# Patient Record
Sex: Female | Born: 1965 | Race: White | Hispanic: No | Marital: Married | Smoking: Never smoker
Health system: Southern US, Community
[De-identification: ages and names within clinical notes are randomized; demographics above are authoritative.]

## PROBLEM LIST (undated history)

## (undated) DIAGNOSIS — I350 Nonrheumatic aortic (valve) stenosis: Secondary | ICD-10-CM

## (undated) DIAGNOSIS — M1990 Unspecified osteoarthritis, unspecified site: Secondary | ICD-10-CM

## (undated) DIAGNOSIS — G4733 Obstructive sleep apnea (adult) (pediatric): Secondary | ICD-10-CM

## (undated) DIAGNOSIS — E7800 Pure hypercholesterolemia, unspecified: Secondary | ICD-10-CM

## (undated) DIAGNOSIS — I10 Essential (primary) hypertension: Secondary | ICD-10-CM

## (undated) DIAGNOSIS — R011 Cardiac murmur, unspecified: Secondary | ICD-10-CM

## (undated) DIAGNOSIS — Z9989 Dependence on other enabling machines and devices: Secondary | ICD-10-CM

## (undated) DIAGNOSIS — Z95810 Presence of automatic (implantable) cardiac defibrillator: Secondary | ICD-10-CM

## (undated) DIAGNOSIS — Q249 Congenital malformation of heart, unspecified: Secondary | ICD-10-CM

## (undated) DIAGNOSIS — I2699 Other pulmonary embolism without acute cor pulmonale: Secondary | ICD-10-CM

## (undated) HISTORY — PX: CARDIAC CATHETERIZATION: SHX172

## (undated) HISTORY — PX: AORTIC VALVE REPLACEMENT: SHX41

## (undated) HISTORY — PX: CARDIAC VALVE REPLACEMENT: SHX585

## (undated) HISTORY — DX: Essential (primary) hypertension: I10

---
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
## 2000-03-16 ENCOUNTER — Other Ambulatory Visit: Admission: RE | Admit: 2000-03-16 | Discharge: 2000-03-16 | Payer: Self-pay | Admitting: Obstetrics and Gynecology

## 2001-05-07 ENCOUNTER — Other Ambulatory Visit: Admission: RE | Admit: 2001-05-07 | Discharge: 2001-05-07 | Payer: Self-pay | Admitting: Obstetrics and Gynecology

## 2002-05-08 ENCOUNTER — Other Ambulatory Visit: Admission: RE | Admit: 2002-05-08 | Discharge: 2002-05-08 | Payer: Self-pay | Admitting: Obstetrics and Gynecology

## 2003-06-03 ENCOUNTER — Other Ambulatory Visit: Admission: RE | Admit: 2003-06-03 | Discharge: 2003-06-03 | Payer: Self-pay | Admitting: Obstetrics and Gynecology

## 2004-06-08 ENCOUNTER — Other Ambulatory Visit: Admission: RE | Admit: 2004-06-08 | Discharge: 2004-06-08 | Payer: Self-pay | Admitting: Obstetrics and Gynecology

## 2004-11-13 HISTORY — PX: AORTIC VALVE REPLACEMENT: SHX41

## 2004-11-25 ENCOUNTER — Ambulatory Visit (HOSPITAL_COMMUNITY): Admission: RE | Admit: 2004-11-25 | Discharge: 2004-11-25 | Payer: Self-pay | Admitting: Family Medicine

## 2005-06-01 ENCOUNTER — Inpatient Hospital Stay (HOSPITAL_COMMUNITY): Admission: RE | Admit: 2005-06-01 | Discharge: 2005-06-01 | Payer: Self-pay | Admitting: Obstetrics and Gynecology

## 2005-08-30 ENCOUNTER — Other Ambulatory Visit: Admission: RE | Admit: 2005-08-30 | Discharge: 2005-08-30 | Payer: Self-pay | Admitting: Obstetrics and Gynecology

## 2005-09-27 ENCOUNTER — Ambulatory Visit (HOSPITAL_COMMUNITY): Admission: RE | Admit: 2005-09-27 | Discharge: 2005-09-27 | Payer: Self-pay | Admitting: Obstetrics and Gynecology

## 2006-08-30 ENCOUNTER — Ambulatory Visit (HOSPITAL_COMMUNITY): Admission: RE | Admit: 2006-08-30 | Discharge: 2006-08-30 | Payer: Self-pay | Admitting: Obstetrics and Gynecology

## 2006-11-14 ENCOUNTER — Ambulatory Visit (HOSPITAL_COMMUNITY): Admission: RE | Admit: 2006-11-14 | Discharge: 2006-11-14 | Payer: Self-pay | Admitting: Obstetrics and Gynecology

## 2006-11-17 ENCOUNTER — Inpatient Hospital Stay (HOSPITAL_COMMUNITY): Admission: AD | Admit: 2006-11-17 | Discharge: 2006-11-20 | Payer: Self-pay | Admitting: Obstetrics and Gynecology

## 2013-03-05 ENCOUNTER — Other Ambulatory Visit: Payer: Self-pay | Admitting: Obstetrics and Gynecology

## 2013-11-13 DIAGNOSIS — Z95810 Presence of automatic (implantable) cardiac defibrillator: Secondary | ICD-10-CM | POA: Insufficient documentation

## 2013-11-13 DIAGNOSIS — I2699 Other pulmonary embolism without acute cor pulmonale: Secondary | ICD-10-CM | POA: Insufficient documentation

## 2013-11-13 HISTORY — PX: CARDIAC DEFIBRILLATOR PLACEMENT: SHX171

## 2013-11-13 HISTORY — DX: Presence of automatic (implantable) cardiac defibrillator: Z95.810

## 2013-11-13 HISTORY — PX: ANOMALOUS PULMONARY VENOUS RETURN REPAIR, TOTAL: SHX1156

## 2013-11-13 HISTORY — DX: Other pulmonary embolism without acute cor pulmonale: I26.99

## 2014-05-14 ENCOUNTER — Other Ambulatory Visit: Payer: Self-pay | Admitting: Obstetrics and Gynecology

## 2014-05-18 LAB — CYTOLOGY - PAP

## 2014-09-18 DIAGNOSIS — Z952 Presence of prosthetic heart valve: Secondary | ICD-10-CM | POA: Insufficient documentation

## 2014-09-18 DIAGNOSIS — I351 Nonrheumatic aortic (valve) insufficiency: Secondary | ICD-10-CM | POA: Insufficient documentation

## 2014-09-18 DIAGNOSIS — Z954 Presence of other heart-valve replacement: Secondary | ICD-10-CM

## 2014-09-18 HISTORY — DX: Nonrheumatic aortic (valve) insufficiency: I35.1

## 2014-09-18 HISTORY — DX: Presence of prosthetic heart valve: Z95.2

## 2014-10-28 DIAGNOSIS — I350 Nonrheumatic aortic (valve) stenosis: Secondary | ICD-10-CM | POA: Insufficient documentation

## 2014-10-28 HISTORY — DX: Nonrheumatic aortic (valve) stenosis: I35.0

## 2014-10-29 DIAGNOSIS — T82897A Other specified complication of cardiac prosthetic devices, implants and grafts, initial encounter: Secondary | ICD-10-CM

## 2014-10-29 HISTORY — DX: Other specified complication of cardiac prosthetic devices, implants and grafts, initial encounter: T82.897A

## 2014-11-03 ENCOUNTER — Emergency Department (HOSPITAL_COMMUNITY): Payer: BLUE CROSS/BLUE SHIELD

## 2014-11-03 ENCOUNTER — Inpatient Hospital Stay (HOSPITAL_COMMUNITY): Payer: BLUE CROSS/BLUE SHIELD

## 2014-11-03 ENCOUNTER — Inpatient Hospital Stay (HOSPITAL_COMMUNITY)
Admission: EM | Admit: 2014-11-03 | Discharge: 2014-11-03 | DRG: 297 | Disposition: A | Discharge status: Other Acute Inpatient Hospital | Payer: BLUE CROSS/BLUE SHIELD | Attending: Emergency Medicine | Admitting: Emergency Medicine

## 2014-11-03 ENCOUNTER — Encounter (HOSPITAL_COMMUNITY): Payer: Self-pay | Admitting: *Deleted

## 2014-11-03 DIAGNOSIS — Z452 Encounter for adjustment and management of vascular access device: Secondary | ICD-10-CM

## 2014-11-03 DIAGNOSIS — I469 Cardiac arrest, cause unspecified: Secondary | ICD-10-CM

## 2014-11-03 DIAGNOSIS — J9601 Acute respiratory failure with hypoxia: Secondary | ICD-10-CM

## 2014-11-03 DIAGNOSIS — E872 Acidosis: Secondary | ICD-10-CM | POA: Diagnosis present

## 2014-11-03 DIAGNOSIS — R0902 Hypoxemia: Secondary | ICD-10-CM | POA: Diagnosis present

## 2014-11-03 DIAGNOSIS — N390 Urinary tract infection, site not specified: Secondary | ICD-10-CM

## 2014-11-03 DIAGNOSIS — Z952 Presence of prosthetic heart valve: Secondary | ICD-10-CM | POA: Diagnosis not present

## 2014-11-03 DIAGNOSIS — J810 Acute pulmonary edema: Secondary | ICD-10-CM

## 2014-11-03 DIAGNOSIS — Z9289 Personal history of other medical treatment: Secondary | ICD-10-CM

## 2014-11-03 DIAGNOSIS — R4182 Altered mental status, unspecified: Secondary | ICD-10-CM | POA: Diagnosis present

## 2014-11-03 DIAGNOSIS — I4901 Ventricular fibrillation: Secondary | ICD-10-CM | POA: Diagnosis present

## 2014-11-03 HISTORY — DX: Congenital malformation of heart, unspecified: Q24.9

## 2014-11-03 HISTORY — DX: Cardiac arrest, cause unspecified: I46.9

## 2014-11-03 LAB — PROTIME-INR
INR: 1.15 (ref 0.00–1.49)
Prothrombin Time: 14.8 seconds (ref 11.6–15.2)

## 2014-11-03 LAB — COMPREHENSIVE METABOLIC PANEL
ALK PHOS: 76 U/L (ref 39–117)
ALT: 219 U/L — AB (ref 0–35)
AST: 327 U/L — AB (ref 0–37)
Albumin: 4 g/dL (ref 3.5–5.2)
Anion gap: 19 — ABNORMAL HIGH (ref 5–15)
BILIRUBIN TOTAL: 0.7 mg/dL (ref 0.3–1.2)
BUN: 17 mg/dL (ref 6–23)
CO2: 20 mmol/L (ref 19–32)
Calcium: 9.1 mg/dL (ref 8.4–10.5)
Chloride: 102 mEq/L (ref 96–112)
Creatinine, Ser: 1.45 mg/dL — ABNORMAL HIGH (ref 0.50–1.10)
GFR, EST AFRICAN AMERICAN: 49 mL/min — AB (ref >90)
GFR, EST NON AFRICAN AMERICAN: 42 mL/min — AB (ref >90)
Glucose, Bld: 267 mg/dL — ABNORMAL HIGH (ref 70–99)
POTASSIUM: 3.1 mmol/L — AB (ref 3.5–5.1)
Sodium: 141 mmol/L (ref 135–145)
TOTAL PROTEIN: 7 g/dL (ref 6.0–8.3)

## 2014-11-03 LAB — I-STAT TROPONIN, ED: TROPONIN I, POC: 0.28 ng/mL — AB (ref 0.00–0.08)

## 2014-11-03 LAB — I-STAT CHEM 8, ED
BUN: 17 mg/dL (ref 6–23)
CALCIUM ION: 1.08 mmol/L — AB (ref 1.12–1.23)
CREATININE: 1.3 mg/dL — AB (ref 0.50–1.10)
Chloride: 104 mEq/L (ref 96–112)
Glucose, Bld: 264 mg/dL — ABNORMAL HIGH (ref 70–99)
HEMATOCRIT: 40 % (ref 36.0–46.0)
Hemoglobin: 13.6 g/dL (ref 12.0–15.0)
POTASSIUM: 3.1 mmol/L — AB (ref 3.5–5.1)
SODIUM: 141 mmol/L (ref 135–145)
TCO2: 18 mmol/L (ref 0–100)

## 2014-11-03 LAB — I-STAT CG4 LACTIC ACID, ED: Lactic Acid, Venous: 7.66 mmol/L — ABNORMAL HIGH (ref 0.5–2.2)

## 2014-11-03 LAB — CBG MONITORING, ED: Glucose-Capillary: 198 mg/dL — ABNORMAL HIGH (ref 70–99)

## 2014-11-03 LAB — URINE MICROSCOPIC-ADD ON

## 2014-11-03 LAB — I-STAT ARTERIAL BLOOD GAS, ED
Acid-base deficit: 5 mmol/L — ABNORMAL HIGH (ref 0.0–2.0)
BICARBONATE: 24.5 meq/L — AB (ref 20.0–24.0)
O2 Saturation: 98 %
PCO2 ART: 66.1 mmHg — AB (ref 35.0–45.0)
PH ART: 7.181 — AB (ref 7.350–7.450)
Patient temperature: 100.1
TCO2: 26 mmol/L (ref 0–100)
pO2, Arterial: 128 mmHg — ABNORMAL HIGH (ref 80.0–100.0)

## 2014-11-03 LAB — URINALYSIS, ROUTINE W REFLEX MICROSCOPIC
BILIRUBIN URINE: NEGATIVE
GLUCOSE, UA: 250 mg/dL — AB
Ketones, ur: NEGATIVE mg/dL
Leukocytes, UA: NEGATIVE
NITRITE: NEGATIVE
PROTEIN: 100 mg/dL — AB
SPECIFIC GRAVITY, URINE: 1.015 (ref 1.005–1.030)
UROBILINOGEN UA: 1 mg/dL (ref 0.0–1.0)
pH: 5.5 (ref 5.0–8.0)

## 2014-11-03 LAB — CBC WITH DIFFERENTIAL/PLATELET
BASOS PCT: 1 % (ref 0–1)
Basophils Absolute: 0.1 10*3/uL (ref 0.0–0.1)
EOS ABS: 0 10*3/uL (ref 0.0–0.7)
EOS PCT: 0 % (ref 0–5)
HCT: 37.7 % (ref 36.0–46.0)
HEMOGLOBIN: 11.6 g/dL — AB (ref 12.0–15.0)
LYMPHS ABS: 5.3 10*3/uL — AB (ref 0.7–4.0)
Lymphocytes Relative: 22 % (ref 12–46)
MCH: 29.2 pg (ref 26.0–34.0)
MCHC: 30.8 g/dL (ref 30.0–36.0)
MCV: 95 fL (ref 78.0–100.0)
MONOS PCT: 7 % (ref 3–12)
Monocytes Absolute: 1.7 10*3/uL — ABNORMAL HIGH (ref 0.1–1.0)
NEUTROS ABS: 16.6 10*3/uL — AB (ref 1.7–7.7)
NEUTROS PCT: 70 % (ref 43–77)
PLATELETS: 437 10*3/uL — AB (ref 150–400)
RBC: 3.97 MIL/uL (ref 3.87–5.11)
RDW: 15.8 % — ABNORMAL HIGH (ref 11.5–15.5)
WBC: 23.6 10*3/uL — ABNORMAL HIGH (ref 4.0–10.5)

## 2014-11-03 MED ORDER — PROPOFOL 10 MG/ML IV EMUL
INTRAVENOUS | Status: AC
  Filled 2014-11-03: qty 100

## 2014-11-03 MED ORDER — DEXTROSE 5 % IV SOLN
2.0000 g | Freq: Once | INTRAVENOUS | Status: AC
  Administered 2014-11-03: 2 g via INTRAVENOUS
  Filled 2014-11-03: qty 2

## 2014-11-03 MED ORDER — PROPOFOL 10 MG/ML IV EMUL
5.0000 ug/kg/min | INTRAVENOUS | Status: DC
  Administered 2014-11-03: 20 ug/kg/min via INTRAVENOUS

## 2014-11-03 MED ORDER — ROCURONIUM BROMIDE 50 MG/5ML IV SOLN
INTRAVENOUS | Status: AC
  Filled 2014-11-03: qty 2

## 2014-11-03 MED ORDER — LIDOCAINE HCL (CARDIAC) 20 MG/ML IV SOLN
INTRAVENOUS | Status: AC
  Filled 2014-11-03: qty 5

## 2014-11-03 MED ORDER — ETOMIDATE 2 MG/ML IV SOLN
INTRAVENOUS | Status: AC | PRN
  Administered 2014-11-03: 20 mg via INTRAVENOUS
  Administered 2014-11-03: 10 mg via INTRAVENOUS
  Administered 2014-11-03: 30 mg via INTRAVENOUS

## 2014-11-03 MED ORDER — POTASSIUM CHLORIDE 10 MEQ/100ML IV SOLN: 10.0000 meq | Freq: Once | INTRAVENOUS | Status: DC

## 2014-11-03 MED ORDER — VANCOMYCIN HCL 10 G IV SOLR
2000.0000 mg | Freq: Once | INTRAVENOUS | Status: AC
  Administered 2014-11-03: 2000 mg via INTRAVENOUS
  Filled 2014-11-03: qty 2000

## 2014-11-03 MED ORDER — SUCCINYLCHOLINE CHLORIDE 20 MG/ML IJ SOLN
INTRAMUSCULAR | Status: AC
  Filled 2014-11-03: qty 1

## 2014-11-03 MED ORDER — ETOMIDATE 2 MG/ML IV SOLN
INTRAVENOUS | Status: AC
  Filled 2014-11-03: qty 20

## 2014-11-03 MED ORDER — ROCURONIUM BROMIDE 50 MG/5ML IV SOLN
INTRAVENOUS | Status: AC | PRN
  Administered 2014-11-03 (×2): 100 mg via INTRAVENOUS

## 2014-11-03 MED ORDER — PROPOFOL 10 MG/ML IV EMUL
5.0000 ug/kg/min | INTRAVENOUS | Status: DC
  Administered 2014-11-03: 10 ug/kg/min via INTRAVENOUS

## 2014-11-03 MED ORDER — PROPOFOL 10 MG/ML IV EMUL
INTRAVENOUS | Status: DC
Start: 2014-11-03 — End: 2014-11-03
  Filled 2014-11-03: qty 100

## 2014-11-03 MED ORDER — SODIUM CHLORIDE 0.9 % IV SOLN
25.0000 ug/h | INTRAVENOUS | Status: DC
  Administered 2014-11-03: 50 ug/h via INTRAVENOUS
  Filled 2014-11-03: qty 50

## 2014-11-03 NOTE — Progress Notes (Signed)
Patient placed on transport vent by EMS.

## 2014-11-03 NOTE — Code Documentation (Signed)
CBG 198 

## 2014-11-03 NOTE — Progress Notes (Signed)
Responded to referral from oon call night chaplain to continue support to patient that can to ED as post CPR. Patient is intubated and still critical. Family was escorted to consultation room and is rotating visits with patient.  Family has requested that patient be sent to [facility].  Bed placement pending. Family is very cooperative and supportive to staff. Provided ministry of presence, empathic listening, emotional and spiritual support and hospitality.  Will follow as needed.   11/03/14 1100  Clinical Encounter Type  Visited With Patient;Family;Patient and family together;Health care provider  Visit Type Follow-up;Spiritual support;Critical Care;ED;Trauma  Referral From Chaplain  Spiritual Encounters  Spiritual Needs Prayer;Emotional  Stress Factors  Family Stress Factors Exhausted;Health changes;Major life changes  Jaclynn Major, [place]

## 2014-11-03 NOTE — ED Notes (Signed)
Troponin results given to Dr. Alvino Chapel

## 2014-11-03 NOTE — ED Notes (Signed)
Family rotating through with chaplin.

## 2014-11-03 NOTE — ED Notes (Signed)
30mcg bolus given per dr Nelda Marseille

## 2014-11-03 NOTE — Code Documentation (Addendum)
Per EMS- pt was unwitness arrest. Family started CPR at approx 0700. Pt was last seen at 630 today. Pt received 200j at 718 for v fib with EMS the 300j pulses returned with NSR at 0727

## 2014-11-03 NOTE — Procedures (Signed)
Arterial Catheter Insertion Procedure Note KEYIRA MONDESIR [id number] 1966/07/12  Procedure: Insertion of Arterial Catheter  Indications: Blood pressure monitoring and Frequent blood sampling  Procedure Details Consent: Unable to obtain consent because of emergent medical necessity. Time Out: Verified patient identification, verified procedure, site/side was marked, verified correct patient position, special equipment/implants available, medications/allergies/relevent history reviewed, required imaging and test results available.  Performed  Maximum sterile technique was used including antiseptics, cap, gloves, gown, hand hygiene, mask and sheet. Skin prep: Chlorhexidine; local anesthetic administered 20 gauge catheter was inserted into left radial artery using the Seldinger technique.  Evaluation Blood flow good; BP tracing good. Complications: No apparent complications.   Jennet Maduro 11/03/2014

## 2014-11-03 NOTE — H&P (Signed)
Called to admit patient to ICU, patient's family wishes for patient to be transferred to [facility].  Evidently patient was doing ok last night but has been rather debilitated since her valve replacement last week in [facility].  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  This AM patient was noted to be agonally breathing and CPR was started by husband at home.  EMS was called and patient was not intubated.  Patient was brought to the hospital.  Was very agitated, not purposeful and was intubated.  I spoke with the family, explained situation in details and husband requested transfer to [facility].  Neuro exam bedside was that of a patient who was paralyzed.  Pupils were reactive but patient was not moving anything.  Lungs with diffuse crackles.  Heart sounds consistent with an aortic valve replacement.  Will place central line and a-line and EDP is to attempt transfer to [facility].  If unable to acquire a bed then will admit to [facility].  The patient is critically ill with multiple organ systems failure and requires high complexity decision making for assessment and support, frequent evaluation and titration of therapies, application of advanced monitoring technologies and extensive interpretation of multiple databases.   Critical Care Time devoted to patient care services described in this note is 45 Minutes. This time reflects time of care of this signee Dr Jennet Maduro. This critical care time does not reflect procedure time, or teaching time or supervisory time of PA/NP/Med student/Med Resident etc but could involve care discussion time.  Rush Farmer, M.D. [facility] Pulmonary/Critical Care Medicine. Pager: [phone]. After hours pager: [phone].

## 2014-11-03 NOTE — ED Notes (Signed)
Critical care MD at bedside 

## 2014-11-03 NOTE — ED Notes (Signed)
Notified RN of CBG 198

## 2014-11-03 NOTE — ED Notes (Signed)
Lactic acid results given to Dr. Pickering 

## 2014-11-03 NOTE — Code Documentation (Signed)
Pt continued to be bagged in preparation for intubation.

## 2014-11-03 NOTE — Code Documentation (Addendum)
Pt on nrb at this time with NPA in place. Respiratory at bedside. Pt moving all extremities. Response to painful stimuli. Pt arrived on LSB.

## 2014-11-03 NOTE — ED Notes (Signed)
Dr.Pickering made aware that receiving RN was asking if potassium could be given. MD states not at this time.

## 2014-11-03 NOTE — Code Documentation (Signed)
No response to initial doses of medications.

## 2014-11-03 NOTE — Progress Notes (Signed)
ANTIBIOTIC CONSULT NOTE - INITIAL  Pharmacy Consult for vancomycin and cefepime Indication: rule out sepsis  Allergies not on file  Patient Measurements: Weight: 300 lb (136.079 kg)   Vital Signs: Temp: 100.1 F (37.8 C) (12/22 0904) Temp Source: Rectal (12/22 0904) BP: 167/89 mmHg (12/22 0915) Pulse Rate: 115 (12/22 0929) Intake/Output from previous day:   Intake/Output from this shift:    Labs:  Recent Labs  11/03/14 0850 11/03/14 0903  WBC 23.6*  --   HGB 11.6* 13.6  PLT 437*  --   CREATININE 1.45* 1.30*   CrCl cannot be calculated (Unknown ideal weight.). No results for input(s): VANCOTROUGH, VANCOPEAK, VANCORANDOM, GENTTROUGH, GENTPEAK, GENTRANDOM, TOBRATROUGH, TOBRAPEAK, TOBRARND, AMIKACINPEAK, AMIKACINTROU, AMIKACIN in the last 72 hours.   Microbiology: No results found for this or any previous visit (from the past 720 hour(s)).  Medical History: No past medical history on file.  Medications:  See med hiastory Assessment: 48 yo lady admitted after unwitnessed arrest to start broad spectrum antibiotics r/o sepsis.  Tmax 100.1, WBC 23.6  LA 7.66, Trop 0.28  Goal of Therapy:  Vancomycin trough level 15-20 mcg/ml  Plan:  Cefepime 2 gm IV q24 hours Vancomycin 2 gm IV X 1 then 1500 mg IV q24 hours F/u renal function, cultures and clinical course  Thanks for allowing pharmacy to be a part of this patient's care.  Excell Seltzer, PharmD Clinical Pharmacist, [phone] 11/03/2014,9:45 AM

## 2014-11-03 NOTE — ED Notes (Signed)
Heather Mosley with critical care at bedside 

## 2014-11-03 NOTE — Procedures (Signed)
Central Venous Catheter Insertion Procedure Note Heather Mosley [id number] 12-May-1966  Procedure: Insertion of Central Venous Catheter Indications: Assessment of intravascular volume, Drug and/or fluid administration and Frequent blood sampling  Procedure Details Consent: Unable to obtain consent because of emergent medical necessity. Time Out: Verified patient identification, verified procedure, site/side was marked, verified correct patient position, special equipment/implants available, medications/allergies/relevent history reviewed, required imaging and test results available.  Performed  Maximum sterile technique was used including antiseptics, cap, gloves, gown, hand hygiene, mask and sheet. Skin prep: Chlorhexidine; local anesthetic administered A antimicrobial bonded/coated triple lumen catheter was placed in the left subclavian vein using the Seldinger technique.  Evaluation Blood flow good Complications: No apparent complications Patient did tolerate procedure well. Chest X-ray ordered to verify placement.  CXR: normal.  U/S used in placement.  Gracie Gupta 11/03/2014, 10:28 AM

## 2014-11-03 NOTE — ED Provider Notes (Signed)
CSN: [id number]     Arrival date & time 11/03/14  0820 History   First MD Initiated Contact with Patient 11/03/14 0840     Chief Complaint  Patient presents with  . post cardiac arrest     Level V caveat due to altered mental status. (Consider location/radiation/quality/duration/timing/severity/associated sxs/prior Treatment) The history is provided by the patient and the EMS personnel.   patient has had a recent aortic valve replacement at [facility] around 5 days ago. This morning she was normal at 6:30 and at 7:00 family found her without a pulse. EMS arrived around 20 minutes later and found to be in ventricular fibrillation. She required 2 shocks and did have return of vitals around 728. Upon arrival to the ER she was somewhat combative with pupils somewhat dilated and sluggish. She would not follow commands and would withdraw from pain. Initially good pulse ox and in sinus tachycardia.  Past Medical History  Diagnosis Date  . Congenital heart defect    Past Surgical History  Procedure Laterality Date  . Aortic valve surgery     No family history on file. History  Substance Use Topics  . Smoking status: Not on file  . Smokeless tobacco: Not on file  . Alcohol Use: Not on file   OB History    No data available     Review of Systems  Unable to perform ROS     Allergies  Tape  Home Medications   Prior to Admission medications   Not on File   BP 167/89 mmHg  Pulse 116  Temp(Src) 100.1 F (37.8 C) (Rectal)  Resp 20  Wt 300 lb (136.079 kg)  SpO2 94% Physical Exam  Constitutional: She appears well-developed and well-nourished.  HENT:  Head: Atraumatic.  Eyes:  Pupils somewhat dilated and sluggish.  Neck:  Band-Aid to right sided neck over slight ecchymosis, likely site of TAVR  Cardiovascular:  Tachycardia  Pulmonary/Chest:  Transmitted upper airway sounds equal bilaterally.  Abdominal: There is no tenderness.  Patient is obese  Musculoskeletal:  She exhibits no edema.  Neurological:  Patient is somewhat combative. Withdraws from pain. Is not follow commands and is nonverbal.  Skin: Skin is warm.    ED Course  Procedures (including critical care time) Labs Review Labs Reviewed  CBC WITH DIFFERENTIAL - Abnormal; Notable for the following:    WBC 23.6 (*)    Hemoglobin 11.6 (*)    RDW 15.8 (*)    Platelets 437 (*)    Neutro Abs 16.6 (*)    Lymphs Abs 5.3 (*)    Monocytes Absolute 1.7 (*)    All other components within normal limits  COMPREHENSIVE METABOLIC PANEL - Abnormal; Notable for the following:    Potassium 3.1 (*)    Glucose, Bld 267 (*)    Creatinine, Ser 1.45 (*)    AST 327 (*)    ALT 219 (*)    GFR calc non Af Amer 42 (*)    GFR calc Af Amer 49 (*)    Anion gap 19 (*)    All other components within normal limits  URINALYSIS, ROUTINE W REFLEX MICROSCOPIC - Abnormal; Notable for the following:    APPearance TURBID (*)    Glucose, UA 250 (*)    Hgb urine dipstick MODERATE (*)    Protein, ur 100 (*)    All other components within normal limits  URINE MICROSCOPIC-ADD ON - Abnormal; Notable for the following:    Squamous Epithelial / LPF  FEW (*)    Bacteria, UA MANY (*)    Casts GRANULAR CAST (*)    All other components within normal limits  I-STAT CHEM 8, ED - Abnormal; Notable for the following:    Potassium 3.1 (*)    Creatinine, Ser 1.30 (*)    Glucose, Bld 264 (*)    Calcium, Ion 1.08 (*)    All other components within normal limits  I-STAT TROPOININ, ED - Abnormal; Notable for the following:    Troponin i, poc 0.28 (*)    All other components within normal limits  I-STAT CG4 LACTIC ACID, ED - Abnormal; Notable for the following:    Lactic Acid, Venous 7.66 (*)    All other components within normal limits  CBG MONITORING, ED - Abnormal; Notable for the following:    Glucose-Capillary 198 (*)    All other components within normal limits  I-STAT ARTERIAL BLOOD GAS, ED - Abnormal; Notable for the  following:    pH, Arterial 7.181 (*)    pCO2 arterial 66.1 (*)    pO2, Arterial 128.0 (*)    Bicarbonate 24.5 (*)    Acid-base deficit 5.0 (*)    All other components within normal limits  CULTURE, BLOOD (ROUTINE X 2)  CULTURE, BLOOD (ROUTINE X 2)  PROTIME-INR  BLOOD GAS, ARTERIAL    Imaging Review Dg Chest Portable 1 View  11/03/2014   CLINICAL DATA:  Cardiac arrest  EXAM: PORTABLE CHEST - 1 VIEW  COMPARISON:  09/11/2014  FINDINGS: Endotracheal tube in good position.  NG tube in the stomach  Cardiac enlargement. Diffuse bilateral airspace disease most likely pulmonary edema. Negative for effusion.  IMPRESSION: Endotracheal tube in good position.  Diffuse pulmonary edema.   Electronically Signed   By: Franchot Gallo M.D.   On: 11/03/2014 09:20     EKG Interpretation None      MDM   Final diagnoses:  Cardiac arrest    Patient presented after cardiac arrest. Had return of vitals after V. fib arrest. Combative and required intubation by myself. Critical care consulted. Cefepime and vancomycin started for elevated white count and temperature 100.1. Urinalysis later showed UTI. Patient is acidotic. Discussed with family members. After line placement Vicryl care the request transfer to [facility]. I discussed with several different cardiologist there and will be transferred to the CCU. No antiarrhythmic started at this time. Is not started on hypothermia protocol after discussion with critical care. Potassium supplementation have been ordered, however we'll not delay the transfer for it.  INTUBATION Performed by: Mackie Pai  Required items: required blood products, implants, devices, and special equipment available Patient identity confirmed: provided demographic data and hospital-assigned identification number Time out: Immediately prior to procedure a "time out" was called to verify the correct patient, procedure, equipment, support staff and site/side marked as  required.  Indications: Altered mental status   Intubation method: Glidescope Laryngoscopy   Preoxygenation: BVM  Sedatives: Etomidate Paralytic: Roccuronium  hypoxia before intubation and after induction that resolved with bagging.  Tube Size: 7.5 cuffed  Post-procedure assessment: chest rise and ETCO2 monitor Breath sounds: equal and absent over the epigastrium Tube secured with: ETT holder Chest x-ray interpreted by radiologist and me.  Chest x-ray findings:  *endotracheal tube in appropriate position  Patient tolerated the procedure well with no immediate complications.  CRITICAL CARE Performed by: Mackie Pai Total critical care time: 45 Critical care time was exclusive of separately billable procedures and treating other patients. Critical care was necessary  to treat or prevent imminent or life-threatening deterioration. Critical care was time spent personally by me on the following activities: development of treatment plan with patient and/or surrogate as well as nursing, discussions with consultants, evaluation of patient's response to treatment, examination of patient, obtaining history from patient or surrogate, ordering and performing treatments and interventions, ordering and review of laboratory studies, ordering and review of radiographic studies, pulse oximetry and re-evaluation of patient's condition.       Jasper Riling. Alvino Chapel, MD 11/03/14 [phone]

## 2014-11-05 LAB — URINE CULTURE
COLONY COUNT: NO GROWTH
CULTURE: NO GROWTH

## 2014-11-09 DIAGNOSIS — Z95810 Presence of automatic (implantable) cardiac defibrillator: Secondary | ICD-10-CM | POA: Insufficient documentation

## 2014-11-09 DIAGNOSIS — I5023 Acute on chronic systolic (congestive) heart failure: Secondary | ICD-10-CM | POA: Insufficient documentation

## 2014-11-09 DIAGNOSIS — Z9889 Other specified postprocedural states: Secondary | ICD-10-CM | POA: Insufficient documentation

## 2014-11-09 HISTORY — DX: Acute on chronic systolic (congestive) heart failure: I50.23

## 2014-11-09 HISTORY — DX: Presence of automatic (implantable) cardiac defibrillator: Z95.810

## 2014-11-09 HISTORY — DX: Other specified postprocedural states: Z98.890

## 2014-11-09 LAB — CULTURE, BLOOD (ROUTINE X 2)
Culture: NO GROWTH
Culture: NO GROWTH

## 2015-06-02 DIAGNOSIS — E669 Obesity, unspecified: Secondary | ICD-10-CM | POA: Insufficient documentation

## 2015-06-02 HISTORY — DX: Obesity, unspecified: E66.9

## 2015-06-21 ENCOUNTER — Other Ambulatory Visit: Payer: Self-pay | Admitting: Obstetrics and Gynecology

## 2015-06-22 LAB — CYTOLOGY - PAP

## 2016-05-24 DIAGNOSIS — Z95810 Presence of automatic (implantable) cardiac defibrillator: Secondary | ICD-10-CM

## 2016-05-24 HISTORY — DX: Presence of automatic (implantable) cardiac defibrillator: Z95.810

## 2016-09-29 ENCOUNTER — Other Ambulatory Visit: Payer: Self-pay | Admitting: Obstetrics and Gynecology

## 2016-10-02 LAB — CYTOLOGY - PAP

## 2016-11-15 DIAGNOSIS — G5601 Carpal tunnel syndrome, right upper limb: Secondary | ICD-10-CM | POA: Diagnosis not present

## 2016-11-15 DIAGNOSIS — M25562 Pain in left knee: Secondary | ICD-10-CM | POA: Diagnosis not present

## 2016-11-15 DIAGNOSIS — G4733 Obstructive sleep apnea (adult) (pediatric): Secondary | ICD-10-CM | POA: Diagnosis not present

## 2016-11-22 DIAGNOSIS — Z1231 Encounter for screening mammogram for malignant neoplasm of breast: Secondary | ICD-10-CM | POA: Diagnosis not present

## 2016-11-23 DIAGNOSIS — I469 Cardiac arrest, cause unspecified: Secondary | ICD-10-CM | POA: Diagnosis not present

## 2016-11-23 DIAGNOSIS — Z95810 Presence of automatic (implantable) cardiac defibrillator: Secondary | ICD-10-CM | POA: Diagnosis not present

## 2016-12-25 DIAGNOSIS — M1711 Unilateral primary osteoarthritis, right knee: Secondary | ICD-10-CM | POA: Diagnosis not present

## 2017-01-08 DIAGNOSIS — M1711 Unilateral primary osteoarthritis, right knee: Secondary | ICD-10-CM | POA: Diagnosis not present

## 2017-01-31 DIAGNOSIS — Z95810 Presence of automatic (implantable) cardiac defibrillator: Secondary | ICD-10-CM | POA: Diagnosis not present

## 2017-01-31 DIAGNOSIS — Z952 Presence of prosthetic heart valve: Secondary | ICD-10-CM | POA: Diagnosis not present

## 2017-02-19 DIAGNOSIS — L03031 Cellulitis of right toe: Secondary | ICD-10-CM | POA: Diagnosis not present

## 2017-02-20 DIAGNOSIS — Z952 Presence of prosthetic heart valve: Secondary | ICD-10-CM | POA: Diagnosis not present

## 2017-02-20 DIAGNOSIS — Z95810 Presence of automatic (implantable) cardiac defibrillator: Secondary | ICD-10-CM | POA: Diagnosis not present

## 2017-03-07 DIAGNOSIS — Z45018 Encounter for adjustment and management of other part of cardiac pacemaker: Secondary | ICD-10-CM | POA: Diagnosis not present

## 2017-03-07 DIAGNOSIS — Z95810 Presence of automatic (implantable) cardiac defibrillator: Secondary | ICD-10-CM | POA: Diagnosis not present

## 2017-03-07 DIAGNOSIS — I469 Cardiac arrest, cause unspecified: Secondary | ICD-10-CM | POA: Diagnosis not present

## 2017-03-12 DIAGNOSIS — G4733 Obstructive sleep apnea (adult) (pediatric): Secondary | ICD-10-CM | POA: Diagnosis not present

## 2017-03-20 ENCOUNTER — Other Ambulatory Visit: Payer: Self-pay | Admitting: Orthopedic Surgery

## 2017-03-20 DIAGNOSIS — G8929 Other chronic pain: Secondary | ICD-10-CM | POA: Insufficient documentation

## 2017-03-20 DIAGNOSIS — R52 Pain, unspecified: Secondary | ICD-10-CM

## 2017-03-20 DIAGNOSIS — M25561 Pain in right knee: Secondary | ICD-10-CM | POA: Diagnosis not present

## 2017-03-20 DIAGNOSIS — M170 Bilateral primary osteoarthritis of knee: Secondary | ICD-10-CM | POA: Diagnosis not present

## 2017-03-20 DIAGNOSIS — M25562 Pain in left knee: Secondary | ICD-10-CM | POA: Diagnosis not present

## 2017-03-20 HISTORY — DX: Other chronic pain: G89.29

## 2017-03-27 DIAGNOSIS — L600 Ingrowing nail: Secondary | ICD-10-CM | POA: Diagnosis not present

## 2017-04-04 DIAGNOSIS — I469 Cardiac arrest, cause unspecified: Secondary | ICD-10-CM | POA: Diagnosis not present

## 2017-04-10 ENCOUNTER — Ambulatory Visit (HOSPITAL_COMMUNITY): Payer: BLUE CROSS/BLUE SHIELD

## 2017-04-19 DIAGNOSIS — M2391 Unspecified internal derangement of right knee: Secondary | ICD-10-CM | POA: Diagnosis not present

## 2017-04-19 DIAGNOSIS — G8929 Other chronic pain: Secondary | ICD-10-CM | POA: Diagnosis not present

## 2017-04-19 DIAGNOSIS — M1711 Unilateral primary osteoarthritis, right knee: Secondary | ICD-10-CM | POA: Diagnosis not present

## 2017-06-06 ENCOUNTER — Ambulatory Visit (INDEPENDENT_AMBULATORY_CARE_PROVIDER_SITE_OTHER): Payer: 59 | Admitting: Cardiology

## 2017-06-06 ENCOUNTER — Encounter: Payer: Self-pay | Admitting: Cardiology

## 2017-06-06 VITALS — BP 110/76 | HR 88 | Resp 12 | Ht 61.0 in | Wt 318.0 lb

## 2017-06-06 DIAGNOSIS — Z8674 Personal history of sudden cardiac arrest: Secondary | ICD-10-CM | POA: Diagnosis not present

## 2017-06-06 DIAGNOSIS — Z952 Presence of prosthetic heart valve: Secondary | ICD-10-CM | POA: Diagnosis not present

## 2017-06-06 DIAGNOSIS — Z01812 Encounter for preprocedural laboratory examination: Secondary | ICD-10-CM | POA: Diagnosis not present

## 2017-06-06 DIAGNOSIS — Z95810 Presence of automatic (implantable) cardiac defibrillator: Secondary | ICD-10-CM

## 2017-06-06 DIAGNOSIS — Z954 Presence of other heart-valve replacement: Secondary | ICD-10-CM | POA: Diagnosis not present

## 2017-06-06 DIAGNOSIS — I209 Angina pectoris, unspecified: Secondary | ICD-10-CM

## 2017-06-06 DIAGNOSIS — Z86711 Personal history of pulmonary embolism: Secondary | ICD-10-CM | POA: Diagnosis not present

## 2017-06-06 HISTORY — DX: Personal history of pulmonary embolism: Z86.711

## 2017-06-06 HISTORY — DX: Personal history of sudden cardiac arrest: Z86.74

## 2017-06-06 MED ORDER — RANOLAZINE ER 500 MG PO TB12: 500.0000 mg | ORAL_TABLET | Freq: Two times a day (BID) | ORAL | 6 refills | Status: DC

## 2017-06-06 NOTE — Patient Instructions (Addendum)
Medication Instructions:  Your physician recommends that you continue on your current medications as directed. Please refer to the Current Medication list given to you today.  Labwork: Your physician recommends that you have labs drawn today for your upcoming heart cath on June 12, 2017 with Dr. Ellyn Hack.   Testing/Procedures: Your physician has requested that you have a cardiac catheterization. Cardiac catheterization is used to diagnose and/or treat various heart conditions. Doctors may recommend this procedure for a number of different reasons. The most common reason is to evaluate chest pain. Chest pain can be a symptom of coronary artery disease (CAD), and cardiac catheterization can show whether plaque is narrowing or blocking your heart's arteries. This procedure is also used to evaluate the valves, as well as measure the blood flow and oxygen levels in different parts of your heart. For further information please visit [email or web address]. Please follow instruction sheet, as given.  Follow-Up: Your physician recommends that you schedule a follow-up appointment in: 2-3 weeks after your heart cath.     Your provider has recommended a cardiac catherization  You are scheduled for a cardiac catheterization on July 31,2018 with Dr. Ellyn Hack or associate.  Please arrive at the [facility] (Main Entrance) at [facility] at [address] [place] on July 31,2018 at 5:30 a.m.    Special note: Every effort is made to have your procedure done on time.   Please understand that emergencies sometimes delay a scheduled   procedure.  No food or drink after midnight on June 11, 2017. On the morning of your procedure, take your aspirin.   You may take your morning medications with a sip of water on the day of your procedure.  Medications to HOLD - Lasix, Potassium, Celebrex.   Plan for a one night stay -- bring personal belongings.  Bring a current list of your  medications and current insurance cards.  You MUST have a responsible person to drive you home. Someone MUST be with you the first 24 hours after you arrive home or your discharge will be delayed. Wear clothes that are easy to get on and off and wear slip on shoes.    Coronary Angiogram A coronary angiogram, also called coronary angiography, is an X-ray procedure used to look at the arteries in the heart. In this procedure, a dye (contrast dye) is injected through a long, hollow tube (catheter). The catheter is about the size of a piece of cooked spaghetti and is inserted through your groin, wrist, or arm. The dye is injected into each artery, and X-rays are then taken to show if there is a blockage in the arteries of your heart.  LET [facility] CARE PROVIDER KNOW ABOUT:  Any allergies you have, including allergies to shellfish or contrast dye.   All medicines you are taking, including vitamins, herbs, eye drops, creams, and over-the-counter medicines.   Previous problems you or members of your family have had with the use of anesthetics.   Any blood disorders you have.   Previous surgeries you have had.  History of kidney problems or failure.   Other medical conditions you have.  RISKS AND COMPLICATIONS  Generally, a coronary angiogram is a safe procedure. However, about 1 person out of 1000 can have problems that may include:  Allergic reaction to the dye.  Bleeding/bruising from the access site or other locations.  Kidney injury, especially in people with impaired kidney function.  Stroke (rare).  Heart attack (  rare).  Irregular rhythms (rare)  Death (rare)  BEFORE THE PROCEDURE   Do not eat or drink anything after midnight the night before the procedure or as directed by your health care provider.   Ask your health care provider about changing or stopping your regular medicines. This is especially important if you are taking diabetes medicines or blood  thinners.  PROCEDURE  You may be given a medicine to help you relax (sedative) before the procedure. This medicine is given through an intravenous (IV) access tube that is inserted into one of your veins.   The area where the catheter will be inserted will be washed and shaved. This is usually done in the groin but may be done in the fold of your arm (near your elbow) or in the wrist.   A medicine will be given to numb the area where the catheter will be inserted (local anesthetic).   The health care provider will insert the catheter into an artery. The catheter will be guided by using a special type of X-ray (fluoroscopy) of the blood vessel being examined.   A special dye will then be injected into the catheter, and X-rays will be taken. The dye will help to show where any narrowing or blockages are located in the heart arteries.    AFTER THE PROCEDURE   If the procedure is done through the leg, you will be kept in bed lying flat for several hours. You will be instructed to not bend or cross your legs.  The insertion site will be checked frequently.   The pulse in your feet or wrist will be checked frequently.   Additional blood tests, X-rays, and an electrocardiogram may be done.       If you need a refill on your cardiac medications before your next appointment, please call your pharmacy.

## 2017-06-06 NOTE — Progress Notes (Signed)
Cardiology Office Note:    Date:  06/06/2017   ID:  Heather Mosley, DOB May 17, 1966, MRN [id number]  PCP:  Mateo Flow, MD  Cardiologist:  Jenne Campus, MD    Referring MD: No ref. provider found   Chief Complaint  Patient presents with  . Follow-up  Chief complaint is some having exertional chest pain.  History of Present Illness:    Heather Mosley is a 51 y.o. female  with very complex past medical history.She initially underwent aortic valve commissurotomy in 1985 with subsequent aortic root replacement with a porcine valve and ascending aorta and hemiarch replacement in 2006. She subsequently developed severe symptomatic prosthetic aortic valve regurgitation and underwent successful valve and valve transcatheter aortic valve replacement with a 26 mm CoreValve Evolut valve in December of 2015. She comes today to my office and complaints of exertional chest pain. That being going on for about one month or 2. Intermittent fact when she walks she will get tightness in the chest she stops the sensation goes away. She did have cardiac catheterization 2015 which showed normal coronaries. However, her symptoms are very worrisome. We'll discuss options today in terms of management of this problem. We are talking about doing a stress testing however with her body weight and body habitus quality will be at least suboptimal. We were talking about potential medical therapy however realistically speaking cardiac catheterization will be in my. The best option to evaluate this problem. I think it will be especially important to orifices of coronary arteries. I think it will be also appropriate to do aortogram. I explain proceeded to [facility] including all risks benefits and alternatives as well as potential complications. She is willing to proceed.       Past Medical History:  Diagnosis Date  . Congenital heart defect   . Hypertension     Past Surgical History:  Procedure Laterality Date  .  AORTIC VALVE REPAIR    . AORTIC VALVE SURGERY     TAVER Valve, And aortic  . CESAREAN SECTION    . INSERT / REPLACE / REMOVE PACEMAKER     ICD    Current Medications: Current Meds  Medication Sig  . aspirin EC 81 MG tablet Take 81 mg by mouth daily.  Marland Kitchen atorvastatin (LIPITOR) 80 MG tablet Take 1 tablet by mouth daily.  . celecoxib (CELEBREX) 200 MG capsule Take 200 mg by mouth daily.  . DOCOSAHEXAENOIC ACID PO Take 2 g by mouth daily.  . fenofibrate (TRICOR) 48 MG tablet Take 1 tablet by mouth daily.  . furosemide (LASIX) 40 MG tablet Take 60 mg by mouth 2 (two) times daily.  . mometasone (NASONEX) 50 MCG/ACT nasal spray Place 2 sprays into the nose daily.  . potassium chloride SA (K-DUR,KLOR-CON) 20 MEQ tablet Take 1 tablet by mouth daily.     Allergies:   Tape   Social History   Social History  . Marital status: Married    Spouse name: N/A  . Number of children: N/A  . Years of education: N/A   Social History Main Topics  . Smoking status: Never Smoker  . Smokeless tobacco: Never Used  . Alcohol use No  . Drug use: No  . Sexual activity: Not Asked   Other Topics Concern  . None   Social History Narrative  . None     Family History: The patient's family history includes Hyperlipidemia in her father; Hypertension in her mother; Leukemia in her father. ROS:  Please see the history of present illness.    All 14 point review of systems negative except as described per history of present illness  EKGs/Labs/Other Studies Reviewed:      Recent Labs: No results found for requested labs within last 8760 hours.  Recent Lipid Panel No results found for: CHOL, TRIG, HDL, CHOLHDL, VLDL, LDLCALC, LDLDIRECT  Physical Exam:    VS:  BP 110/76   Pulse 88   Resp 12   Ht 5\' 1"  (1.549 m)   Wt (!) 318 lb (144.2 kg)   BMI 60.09 kg/m     Wt Readings from Last 3 Encounters:  06/06/17 (!) 318 lb (144.2 kg)  11/03/14 300 lb (136.1 kg)     GEN:  Well nourished, well  developed in no acute distress HEENT: Normal NECK: No JVD; No carotid bruits LYMPHATICS: No lymphadenopathy CARDIAC: RRR, There is systolic ejection murmur grade 2-3/6 best heard right upper portion of the sternum, appears to be late peaking, appears to be louder than before, no rubs, no gallops RESPIRATORY:  Clear to auscultation without rales, wheezing or rhonchi  ABDOMEN: Soft, non-tender, non-distended MUSCULOSKELETAL:  No edema; No deformity  SKIN: Warm and dry LOWER EXTREMITIES: no swelling NEUROLOGIC:  Alert and oriented x 3 PSYCHIATRIC:  Normal affect   ASSESSMENT:    1. 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 S/P ICD (internal cardiac defibrillator) procedure   3. ICD (implantable cardioverter-defibrillator) in place   4. History of pulmonary embolism    PLAN:    In order of problems listed above:  Chest pain very worrisome for exertional angina. She is already on aspirin which I will continue, I will give ranolazine 500 mg twice daily, I told her that she may need to go to the emergency room if pain does not go away with rest. I also asked her not to exert herself on to will do cardiac catheterization.  Status post ICD: That being followed by group in [facility]. She see them over there every year and then does have remote monitoring History of pulmonary emboli. She completed her course of anticoagulation and doing well from that point review. I will retrieve her echocardiogram from our old office. Last echocardiogram was done in March. Morbid obesity: That being a struggle for her all her life. I will be scheduled for exercise for now until we have the situation of her coronary cleared.  Medication Adjustments/Labs and Tests Ordered: Current medicines are reviewed at length with the patient today.  Concerns regarding medicines are outlined above.  No orders of the defined types were placed in this  encounter.  Medication changes: No orders of the defined types were placed in this encounter.   Signed, Park Liter, MD, [facility] 06/06/2017 8:49 AM    [place]

## 2017-06-07 ENCOUNTER — Emergency Department (HOSPITAL_COMMUNITY): Payer: 59

## 2017-06-07 ENCOUNTER — Encounter (HOSPITAL_COMMUNITY)
Admission: EM | Disposition: A | Discharge status: Home / Self Care | Payer: Self-pay | Source: Home / Self Care | Attending: Emergency Medicine

## 2017-06-07 ENCOUNTER — Telehealth: Payer: Self-pay

## 2017-06-07 ENCOUNTER — Observation Stay (HOSPITAL_COMMUNITY)
Admission: EM | Admit: 2017-06-07 | Discharge: 2017-06-09 | Disposition: A | Discharge status: Home / Self Care | Payer: 59 | Attending: Internal Medicine | Admitting: Internal Medicine

## 2017-06-07 ENCOUNTER — Encounter (HOSPITAL_COMMUNITY): Payer: Self-pay

## 2017-06-07 DIAGNOSIS — Z952 Presence of prosthetic heart valve: Secondary | ICD-10-CM | POA: Diagnosis not present

## 2017-06-07 DIAGNOSIS — R0789 Other chest pain: Secondary | ICD-10-CM

## 2017-06-07 DIAGNOSIS — R079 Chest pain, unspecified: Secondary | ICD-10-CM | POA: Diagnosis present

## 2017-06-07 DIAGNOSIS — Z7901 Long term (current) use of anticoagulants: Secondary | ICD-10-CM | POA: Insufficient documentation

## 2017-06-07 DIAGNOSIS — I110 Hypertensive heart disease with heart failure: Secondary | ICD-10-CM | POA: Diagnosis not present

## 2017-06-07 DIAGNOSIS — Z950 Presence of cardiac pacemaker: Secondary | ICD-10-CM | POA: Diagnosis not present

## 2017-06-07 DIAGNOSIS — Z8774 Personal history of (corrected) congenital malformations of heart and circulatory system: Secondary | ICD-10-CM | POA: Diagnosis not present

## 2017-06-07 DIAGNOSIS — Z86711 Personal history of pulmonary embolism: Secondary | ICD-10-CM | POA: Diagnosis not present

## 2017-06-07 DIAGNOSIS — I352 Nonrheumatic aortic (valve) stenosis with insufficiency: Secondary | ICD-10-CM | POA: Diagnosis not present

## 2017-06-07 DIAGNOSIS — I472 Ventricular tachycardia: Secondary | ICD-10-CM | POA: Insufficient documentation

## 2017-06-07 DIAGNOSIS — R0989 Other specified symptoms and signs involving the circulatory and respiratory systems: Secondary | ICD-10-CM | POA: Insufficient documentation

## 2017-06-07 DIAGNOSIS — Z6841 Body Mass Index (BMI) 40.0 and over, adult: Secondary | ICD-10-CM | POA: Insufficient documentation

## 2017-06-07 DIAGNOSIS — G4733 Obstructive sleep apnea (adult) (pediatric): Secondary | ICD-10-CM | POA: Diagnosis not present

## 2017-06-07 DIAGNOSIS — Z95810 Presence of automatic (implantable) cardiac defibrillator: Secondary | ICD-10-CM | POA: Diagnosis present

## 2017-06-07 DIAGNOSIS — I5033 Acute on chronic diastolic (congestive) heart failure: Secondary | ICD-10-CM | POA: Insufficient documentation

## 2017-06-07 DIAGNOSIS — R0600 Dyspnea, unspecified: Secondary | ICD-10-CM | POA: Diagnosis not present

## 2017-06-07 DIAGNOSIS — R0609 Other forms of dyspnea: Secondary | ICD-10-CM

## 2017-06-07 DIAGNOSIS — Z954 Presence of other heart-valve replacement: Secondary | ICD-10-CM

## 2017-06-07 DIAGNOSIS — I469 Cardiac arrest, cause unspecified: Secondary | ICD-10-CM | POA: Diagnosis present

## 2017-06-07 HISTORY — DX: Presence of automatic (implantable) cardiac defibrillator: Z95.810

## 2017-06-07 HISTORY — DX: Unspecified osteoarthritis, unspecified site: M19.90

## 2017-06-07 HISTORY — DX: Cardiac murmur, unspecified: R01.1

## 2017-06-07 HISTORY — DX: Obstructive sleep apnea (adult) (pediatric): G47.33

## 2017-06-07 HISTORY — DX: Dependence on other enabling machines and devices: Z99.89

## 2017-06-07 HISTORY — DX: Other pulmonary embolism without acute cor pulmonale: I26.99

## 2017-06-07 HISTORY — DX: Chest pain, unspecified: R07.9

## 2017-06-07 HISTORY — DX: Nonrheumatic aortic (valve) stenosis: I35.0

## 2017-06-07 HISTORY — DX: Pure hypercholesterolemia, unspecified: E78.00

## 2017-06-07 LAB — BASIC METABOLIC PANEL
Anion gap: 10 (ref 5–15)
BUN/Creatinine Ratio: 22 (ref 9–23)
BUN: 16 mg/dL (ref 6–24)
BUN: 16 mg/dL (ref 6–20)
CALCIUM: 9.6 mg/dL (ref 8.7–10.2)
CHLORIDE: 104 mmol/L (ref 101–111)
CO2: 20 mmol/L (ref 20–29)
CO2: 27 mmol/L (ref 22–32)
CREATININE: 0.73 mg/dL (ref 0.57–1.00)
CREATININE: 0.98 mg/dL (ref 0.44–1.00)
Calcium: 9.9 mg/dL (ref 8.9–10.3)
Chloride: 107 mmol/L — ABNORMAL HIGH (ref 96–106)
GFR calc Af Amer: 113 mL/min/{1.73_m2} (ref >59)
GFR calc Af Amer: >60 mL/min (ref >60)
GFR calc non Af Amer: 98 mL/min/{1.73_m2} (ref >59)
GFR calc non Af Amer: >60 mL/min (ref >60)
Glucose, Bld: 129 mg/dL — ABNORMAL HIGH (ref 65–99)
Glucose: 110 mg/dL — ABNORMAL HIGH (ref 65–99)
POTASSIUM: 4.4 mmol/L (ref 3.5–5.2)
Potassium: 3.8 mmol/L (ref 3.5–5.1)
Sodium: 144 mmol/L (ref 134–144)
Sodium: 141 mmol/L (ref 135–145)

## 2017-06-07 LAB — CBC
HCT: 38.2 % (ref 36.0–46.0)
HCT: 35.5 % — ABNORMAL LOW (ref 36.0–46.0)
Hemoglobin: 11.9 g/dL — ABNORMAL LOW (ref 12.0–15.0)
Hemoglobin: 11.5 g/dL — ABNORMAL LOW (ref 12.0–15.0)
MCH: 27.3 pg (ref 26.0–34.0)
MCH: 28 pg (ref 26.0–34.0)
MCHC: 31.2 g/dL (ref 30.0–36.0)
MCHC: 32.4 g/dL (ref 30.0–36.0)
MCV: 87.6 fL (ref 78.0–100.0)
MCV: 86.6 fL (ref 78.0–100.0)
PLATELETS: 182 10*3/uL (ref 150–400)
PLATELETS: 193 10*3/uL (ref 150–400)
RBC: 4.36 MIL/uL (ref 3.87–5.11)
RBC: 4.1 MIL/uL (ref 3.87–5.11)
RDW: 16.1 % — AB (ref 11.5–15.5)
RDW: 16.2 % — AB (ref 11.5–15.5)
WBC: 6.1 10*3/uL (ref 4.0–10.5)
WBC: 7.2 10*3/uL (ref 4.0–10.5)

## 2017-06-07 LAB — CBC WITH DIFFERENTIAL/PLATELET
BASOS ABS: 0 10*3/uL (ref 0.0–0.2)
Basos: 0 %
EOS (ABSOLUTE): 0 10*3/uL (ref 0.0–0.4)
Eos: 0 %
HEMOGLOBIN: 11.1 g/dL (ref 11.1–15.9)
Hematocrit: 34.6 % (ref 34.0–46.6)
IMMATURE GRANS (ABS): 0 10*3/uL (ref 0.0–0.1)
Immature Granulocytes: 0 %
LYMPHS ABS: 1.7 10*3/uL (ref 0.7–3.1)
Lymphs: 27 %
MCH: 27.7 pg (ref 26.6–33.0)
MCHC: 32.1 g/dL (ref 31.5–35.7)
MCV: 86 fL (ref 79–97)
MONOS ABS: 0.3 10*3/uL (ref 0.1–0.9)
Monocytes: 5 %
NEUTROS ABS: 4.2 10*3/uL (ref 1.4–7.0)
Neutrophils: 68 %
Platelets: 179 10*3/uL (ref 150–379)
RBC: 4.01 x10E6/uL (ref 3.77–5.28)
RDW: 16 % — ABNORMAL HIGH (ref 12.3–15.4)
WBC: 6.2 10*3/uL (ref 3.4–10.8)

## 2017-06-07 LAB — TROPONIN I: Troponin I: 0.07 ng/mL (ref 0.00–0.04)

## 2017-06-07 LAB — PROTIME-INR
INR: 1 (ref 0.8–1.2)
Prothrombin Time: 10.3 s (ref 9.1–12.0)

## 2017-06-07 LAB — I-STAT TROPONIN, ED: Troponin i, poc: 0.01 ng/mL (ref 0.00–0.08)

## 2017-06-07 LAB — CREATININE, SERUM
CREATININE: 0.96 mg/dL (ref 0.44–1.00)
GFR calc Af Amer: >60 mL/min (ref >60)
GFR calc non Af Amer: >60 mL/min (ref >60)

## 2017-06-07 LAB — BRAIN NATRIURETIC PEPTIDE: B Natriuretic Peptide: 247.6 pg/mL — ABNORMAL HIGH (ref 0.0–100.0)

## 2017-06-07 SURGERY — INVASIVE LAB ABORTED CASE

## 2017-06-07 MED ORDER — ASPIRIN 300 MG RE SUPP
300.0000 mg | RECTAL | Status: AC
  Filled 2017-06-07: qty 1

## 2017-06-07 MED ORDER — ONDANSETRON HCL 4 MG/2ML IJ SOLN: 4.0000 mg | Freq: Four times a day (QID) | INTRAMUSCULAR | Status: DC | PRN

## 2017-06-07 MED ORDER — SODIUM CHLORIDE 0.9 % IV SOLN
INTRAVENOUS | Status: DC
  Administered 2017-06-08: 06:00:00 via INTRAVENOUS

## 2017-06-07 MED ORDER — ASPIRIN 81 MG PO CHEW
324.0000 mg | CHEWABLE_TABLET | ORAL | Status: AC
  Administered 2017-06-07: 324 mg via ORAL
  Filled 2017-06-07: qty 4

## 2017-06-07 MED ORDER — SODIUM CHLORIDE 0.9% FLUSH
3.0000 mL | Freq: Two times a day (BID) | INTRAVENOUS | Status: DC
  Administered 2017-06-07: 3 mL via INTRAVENOUS

## 2017-06-07 MED ORDER — HEPARIN SODIUM (PORCINE) 5000 UNIT/ML IJ SOLN
5000.0000 [IU] | Freq: Three times a day (TID) | INTRAMUSCULAR | Status: DC
  Administered 2017-06-08: 5000 [IU] via SUBCUTANEOUS
  Filled 2017-06-07: qty 1

## 2017-06-07 MED ORDER — ASPIRIN EC 81 MG PO TBEC
81.0000 mg | DELAYED_RELEASE_TABLET | Freq: Every day | ORAL | Status: DC
  Administered 2017-06-08: 81 mg via ORAL
  Filled 2017-06-07: qty 1

## 2017-06-07 MED ORDER — ACETAMINOPHEN 325 MG PO TABS: 650.0000 mg | ORAL_TABLET | ORAL | Status: DC | PRN

## 2017-06-07 MED ORDER — SODIUM CHLORIDE 0.9 % IV SOLN: 250.0000 mL | INTRAVENOUS | Status: DC | PRN

## 2017-06-07 MED ORDER — FENOFIBRATE 54 MG PO TABS
54.0000 mg | ORAL_TABLET | Freq: Every day | ORAL | Status: DC
  Administered 2017-06-09: 54 mg via ORAL
  Filled 2017-06-07 (×2): qty 1

## 2017-06-07 MED ORDER — NITROGLYCERIN 0.4 MG SL SUBL: 0.4000 mg | SUBLINGUAL_TABLET | SUBLINGUAL | Status: DC | PRN

## 2017-06-07 MED ORDER — SODIUM CHLORIDE 0.9% FLUSH: 3.0000 mL | INTRAVENOUS | Status: DC | PRN

## 2017-06-07 MED ORDER — ATORVASTATIN CALCIUM 80 MG PO TABS: 80.0000 mg | ORAL_TABLET | Freq: Every day | ORAL | Status: DC

## 2017-06-07 SURGICAL SUPPLY — 4 items
KIT HEART LEFT (KITS) ×1 IMPLANT
PACK CARDIAC CATHETERIZATION (CUSTOM PROCEDURE TRAY) ×1 IMPLANT
TRANSDUCER W/STOPCOCK (MISCELLANEOUS) ×1 IMPLANT
TUBING CIL FLEX 10 FLL-RA (TUBING) ×1 IMPLANT

## 2017-06-07 NOTE — ED Triage Notes (Signed)
Per Pt, Pt is coming from home with complaints of Elevated Troponin per PCP. Pt went to PCP for increasing SOB with exertion, but denies CP and SOB at this time. Hx of Pacemaker and valve replacement.

## 2017-06-07 NOTE — ED Provider Notes (Signed)
[facility] DEPT Provider Note   CSN: [id number] Arrival date & time: 06/07/17  1117     History   Chief Complaint Chief Complaint  Patient presents with  . Abnormal Lab    HPI Heather Mosley is a 51 y.o. female with history of aortic stenosis with 2 valve replacements, as well as ICD in place who presents with a several month history of shortness of breath on exertion. Patient has had associated left-sided chest pressure with these symptoms. Patient denies any shortness of breath at rest. Patient saw her cardiologist, Dr. Agustin Cree, yesterday and blood was drawn. She was called and told she had an elevated troponin level and to go to the emergency department. Patient had a heart cath scheduled for next week. Patient denies any chest pain or shortness of breath at this time. When patient has shortness of breath, she does have some associated diaphoresis. Patient denies any nausea, vomiting, urinary symptoms. Patient denies any recent long trips, no leg pain or swelling, surgeries, cancer, exogenous hormone use.  HPI  Past Medical History:  Diagnosis Date  . Congenital heart defect   . Hypertension     Patient Active Problem List   Diagnosis Date Noted  . History of pulmonary embolism 06/06/2017  . History of cardiac arrest 06/06/2017  . ICD (implantable cardioverter-defibrillator) in place 05/24/2016  . Morbid obesity ([place]) 06/02/2015  . S/P ICD (internal cardiac defibrillator) procedure 11/09/2014  . History of biliary T-tube placement 11/09/2014  . Acute on chronic systolic ACC/AHA stage C congestive heart failure ([place]) 11/09/2014  . Cardiac arrest ([place]) 11/03/2014  . Aortic valve stenosis 10/28/2014  . Severe aortic stenosis 10/28/2014  . Status post combined aortic root and valve replacement using stentless bioprosthetic aortic valve 09/18/2014  . Nonrheumatic aortic valve insufficiency 09/18/2014  . H/O aortic valve replacement 09/18/2014    Past Surgical History:    Procedure Laterality Date  . AORTIC VALVE REPAIR    . AORTIC VALVE SURGERY     TAVER Valve, And aortic  . CESAREAN SECTION    . INSERT / REPLACE / REMOVE PACEMAKER     ICD    OB History    No data available       Home Medications    Prior to Admission medications   Medication Sig Start Date End Date Taking? Authorizing Provider  acetaminophen (TYLENOL) 500 MG tablet Take 1,000 mg by mouth 2 (two) times daily as needed for moderate pain or headache.   Yes [provider]  aspirin EC 81 MG tablet Take 81 mg by mouth daily.   Yes [provider]  atorvastatin (LIPITOR) 80 MG tablet Take 80 mg by mouth daily.  03/13/17  Yes [provider]  celecoxib (CELEBREX) 200 MG capsule Take 200 mg by mouth daily.   Yes [provider]  fenofibrate (TRICOR) 48 MG tablet Take 48 mg by mouth daily.  02/05/17  Yes [provider]  furosemide (LASIX) 40 MG tablet Take 20-40 mg by mouth See admin instructions. Take 40 mg in the morning and 20 mg in the evening 06/12/16  Yes [provider]  Misc Natural Products (OSTEO BI-FLEX JOINT SHIELD) TABS Take 1 tablet by mouth at bedtime.   Yes [provider]  mometasone (NASONEX) 50 MCG/ACT nasal spray Place 2 sprays into the nose daily as needed (allergies).    Yes [provider]  Multiple Vitamin (MULTIVITAMIN WITH MINERALS) TABS tablet Take 1 tablet by mouth at bedtime.  Yes [provider]  Omega-3 Fatty Acids (FISH OIL ULTRA) 1400 MG CAPS Take 1,400 mg by mouth 2 (two) times daily.   Yes [provider]  potassium chloride SA (K-DUR,KLOR-CON) 20 MEQ tablet Take 10 mEq by mouth daily.  11/03/16  Yes [provider]  ranolazine (RANEXA) 500 MG 12 hr tablet Take 1 tablet (500 mg total) by mouth 2 (two) times daily. 06/06/17  Yes Park Liter, MD    Family History Family History  Problem Relation Age of Onset  . Hypertension Mother   . Leukemia Father    . Hyperlipidemia Father     Social History Social History  Substance Use Topics  . Smoking status: Never Smoker  . Smokeless tobacco: Never Used  . Alcohol use No     Allergies   Tape   Review of Systems Review of Systems  Constitutional: Negative for chills and fever.  HENT: Negative for facial swelling and sore throat.   Respiratory: Positive for shortness of breath.   Cardiovascular: Positive for chest pain.  Gastrointestinal: Negative for abdominal pain, nausea and vomiting.  Genitourinary: Negative for dysuria.  Musculoskeletal: Negative for back pain.  Skin: Negative for rash and wound.  Neurological: Negative for headaches.  Psychiatric/Behavioral: The patient is not nervous/anxious.      Physical Exam Updated Vital Signs BP (!) 107/59   Pulse 81   Temp 98.5 F (36.9 C) (Oral)   Resp 16   Ht 5\' 1"  (1.549 m)   Wt (!) 144.2 kg (318 lb)   LMP 12/27/2016 (Within Weeks)   SpO2 94%   BMI 60.09 kg/m   Physical Exam  Constitutional: She appears well-developed and well-nourished. No distress.  HENT:  Head: Normocephalic and atraumatic.  Mouth/Throat: Oropharynx is clear and moist. No oropharyngeal exudate.  Eyes: Pupils are equal, round, and reactive to light. Conjunctivae are normal. Right eye exhibits no discharge. Left eye exhibits no discharge. No scleral icterus.  Neck: Normal range of motion. Neck supple. No thyromegaly present.  Cardiovascular: Normal rate, regular rhythm, normal heart sounds and intact distal pulses.  Exam reveals no gallop and no friction rub.   No murmur heard. Pulmonary/Chest: Effort normal and breath sounds normal. No stridor. No respiratory distress. She has no wheezes. She has no rales. She exhibits no tenderness.  Abdominal: Soft. Bowel sounds are normal. She exhibits no distension. There is no tenderness. There is no rebound and no guarding.  Musculoskeletal: She exhibits no edema.  Lymphadenopathy:    She has no cervical  adenopathy.  Neurological: She is alert. Coordination normal.  Skin: Skin is warm and dry. No rash noted. She is not diaphoretic. No pallor.  Psychiatric: She has a normal mood and affect.  Nursing note and vitals reviewed.    ED Treatments / Results  Labs (all labs ordered are listed, but only abnormal results are displayed) Labs Reviewed  BASIC METABOLIC PANEL - Abnormal; Notable for the following:       Result Value   Glucose, Bld 129 (*)    All other components within normal limits  CBC - Abnormal; Notable for the following:    Hemoglobin 11.9 (*)    RDW 16.1 (*)    All other components within normal limits  I-STAT TROPONIN, ED    EKG  EKG Interpretation  Date/Time:  Thursday June 07 2017 11:28:08 EDT Ventricular Rate:  89 PR Interval:  168 QRS Duration: 80 QT Interval:  362 QTC Calculation: 440 R Axis:  88 Text Interpretation:  Normal sinus rhythm Septal infarct , age undetermined Abnormal ECG Since previous tracing tachycardia resolved, QRS decreased Confirmed by Alfonzo Beers [phone]) on 06/07/2017 11:52:23 AM       Radiology Dg Chest 2 View  Result Date: 06/07/2017 CLINICAL DATA:  Chest pressure. EXAM: CHEST  2 VIEW COMPARISON:  11/03/2014 FINDINGS: Dual lead cardiac pacemaker, median sternotomy changes and vascular stent are unchanged radiographically. The cardiac silhouette is mildly enlarged. Mediastinal contours appear intact. Tortuosity of the aorta. There is no evidence of focal airspace consolidation, pleural effusion or pneumothorax. Mild pulmonary vascular congestion. Osseous structures are without acute abnormality. Soft tissues are grossly normal. IMPRESSION: Enlarged heart. Tortuosity of the thoracic aorta. Mild pulmonary vascular congestion. Electronically Signed   By: Fidela Salisbury M.D.   On: 06/07/2017 13:32    Procedures Procedures (including critical care time)  Medications Ordered in ED Medications - No data to display   Initial  Impression / Assessment and Plan / ED Course  I have reviewed the triage vital signs and the nursing notes.  Pertinent labs & imaging results that were available during my care of the patient were reviewed by me and considered in my medical decision making (see chart for details).     Patient with elevated troponin yesterday drawn by cardiologists office. Troponin back to 0.01 today. CBC shows hemoglobin 11.9. BMP unremarkable. CXR shows cardiomegaly, tortuosity of the thoracic aorta; mild pulmonary vascular congestion. EKG shows NSR. I consulted cardiology and patient was evaluated by nurse practitioner, Reino Bellis, and cardiology will proceed with cardiac catheterization today. Transfer of care to cardiology for further evaluation and treatment.  Final Clinical Impressions(s) / ED Diagnoses   Final diagnoses:  Dyspnea on exertion  Chest pain, unspecified type    New Prescriptions New Prescriptions   No medications on file     Frederica Kuster, Hershal Coria 06/07/17 1559    Marcha Dutton Forbes Cellar, MD 06/07/17 [phone]

## 2017-06-07 NOTE — H&P (Signed)
H and P    Patient ID: Heather Mosley MRN: [id number], DOB/AGE: January 13, 1966   Admit date: 06/07/2017 Date of Consult: 06/07/2017  Primary Physician: Mateo Flow, MD Primary Cardiologist: Agustin Cree Requesting Provider: Dr. Marcha Dutton Reason for Consultation: Dyspnea, positive troponin  Heather Mosley is a 51 y.o. female who is being seen today for the evaluation of dyspnea, and positive troponin at the request of Dr. Marcha Dutton.   Patient Profile    51 year old female with past medical history of aortic valve/aortic root replacement with porcine valve, and extending aorta and hemi-arch replacement and 2006, hypertension and obesity who presented with worsening dyspnea chest pressure and elevated troponin.  Past Medical History   Past Medical History:  Diagnosis Date  . Congenital heart defect   . Hypertension     Past Surgical History:  Procedure Laterality Date  . AORTIC VALVE REPAIR    . AORTIC VALVE SURGERY     TAVER Valve, And aortic  . CESAREAN SECTION    . INSERT / REPLACE / REMOVE PACEMAKER     ICD     Allergies  Allergies  Allergen Reactions  . Tape Rash    History of Present Illness    Heather Mosley is a 51 year old female with past medical history of aortic valve/aortic root replacement with porcine valve, and extending aorta and hemi-arch replacement and 2006, hypertension and obesity. She is followed by Dr. Agustin Cree. Reports she was one with congenital heart defect, requiring aortic root replacement with porcine valve and extending aorta and hemi-arch replacement in 2006. Subsequently developed severe symptomatic prosthetic aortic valve regurgitation and underwent successful TAVR with 26 mm core valve in December 2015 at [facility]. Shortly after undergoing second valve replacement developed PE, and had cardiac arrest secondary to VT. Had ICD placement at [facility] after this event, and placed on Xarelto. States she was on this for one year and then stopped. Has not had any  further complications since. Reports having cardiac cath in 2006 and 2015 prior to valve replacement with clean coronaries.  Reports over the past couple weeks she has had intermittent exertional chest tightness and dyspnea. Presented to the office on 06/06/17 with reported symptoms. Denies any orthopnea, LE edema, dizziness, or n/v. Discussed the option of stress testing versus further ischemic workup. Given her body habitus she was scheduled for cardiac catheterization next Tuesday. Troponin checked in the office yesterday reported at 0.07. She was called this morning and instructed to come to the [facility] for further evaluation, and to possibly have her cardiac cath mood forward. In the ED her labs showed stable electrolytes, creatinine 0.98, POC troponin 0.01, hemoglobin 11.9. Chest x-ray showed mild pulmonary vascular congestion. EKG shows sinus rhythm with nonspecific T-wave changes. She is currently asymptomatic while at rest, but reports symptoms quickly developed with minimal exertion.  Inpatient Medications      Family History    Family History  Problem Relation Age of Onset  . Hypertension Mother   . Leukemia Father   . Hyperlipidemia Father     Social History    Social History   Social History  . Marital status: Married    Spouse name: N/A  . Number of children: N/A  . Years of education: N/A   Occupational History  . Not on file.   Social History Main Topics  . Smoking status: Never Smoker  . Smokeless tobacco: Never Used  . Alcohol use No  . Drug use: No  . Sexual activity: Not  on file   Other Topics Concern  . Not on file   Social History Narrative  . No narrative on file     Review of Systems    See history of present illness All other systems reviewed and are otherwise negative except as noted above.  Physical Exam    Blood pressure (!) 116/57, pulse 81, temperature 98.5 F (36.9 C), temperature source Oral, resp. rate 15, height 5\' 1"  (1.549 m),  weight (!) 318 lb (144.2 kg), last menstrual period 12/27/2016, SpO2 95 %.  General: Pleasant, Obese white female, NAD Psych: Normal affect. Neuro: Alert and oriented X 3. Moves all extremities spontaneously. HEENT: Normal  Neck: Supple without bruits or JVD. Lungs:  Resp regular and unlabored, CTA. Heart: RRR no s3, s4, 2/6 soft systolic murmur. Abdomen: Soft, non-tender, non-distended, BS + x 4.  Extremities: No clubbing, cyanosis or edema. DP/PT/Radials 2+ and equal bilaterally.  Labs    Troponin [facility] Test)  Recent Labs  06/07/17 1136  TROPIPOC 0.01    Recent Labs  06/06/17 0926  TROPONINI 0.07*   Lab Results  Component Value Date   WBC 6.1 06/07/2017   HGB 11.9 (L) 06/07/2017   HCT 38.2 06/07/2017   MCV 87.6 06/07/2017   PLT 182 06/07/2017    Recent Labs Lab 06/07/17 1128  NA 141  K 3.8  CL 104  CO2 27  BUN 16  CREATININE 0.98  CALCIUM 9.9  GLUCOSE 129*   No results found for: CHOL, HDL, LDLCALC, TRIG No results found for: [facility]   Radiology Studies    Dg Chest 2 View  Result Date: 06/07/2017 CLINICAL DATA:  Chest pressure. EXAM: CHEST  2 VIEW COMPARISON:  11/03/2014 FINDINGS: Dual lead cardiac pacemaker, median sternotomy changes and vascular stent are unchanged radiographically. The cardiac silhouette is mildly enlarged. Mediastinal contours appear intact. Tortuosity of the aorta. There is no evidence of focal airspace consolidation, pleural effusion or pneumothorax. Mild pulmonary vascular congestion. Osseous structures are without acute abnormality. Soft tissues are grossly normal. IMPRESSION: Enlarged heart. Tortuosity of the thoracic aorta. Mild pulmonary vascular congestion. Electronically Signed   By: Fidela Salisbury M.D.   On: 06/07/2017 13:32    ECG & Cardiac Imaging    EKG: Sinus rhythm with nonspecific T-wave inversions  Assessment & Plan    51 year old female with past medical history of aortic valve/aortic root replacement  with porcine valve, and extending aorta and hemi-arch replacement and 2006, hypertension and obesity who presented with worsening dyspnea chest pressure and elevated troponin.  1. Chest tightness/dyspnea: reports worsening dyspnea on exertion over the past couple of weeks with intermittent chest tightness.  This improves with rest. Last cath was 2015 which showed normal coronaries. Seen in the office yesterday and set up for outpatient cath next Tuesday. Noted to have troponin of 0.07 and called this morning instructed to come into the ED. She is currently asymptomatic while at rest, but reports symptoms occur easily with minimal activity. Trop now negative with first draw in the ED. Given symptoms and planned cath will proceed with cath today.  -- The patient understands that risks included but are not limited to stroke (1 in 1000), death (1 in 54), kidney failure [usually temporary] (1 in 500), bleeding (1 in 200), allergic reaction [possibly serious] (1 in 200). Of note reports having difficulty with radial approach in the past.  -- check BNP  2. Congenital Heart defect s/p TAVR:  Reports TAVR in 2015 and developed  PE. Was on Xarelto for a year afterwards then stopped. Never had symptoms with initial valve in 2006, but symptomatic in 2015 when leaflet collapsed with chest pain and heart failure symptoms.  -- reports having recent echo, but unable to locate in care everywhere  3. Hx of VT s/p ICD: Last interrogation in chart 5/18 showed 7 VT episodes noted therapy and 10 NSVT episodes.  4. OSA on CPAP: Reports being compliant at home.   5. History of PE: Develop post TAVR, on Xarelto for one year and then stopped by cardiology.  6. HTN: stable  7. HL: on fenofibrate, and statin -- Lipid panel 4/18 showed LDL 74, triglycerides 132  Signed, Reino Bellis, NP-C Pager [phone] 06/07/2017, 2:31 PM   Patient seen and examined  I agree with findingas as noted by Eugene Garnet above   Pt seen  by Dr Agustin Cree yesterday  Set up for cath next week  Labs drawn Trop sl elevated at 0.07  With symptoms and labs told to come to ED today    Pt curr comfortable  Lungs are CTA  Cardiac RRR  No S3  Ext without edema  Pt ok for cath  This afternoon.  Risks /benefits described  Agrees to proceed.    Dorris Carnes

## 2017-06-07 NOTE — ED Notes (Signed)
Cath lab called and stated that they are ready to for the patient in cath lab 3.  Patient taken on telemetry to the cath lab by [employer].  All belongings taken with the patient.

## 2017-06-07 NOTE — Telephone Encounter (Signed)
Lab called with a critical lab for the patient's Troponin of 0.07. Dr Agustin Cree was made aware of the results. I reviewed the results with him, and he had me contact the patient about her Troponin level being elevated, and have her go to the [facility] ER. I let her know the results and about the concern of the elevation, as Dr. Agustin Cree wanted her to go ahead and go to the ER, and see if they may go ahead with her Cath procedure today, instead of waiting until Tuesday. I also advised that she may have it rechecked when she arrives. She expressed understanding and I answered her questions as well as going over the concern of waiting until Tuesday as well as what the lab checked for. She was also made aware that she should tell them we have sent her do to this lab being elevated and that the ER will be able to see it in her chart.

## 2017-06-07 NOTE — ED Notes (Signed)
Pt ambulatory to restroom

## 2017-06-07 NOTE — ED Notes (Signed)
Per Cards, pt can eat heart healthy diet, NPO at midnight.

## 2017-06-07 NOTE — ED Notes (Signed)
Pt eating

## 2017-06-07 NOTE — ED Notes (Signed)
Ordered heart healthy tray  

## 2017-06-07 NOTE — Interval H&P Note (Signed)
History and Physical Interval Note:  06/07/2017 4:59 PM    Heather Mosley  has presented today for surgery, with the diagnosis of cp  The various methods of treatment have been discussed with the patient and family. After consideration of risks, benefits and other options for treatment, the patient has consented to  Procedure(s): INVASIVE LAB ABORTED/CANCEL CASE as a surgical intervention .  The patient's history has been reviewed, patient examined, no change in status, stable for surgery.  I have reviewed the patient's chart and labs.  Questions were answered to the patient's satisfaction.    The patient was brought to the cath lab. After reviewing her history which includes aortic root replacement, coronary reimplantation, and Core Valve implantation in 2015, I decided to postpone the procedure until a.m. to allow time to review her records and possibly prior angiogram from [facility] The procedure will be higher risk. Previously documented to have normal coronaries.  Belva Crome III

## 2017-06-08 ENCOUNTER — Encounter (HOSPITAL_COMMUNITY): Payer: Self-pay | Admitting: Interventional Cardiology

## 2017-06-08 ENCOUNTER — Encounter (HOSPITAL_COMMUNITY)
Admission: EM | Disposition: A | Discharge status: Home / Self Care | Payer: Self-pay | Source: Home / Self Care | Attending: Emergency Medicine

## 2017-06-08 ENCOUNTER — Observation Stay (HOSPITAL_BASED_OUTPATIENT_CLINIC_OR_DEPARTMENT_OTHER): Payer: 59

## 2017-06-08 DIAGNOSIS — R0609 Other forms of dyspnea: Secondary | ICD-10-CM

## 2017-06-08 DIAGNOSIS — I2510 Atherosclerotic heart disease of native coronary artery without angina pectoris: Secondary | ICD-10-CM

## 2017-06-08 DIAGNOSIS — I340 Nonrheumatic mitral (valve) insufficiency: Secondary | ICD-10-CM

## 2017-06-08 DIAGNOSIS — I110 Hypertensive heart disease with heart failure: Secondary | ICD-10-CM | POA: Diagnosis not present

## 2017-06-08 DIAGNOSIS — I352 Nonrheumatic aortic (valve) stenosis with insufficiency: Secondary | ICD-10-CM | POA: Diagnosis not present

## 2017-06-08 DIAGNOSIS — I5033 Acute on chronic diastolic (congestive) heart failure: Secondary | ICD-10-CM | POA: Diagnosis not present

## 2017-06-08 HISTORY — PX: LEFT HEART CATH AND CORONARY ANGIOGRAPHY: CATH118249

## 2017-06-08 LAB — BASIC METABOLIC PANEL
ANION GAP: 8 (ref 5–15)
BUN: 20 mg/dL (ref 6–20)
CALCIUM: 9.4 mg/dL (ref 8.9–10.3)
CO2: 27 mmol/L (ref 22–32)
Chloride: 105 mmol/L (ref 101–111)
Creatinine, Ser: 0.99 mg/dL (ref 0.44–1.00)
Glucose, Bld: 116 mg/dL — ABNORMAL HIGH (ref 65–99)
POTASSIUM: 3.8 mmol/L (ref 3.5–5.1)
Sodium: 140 mmol/L (ref 135–145)

## 2017-06-08 LAB — CBC
HCT: 34.6 % — ABNORMAL LOW (ref 36.0–46.0)
HEMOGLOBIN: 11 g/dL — AB (ref 12.0–15.0)
MCH: 27.7 pg (ref 26.0–34.0)
MCHC: 31.8 g/dL (ref 30.0–36.0)
MCV: 87.2 fL (ref 78.0–100.0)
Platelets: 179 10*3/uL (ref 150–400)
RBC: 3.97 MIL/uL (ref 3.87–5.11)
RDW: 16.3 % — ABNORMAL HIGH (ref 11.5–15.5)
WBC: 5.7 10*3/uL (ref 4.0–10.5)

## 2017-06-08 LAB — CREATININE, SERUM: CREATININE: 0.83 mg/dL (ref 0.44–1.00)

## 2017-06-08 LAB — ECHOCARDIOGRAM COMPLETE
Height: 61 in
Weight: 4947.2 oz

## 2017-06-08 LAB — PREGNANCY, URINE: Preg Test, Ur: NEGATIVE

## 2017-06-08 LAB — HIV ANTIBODY (ROUTINE TESTING W REFLEX): HIV SCREEN 4TH GENERATION: NONREACTIVE

## 2017-06-08 SURGERY — LEFT HEART CATH AND CORONARY ANGIOGRAPHY
Anesthesia: LOCAL

## 2017-06-08 MED ORDER — IOPAMIDOL (ISOVUE-370) INJECTION 76%
INTRAVENOUS | Status: AC
  Filled 2017-06-08: qty 100

## 2017-06-08 MED ORDER — HEPARIN SODIUM (PORCINE) 1000 UNIT/ML IJ SOLN
INTRAMUSCULAR | Status: DC | PRN
  Administered 2017-06-08: 6000 [IU] via INTRAVENOUS

## 2017-06-08 MED ORDER — OMEGA-3-ACID ETHYL ESTERS 1 G PO CAPS
1000.0000 mg | ORAL_CAPSULE | Freq: Two times a day (BID) | ORAL | Status: DC
Start: 2017-06-08 — End: 2017-06-09
  Administered 2017-06-08 – 2017-06-09 (×2): 1000 mg via ORAL
  Filled 2017-06-08 (×2): qty 1

## 2017-06-08 MED ORDER — ACETAMINOPHEN 325 MG PO TABS: 650.0000 mg | ORAL_TABLET | ORAL | Status: DC | PRN

## 2017-06-08 MED ORDER — ASPIRIN EC 81 MG PO TBEC
81.0000 mg | DELAYED_RELEASE_TABLET | Freq: Every day | ORAL | Status: DC
Start: 2017-06-09 — End: 2017-06-09
  Administered 2017-06-09: 81 mg via ORAL
  Filled 2017-06-08: qty 1

## 2017-06-08 MED ORDER — LIDOCAINE HCL (PF) 1 % IJ SOLN
INTRAMUSCULAR | Status: DC | PRN
  Administered 2017-06-08: 2 mL

## 2017-06-08 MED ORDER — HEPARIN SODIUM (PORCINE) 5000 UNIT/ML IJ SOLN
5000.0000 [IU] | Freq: Three times a day (TID) | INTRAMUSCULAR | Status: DC
  Administered 2017-06-08 – 2017-06-09 (×3): 5000 [IU] via SUBCUTANEOUS
  Filled 2017-06-08 (×3): qty 1

## 2017-06-08 MED ORDER — VERAPAMIL HCL 2.5 MG/ML IV SOLN
INTRAVENOUS | Status: AC
  Filled 2017-06-08: qty 2

## 2017-06-08 MED ORDER — SODIUM CHLORIDE 0.9 % IV SOLN: 250.0000 mL | INTRAVENOUS | Status: DC | PRN

## 2017-06-08 MED ORDER — IOPAMIDOL (ISOVUE-370) INJECTION 76%
INTRAVENOUS | Status: DC | PRN
  Administered 2017-06-08: 135 mL via INTRA_ARTERIAL

## 2017-06-08 MED ORDER — ACETAMINOPHEN 500 MG PO TABS: 1000.0000 mg | ORAL_TABLET | Freq: Two times a day (BID) | ORAL | Status: DC | PRN

## 2017-06-08 MED ORDER — LIDOCAINE HCL (PF) 1 % IJ SOLN
INTRAMUSCULAR | Status: AC
  Filled 2017-06-08: qty 30

## 2017-06-08 MED ORDER — SODIUM CHLORIDE 0.9% FLUSH: 3.0000 mL | INTRAVENOUS | Status: DC | PRN

## 2017-06-08 MED ORDER — OXYCODONE-ACETAMINOPHEN 5-325 MG PO TABS: 1.0000 | ORAL_TABLET | ORAL | Status: DC | PRN

## 2017-06-08 MED ORDER — OSTEO BI-FLEX JOINT SHIELD PO TABS: 1.0000 | ORAL_TABLET | Freq: Every day | ORAL | Status: DC

## 2017-06-08 MED ORDER — FUROSEMIDE 10 MG/ML IJ SOLN
60.0000 mg | Freq: Once | INTRAMUSCULAR | Status: AC
  Administered 2017-06-08: 60 mg via INTRAVENOUS
  Filled 2017-06-08: qty 6

## 2017-06-08 MED ORDER — ASPIRIN 81 MG PO CHEW: 81.0000 mg | CHEWABLE_TABLET | Freq: Every day | ORAL | Status: DC

## 2017-06-08 MED ORDER — VERAPAMIL HCL 2.5 MG/ML IV SOLN
INTRAVENOUS | Status: DC | PRN
  Administered 2017-06-08: 10 mL via INTRA_ARTERIAL

## 2017-06-08 MED ORDER — ATORVASTATIN CALCIUM 80 MG PO TABS: 80.0000 mg | ORAL_TABLET | Freq: Every day | ORAL | Status: DC

## 2017-06-08 MED ORDER — HEPARIN (PORCINE) IN NACL 2-0.9 UNIT/ML-% IJ SOLN
INTRAMUSCULAR | Status: AC
  Filled 2017-06-08: qty 1000

## 2017-06-08 MED ORDER — FENTANYL CITRATE (PF) 100 MCG/2ML IJ SOLN
INTRAMUSCULAR | Status: AC
  Filled 2017-06-08: qty 2

## 2017-06-08 MED ORDER — PERFLUTREN LIPID MICROSPHERE
1.0000 mL | INTRAVENOUS | Status: AC | PRN
  Administered 2017-06-08: 2 mL via INTRAVENOUS
  Filled 2017-06-08: qty 10

## 2017-06-08 MED ORDER — HEPARIN (PORCINE) IN NACL 2-0.9 UNIT/ML-% IJ SOLN
INTRAMUSCULAR | Status: AC | PRN
  Administered 2017-06-08: 1000 mL

## 2017-06-08 MED ORDER — HEPARIN SODIUM (PORCINE) 1000 UNIT/ML IJ SOLN
INTRAMUSCULAR | Status: AC
  Filled 2017-06-08: qty 1

## 2017-06-08 MED ORDER — MIDAZOLAM HCL 2 MG/2ML IJ SOLN
INTRAMUSCULAR | Status: AC
  Filled 2017-06-08: qty 2

## 2017-06-08 MED ORDER — POTASSIUM CHLORIDE ER 10 MEQ PO TBCR
20.0000 meq | EXTENDED_RELEASE_TABLET | Freq: Once | ORAL | Status: AC
  Administered 2017-06-08: 20 meq via ORAL
  Filled 2017-06-08 (×2): qty 2

## 2017-06-08 MED ORDER — SODIUM CHLORIDE 0.9% FLUSH
3.0000 mL | Freq: Two times a day (BID) | INTRAVENOUS | Status: DC
  Administered 2017-06-08: 3 mL via INTRAVENOUS

## 2017-06-08 MED ORDER — FENTANYL CITRATE (PF) 100 MCG/2ML IJ SOLN
INTRAMUSCULAR | Status: DC | PRN
  Administered 2017-06-08 (×2): 50 ug via INTRAVENOUS

## 2017-06-08 MED ORDER — MIDAZOLAM HCL 2 MG/2ML IJ SOLN
INTRAMUSCULAR | Status: DC | PRN
  Administered 2017-06-08 (×3): 1 mg via INTRAVENOUS

## 2017-06-08 MED ORDER — ONDANSETRON HCL 4 MG/2ML IJ SOLN: 4.0000 mg | Freq: Four times a day (QID) | INTRAMUSCULAR | Status: DC | PRN

## 2017-06-08 MED ORDER — ADULT MULTIVITAMIN W/MINERALS CH
1.0000 | ORAL_TABLET | Freq: Every day | ORAL | Status: DC
  Administered 2017-06-08: 1 via ORAL
  Filled 2017-06-08: qty 1

## 2017-06-08 MED ORDER — FLUTICASONE PROPIONATE 50 MCG/ACT NA SUSP
1.0000 | Freq: Every day | NASAL | Status: DC
  Filled 2017-06-08: qty 16

## 2017-06-08 SURGICAL SUPPLY — 13 items
CATH EXPO 5FR FR4 (CATHETERS) ×1 IMPLANT
CATH INFINITI 5FR MPB2 (CATHETERS) ×1 IMPLANT
COVER PRB 48X5XTLSCP FOLD TPE (BAG) IMPLANT
COVER PROBE 5X48 (BAG) ×2
DEVICE RAD COMP TR BAND LRG (VASCULAR PRODUCTS) ×1 IMPLANT
GLIDESHEATH SLEND A-KIT 6F 22G (SHEATH) ×1 IMPLANT
GUIDEWIRE INQWIRE 1.5J.035X260 (WIRE) IMPLANT
INQWIRE 1.5J .035X260CM (WIRE) ×2
KIT HEART LEFT (KITS) ×2 IMPLANT
PACK CARDIAC CATHETERIZATION (CUSTOM PROCEDURE TRAY) ×2 IMPLANT
TRANSDUCER W/STOPCOCK (MISCELLANEOUS) ×2 IMPLANT
TUBING CIL FLEX 10 FLL-RA (TUBING) ×2 IMPLANT
WIRE HI TORQ VERSACORE-J 145CM (WIRE) ×1 IMPLANT

## 2017-06-08 NOTE — Interval H&P Note (Signed)
Cath Lab Visit (complete for each Cath Lab visit)  Clinical Evaluation Leading to the Procedure:   ACS: No.  Non-ACS:    Anginal Classification: CCS III  Anti-ischemic medical therapy: No Therapy  Non-Invasive Test Results: No non-invasive testing performed  Prior CABG: No previous CABG      History and Physical Interval Note:  06/08/2017 8:42 AM  Heather Mosley  has presented today for surgery, with the diagnosis of chest pain  The various methods of treatment have been discussed with the patient and family. After consideration of risks, benefits and other options for treatment, the patient has consented to  Procedure(s): Left Heart Cath and Coronary Angiography (N/A) as a surgical intervention .  The patient's history has been reviewed, patient examined, no change in status, stable for surgery.  I have reviewed the patient's chart and labs.  Questions were answered to the patient's satisfaction.     Belva Crome III

## 2017-06-08 NOTE — Progress Notes (Signed)
Dr. Harrington Challenger on [place], notified of pt concern regarding lack of  Home medications received today.

## 2017-06-08 NOTE — Progress Notes (Signed)
REviewed echo  With H SMith  And R Krasowski  LVEF normal  Mild turbulence in LVOT but not outflow obstruction generated.  Mean gradient through AV low  Evid of increased filling pressures with diastolic dysfunction  REcomm  WIll diurese with lasix   LVEDP at cath was 38    WIll get limited echo in am with valsalva  See if gradient can be brought out    DIscussed above with pt   WIll need close outpt follow up  ? TEE in future   Low Na 1500 cc diet.    Dorris Carnes

## 2017-06-08 NOTE — Progress Notes (Signed)
  Echocardiogram 2D Echocardiogram has been performed.  Bobbye Charleston 06/08/2017, 1:08 PM

## 2017-06-09 ENCOUNTER — Observation Stay (HOSPITAL_COMMUNITY): Payer: 59

## 2017-06-09 ENCOUNTER — Other Ambulatory Visit (HOSPITAL_COMMUNITY): Payer: 59

## 2017-06-09 DIAGNOSIS — R079 Chest pain, unspecified: Secondary | ICD-10-CM | POA: Diagnosis not present

## 2017-06-09 DIAGNOSIS — I352 Nonrheumatic aortic (valve) stenosis with insufficiency: Secondary | ICD-10-CM | POA: Diagnosis not present

## 2017-06-09 DIAGNOSIS — I110 Hypertensive heart disease with heart failure: Secondary | ICD-10-CM | POA: Diagnosis not present

## 2017-06-09 DIAGNOSIS — Z952 Presence of prosthetic heart valve: Secondary | ICD-10-CM | POA: Diagnosis not present

## 2017-06-09 DIAGNOSIS — I5033 Acute on chronic diastolic (congestive) heart failure: Secondary | ICD-10-CM | POA: Diagnosis not present

## 2017-06-09 DIAGNOSIS — R0789 Other chest pain: Secondary | ICD-10-CM | POA: Diagnosis not present

## 2017-06-09 LAB — ECHOCARDIOGRAM LIMITED
AO mean calculated velocity dopler: 294 cm/s
AOVTI: 103 cm
AV Mean grad: 44 mmHg
AV VEL mean LVOT/AV: 0.26
AV pk vel: 460 cm/s
AVPG: 85 mmHg
Ao pk vel: 0.26 m/s
HEIGHTINCHES: 61 in
LVOT VTI: 27.8 cm
LVOT peak VTI: 0.27 cm
LVOT peak grad rest: 6 mmHg
LVOT peak vel: 119 cm/s
Weight: 4888 oz

## 2017-06-09 LAB — BASIC METABOLIC PANEL
Anion gap: 10 (ref 5–15)
BUN: 17 mg/dL (ref 6–20)
CHLORIDE: 105 mmol/L (ref 101–111)
CO2: 25 mmol/L (ref 22–32)
CREATININE: 0.93 mg/dL (ref 0.44–1.00)
Calcium: 9.5 mg/dL (ref 8.9–10.3)
Glucose, Bld: 112 mg/dL — ABNORMAL HIGH (ref 65–99)
POTASSIUM: 3.9 mmol/L (ref 3.5–5.1)
SODIUM: 140 mmol/L (ref 135–145)

## 2017-06-09 LAB — BRAIN NATRIURETIC PEPTIDE: B NATRIURETIC PEPTIDE 5: 183.3 pg/mL — AB (ref 0.0–100.0)

## 2017-06-09 MED ORDER — FUROSEMIDE 10 MG/ML IJ SOLN
60.0000 mg | Freq: Two times a day (BID) | INTRAMUSCULAR | Status: DC
  Administered 2017-06-09: 60 mg via INTRAVENOUS
  Filled 2017-06-09: qty 6

## 2017-06-09 MED ORDER — FUROSEMIDE 20 MG PO TABS: 60.0000 mg | ORAL_TABLET | Freq: Every day | ORAL | 2 refills | Status: DC

## 2017-06-09 MED ORDER — POTASSIUM CHLORIDE CRYS ER 20 MEQ PO TBCR
20.0000 meq | EXTENDED_RELEASE_TABLET | Freq: Every day | ORAL | Status: DC
  Administered 2017-06-09: 20 meq via ORAL
  Filled 2017-06-09: qty 1

## 2017-06-09 MED ORDER — NITROGLYCERIN 0.4 MG SL SUBL: 0.4000 mg | SUBLINGUAL_TABLET | SUBLINGUAL | 12 refills | Status: DC | PRN

## 2017-06-09 MED ORDER — FUROSEMIDE 20 MG PO TABS: 60.0000 mg | ORAL_TABLET | Freq: Two times a day (BID) | ORAL | 2 refills | Status: DC

## 2017-06-09 MED ORDER — FENOFIBRATE 54 MG PO TABS: 54.0000 mg | ORAL_TABLET | Freq: Every day | ORAL | 6 refills | Status: DC

## 2017-06-09 NOTE — Progress Notes (Signed)
Pt is waiting to be discharged now, vitals stable, diuresing well, intake good, no any complaints of chest pain, will continue to monitor

## 2017-06-09 NOTE — Progress Notes (Signed)
  Echocardiogram 2D Echocardiogram has been performed.  Jennette Dubin 06/09/2017, 2:20 PM

## 2017-06-09 NOTE — Progress Notes (Signed)
Limited echo today   With Pedoff probe from suprasternal notch a flow loop with peak pressures of 84 mm Hg obtained  Not seen with other view s   Not seen weih valsalva   Recomm :  OK to d/c home   Will review with Drs Agustin Cree and [facility] Consider TEE   Pt will need with anesthesia  Had a bad experience at [facility] Send home on 60 Lasix PO with 20 KCl

## 2017-06-09 NOTE — Discharge Summary (Signed)
Pt got discharged to home, discharge instructions provided and patient showed understanding to it, IV taken out,Telemonitor DC,pt left unit in wheelchair with all of the belongings accompanied with husband.

## 2017-06-09 NOTE — Discharge Summary (Signed)
Discharge Summary    Patient ID: Heather Mosley,  MRN: [id number], DOB/AGE: [id number] 49 y.o.  Admit date: 06/07/2017 Discharge date: 06/09/2017  Primary Care Provider: Bertram Millard A Primary Cardiologist: Dr. 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 Tape Rash     History of Present Illness     Heather Mosley is a 51 year old female with past medical history of aortic valve/aortic root replacement with porcine valve, and extending aorta and hemi-arch replacement and 2006, hypertension and obesity. She is followed by Dr. Agustin Cree. Reports she was one with congenital heart defect, requiring aortic root replacement with porcine valve and extending aorta and hemi-arch replacement in 2006. Subsequently developed severe symptomatic prosthetic aortic valve regurgitation and underwent successful TAVR with 26 mm core valve in December 2015 at [facility]. Shortly after undergoing second valve replacement developed PE, and had cardiac arrest secondary to VT. Had ICD placement at [facility] after this event, and placed on Xarelto. States she was on this for one year and then stopped. Has not had any further complications since. Reports having cardiac cath in 2006 and 2015 prior to valve replacement with clean coronaries.  Reports over the past couple weeks she has had intermittent exertional chest tightness and dyspnea. Presented to the office on 06/06/17 with reported symptoms. Denies any orthopnea, LE edema, dizziness, or n/v. Discussed the option of stress testing versus further ischemic workup. Given her body habitus she was scheduled for cardiac catheterization  next Tuesday. Troponin checked in the office on 7/25 reported at 0.07. She was called 7/26 and instructed to come to the [facility] for further evaluation, and to possibly have her cardiac cath moved forward. In the ED her labs showed stable electrolytes, creatinine 0.98, POC troponin 0.01, hemoglobin 11.9. Chest x-ray showed mild pulmonary vascular congestion. EKG shows sinus rhythm with nonspecific T-wave changes. She is currently asymptomatic while at rest, but reports symptoms quickly developed with minimal exertion.   Hospital Course     Consultants: None  She underwent a left heart catheterization on 7/28 . She had no obstructive coronary artery disease. Dr. Tamala Julian did recommendation for repeat echo to document LV systolic function and to exclude the possibility of LV outflow tract dynamic obstruction that could be influencing the pressure gradient 129mm. Her complete echocardiogram did not support the findings on 7/28. Echo revealed EF 55-60%, grade 2 dd.  She was given additional lasix. A repeat echo (limited) was performed today while the patient valsalvad with no significant abnormalities noted. Dr. Harrington Challenger has seen the patient and said she is okay for discharge. Recommends outpatient TEE, will need anesthesia as she had a bad experience at [facility]. Plan to send home on 60 mg lasix PO and  20 meq of kcl.  The patient has had an uncomplicated hospital course and is recovering well. The radial  catheter site is stable. I have emailed our scheduling staff to call patient and arrange close hospital follow-up and schedule TEE. A work excuse note was provided as well. Discharge medications are listed below.  _____________  Discharge Vitals Blood pressure 110/66, pulse 86, temperature 98 F (36.7 C), temperature source Oral, resp. rate  20, height 5\' 1"  (1.549 m), weight (!) 305 lb 8 oz (138.6 kg), last menstrual period 12/27/2016, SpO2 97 %.  Filed Weights   06/07/17 2041 06/08/17 0417 06/09/17 0413    Weight: (!) 310 lb 3.2 oz (140.7 kg) (!) 309 lb 3.2 oz (140.3 kg) (!) 305 lb 8 oz (138.6 kg)    Labs & Radiologic Studies     CBC  Recent Labs  06/07/17 2033 06/08/17 1024  WBC 7.2 5.7  HGB 11.5* 11.0*  HCT 35.5* 34.6*  MCV 86.6 87.2  PLT 193 242   Basic Metabolic Panel  Recent Labs  06/08/17 0328 06/08/17 1024 06/09/17 0646  NA 140  --  140  K 3.8  --  3.9  CL 105  --  105  CO2 27  --  25  GLUCOSE 116*  --  112*  BUN 20  --  17  CREATININE 0.99 0.83 0.93  CALCIUM 9.4  --  9.5   Liver Function Tests No results for input(s): AST, ALT, ALKPHOS, BILITOT, PROT, ALBUMIN in the last 72 hours. No results for input(s): LIPASE, AMYLASE in the last 72 hours. Cardiac Enzymes No results for input(s): CKTOTAL, CKMB, CKMBINDEX, TROPONINI in the last 72 hours. BNP Invalid input(s): POCBNP D-Dimer No results for input(s): DDIMER in the last 72 hours. Hemoglobin A1C No results for input(s): HGBA1C in the last 72 hours. Fasting Lipid Panel No results for input(s): CHOL, HDL, LDLCALC, TRIG, CHOLHDL, LDLDIRECT in the last 72 hours. Thyroid Function Tests No results for input(s): TSH, T4TOTAL, T3FREE, THYROIDAB in the last 72 hours.  Invalid input(s): FREET3  Dg Chest 2 View  Result Date: 06/07/2017 CLINICAL DATA:  Chest pressure. EXAM: CHEST  2 VIEW COMPARISON:  11/03/2014 FINDINGS: Dual lead cardiac pacemaker, median sternotomy changes and vascular stent are unchanged radiographically. The cardiac silhouette is mildly enlarged. Mediastinal contours appear intact. Tortuosity of the aorta. There is no evidence of focal airspace consolidation, pleural effusion or pneumothorax. Mild pulmonary vascular congestion. Osseous structures are without acute abnormality. Soft tissues are grossly normal. IMPRESSION: Enlarged heart. Tortuosity of the thoracic aorta. Mild pulmonary vascular congestion. Electronically Signed   By: Fidela Salisbury M.D.   On: 06/07/2017 13:32     Diagnostic  Studies/Procedures   Limited echocardiogram 7/28   Study Conclusions  - Impressions: Limited echo to evaluate doppler gradients across AV   Valsalva maneuver did not lead to increased gradient   WIth Pedoff probe from suprasternal notch a jet was isolated that   had peak velocity of 4.6 m/sec (85 mm Hg)  Impressions:  - Limited echo to evaluate doppler gradients across AV Valsalva   maneuver did not lead to increased gradient   WIth Pedoff probe from suprasternal notch a jet was isolated that   had peak velocity of 4.6 m/sec (85 mm Hg)   Left heart cath 7/27  Conclusion    Difficult procedure due to body habitus and the presence of a Core-Valve.  Widely patent left and right coronary ostia. No obstructive coronary disease is noted.  Acute on chronic diastolic heart failure with severely elevated left ventricular end-diastolic pressure, 39 mmHg. Suspicion of low normal LV systolic function. LV gram was poor in quality since performed by hand injection given the very high end-diastolic pressure  Dysfunctional Core-Valve with 120 mm gradient documented by pullback across the prosthesis. 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   RECOMMENDATIONS:   Repeat echo to document LV systolic function. Echo should also exclude  the possibility of LV outflow tract dynamic obstruction that could be influencing the pressure gradient recorded on this study.  Overall, the data suggests that her symptoms are related to critical aortic stenosis and suspected dysfunction of the Core Valve. Review of data from 2015 catheterization post valve implantation performed at [facility], no significant gradient was noted. LVEDP was in the same range as the current study.   Complete echocardiogram 7/27 Study Conclusions  - Procedure narrative: Transthoracic echocardiography. Image   quality was suboptimal. The study was technically difficult, as a   result of poor sound  wave transmission, restricted patient   mobility, and body habitus. Intravenous contrast (Definity) was   administered. - Left ventricle: The cavity size was normal. There was moderate   concentric hypertrophy. Systolic function was normal. The   estimated ejection fraction was in the range of 55% to 60%. Wall   motion was normal; there were no regional wall motion   abnormalities. Doppler parameters are consistent with   pseudonormal left ventricular relaxation (grade 2 diastolic   dysfunction). The E/e&' ratio is >15, suggesting elevated LV   filling pressure. - Aortic valve: Poorly visualized. s/p TAVR with 26 mm Core valve.   No obstruction. Mean gradient (S): 3 mm Hg. Peak gradient (S): 6   mm Hg. Valve area (VTI): 2.16 cm^2. Valve area (Vmax): 2.38 cm^2.   Valve area (Vmean): 2.22 cm^2. - Mitral valve: Mildly thickened leaflets . There was mild   regurgitation. - Left atrium: The atrium was mildly dilated. - Right ventricle: The cavity size was mildly dilated. Mildly   reduced systolic function. Pacer or AICD wire noted in right   ventricle. - Right atrium: Moderately dilated. Pacer or AICD wire noted in   right atrium. - Tricuspid valve: There was moderate regurgitation. - Pulmonary arteries: PA peak pressure: 51 mm Hg (S). - Inferior vena cava: The vessel was normal in size. The   respirophasic diameter changes were in the normal range (>= 50%),   consistent with normal central venous pressure.  Impressions:  - Technically difficult study. Definity contrast given. LVEF   55-60%, moderate LVH, normal wall motion, grade 2 DD with high LV   filling pressure, s/p TAVR valve without obstruction, mildMR,   mild LAE, mildly dilated RV with reduced systolic function, AICD   wire noted, moderate RAE, moderate TR, RVSP 51 mmHg, normal IVC.   _____________    Disposition   Pt is being discharged home today in good condition.  Follow-up Plans & Appointments    Follow-up  Information    Park Liter, MD Follow up.   Specialty:  Cardiology Why:  the office will call you early next week to arrange follow-up appointments and TEE (ultrasound of your heart) Contact information: [place] [address] [phone]          Discharge Instructions    Diet - low sodium heart healthy    Complete by:  As directed    Increase activity slowly    Complete by:  As directed    May shower / Bathe    Complete by:  As directed       Discharge Medications   Allergies as of 06/09/2017      Reactions   Tape Rash      Medication List    STOP taking these medications   ranolazine 500 MG 12 hr tablet Commonly known as:  RANEXA     TAKE  these medications   acetaminophen 500 MG tablet Commonly known as:  TYLENOL Take 1,000 mg by mouth 2 (two) times daily as needed for moderate pain or headache.   aspirin EC 81 MG tablet Take 81 mg by mouth daily.   atorvastatin 80 MG tablet Commonly known as:  LIPITOR Take 80 mg by mouth daily.   celecoxib 200 MG capsule Commonly known as:  CELEBREX Take 200 mg by mouth daily.   fenofibrate 54 MG tablet Take 1 tablet (54 mg total) by mouth daily. What changed:  medication strength  how much to take   FISH OIL ULTRA 1400 MG Caps Take 1,400 mg by mouth 2 (two) times daily.   furosemide 20 MG tablet Commonly known as:  LASIX Take 3 tablets (60 mg total) by mouth daily. Take 40 mg in the morning and 20 mg in the evening What changed:  medication strength  how much to take  when to take this   mometasone 50 MCG/ACT nasal spray Commonly known as:  NASONEX Place 2 sprays into the nose daily as needed (allergies).   multivitamin with minerals Tabs tablet Take 1 tablet by mouth at bedtime.   nitroGLYCERIN 0.4 MG SL tablet Commonly known as:  NITROSTAT Place 1 tablet (0.4 mg total) under the tongue every 5 (five) minutes x 3 doses as needed for chest pain.   OSTEO BI-FLEX  JOINT SHIELD Tabs Take 1 tablet by mouth at bedtime.   potassium chloride SA 20 MEQ tablet Commonly known as:  K-DUR,KLOR-CON Take 10 mEq by mouth daily.        Outstanding Labs/Studies   BMP and TEE  Duration of Discharge Encounter   Greater than 30 minutes including physician time.  Kristopher Glee PA-C 06/09/2017, 5:54 PM

## 2017-06-09 NOTE — Progress Notes (Signed)
Removed TR band at 2153. Pressure dressing applied. Level O. Patient alert and stable. No bleeding noted. RUE color, pulse WNL. Will continue to monitor for changes.

## 2017-06-09 NOTE — Progress Notes (Signed)
Progress Note  Patient Name: Heather Mosley Date of Encounter: 06/09/2017  Primary Cardiologist: Agustin Cree  Subjective   Breathing OK  No CP    Inpatient Medications    Scheduled Meds: . aspirin EC  81 mg Oral Daily  . atorvastatin  80 mg Oral q1800  . fenofibrate  54 mg Oral Daily  . fluticasone  1 spray Each Nare Daily  . heparin  5,000 Units Subcutaneous Q8H  . multivitamin with minerals  1 tablet Oral QHS  . omega-3 acid ethyl esters  1,000 mg Oral BID   Continuous Infusions:  PRN Meds: acetaminophen, nitroGLYCERIN, ondansetron (ZOFRAN) IV, oxyCODONE-acetaminophen   Vital Signs    Vitals:   06/08/17 2029 06/09/17 0012 06/09/17 0413 06/09/17 0417  BP: (!) 112/93 (!) 106/57  (!) 109/53  Pulse: 83 77  80  Resp: 18   18  Temp: 98.4 F (36.9 C)   98.7 F (37.1 C)  TempSrc: Oral   Oral  SpO2: 98% 97%  96%  Weight:   (!) 305 lb 8 oz (138.6 kg)   Height:        Intake/Output Summary (Last 24 hours) at 06/09/17 0611 Last data filed at 06/09/17 0414  Gross per 24 hour  Intake                0 ml  Output             2520 ml  Net            -2520 ml   Filed Weights   06/07/17 2041 06/08/17 0417 06/09/17 0413  Weight: (!) 310 lb 3.2 oz (140.7 kg) (!) 309 lb 3.2 oz (140.3 kg) (!) 305 lb 8 oz (138.6 kg)    Telemetry    SR   - Personally Reviewed  ECG      Physical Exam  Morbidly obese 51 yo in NAD   GEN: No acute distress.   Neck: No JVD Cardiac: RRR, III/vi systolici murmur  s, rubs, or gallops.  Respiratory: Clear to auscultation bilaterally. GI: Soft, nontender, non-distended  MS: No edema; No deformity. Neuro:  Nonfocal  Psych: Normal affect   Labs    Chemistry Recent Labs Lab 06/06/17 0913 06/07/17 1128 06/07/17 2033 06/08/17 0328 06/08/17 1024  NA 144 141  --  140  --   K 4.4 3.8  --  3.8  --   CL 107* 104  --  105  --   CO2 20 27  --  27  --   GLUCOSE 110* 129*  --  116*  --   BUN 16 16  --  20  --   CREATININE 0.73 0.98 0.96  0.99 0.83  CALCIUM 9.6 9.9  --  9.4  --   GFRNONAA 98 >60 >60 >60 >60  GFRAA 113 >60 >60 >60 >60  ANIONGAP  --  10  --  8  --      Hematology Recent Labs Lab 06/07/17 1128 06/07/17 2033 06/08/17 1024  WBC 6.1 7.2 5.7  RBC 4.36 4.10 3.97  HGB 11.9* 11.5* 11.0*  HCT 38.2 35.5* 34.6*  MCV 87.6 86.6 87.2  MCH 27.3 28.0 27.7  MCHC 31.2 32.4 31.8  RDW 16.1* 16.2* 16.3*  PLT 182 193 179    Cardiac Enzymes Recent Labs Lab 06/06/17 0926  TROPONINI 0.07*    Recent Labs Lab 06/07/17 1136  TROPIPOC 0.01     BNP Recent Labs Lab 06/07/17 2033  BNP 247.6*  DDimer No results for input(s): DDIMER in the last 168 hours.   Radiology    Dg Chest 2 View  Result Date: 06/07/2017 CLINICAL DATA:  Chest pressure. EXAM: CHEST  2 VIEW COMPARISON:  11/03/2014 FINDINGS: Dual lead cardiac pacemaker, median sternotomy changes and vascular stent are unchanged radiographically. The cardiac silhouette is mildly enlarged. Mediastinal contours appear intact. Tortuosity of the aorta. There is no evidence of focal airspace consolidation, pleural effusion or pneumothorax. Mild pulmonary vascular congestion. Osseous structures are without acute abnormality. Soft tissues are grossly normal. IMPRESSION: Enlarged heart. Tortuosity of the thoracic aorta. Mild pulmonary vascular congestion. Electronically Signed   By: Fidela Salisbury M.D.   On: 06/07/2017 13:32    Cardiac Studies     Patient Profile       Assessment & Plan    1  Dyspnea  Cath yesterday showed Normal coronary arteriea  LVEDP of 38 and marked gradient thorugh LV  (100 mm )  Echo did not support these findings  Obstruction not seen   Will give additional IV lasix today  Follow BP and output Limited echo with valsalva  Eval outflow gradients    2  AV dz  S/p commisuratomy, AVR and TAVR    Signed, Dorris Carnes, MD  06/09/2017, 6:11 AM

## 2017-06-11 ENCOUNTER — Other Ambulatory Visit: Payer: Self-pay | Admitting: Emergency Medicine

## 2017-06-11 ENCOUNTER — Other Ambulatory Visit: Payer: Self-pay | Admitting: *Deleted

## 2017-06-11 ENCOUNTER — Telehealth: Payer: Self-pay

## 2017-06-11 ENCOUNTER — Telehealth: Payer: Self-pay | Admitting: Internal Medicine

## 2017-06-11 DIAGNOSIS — I351 Nonrheumatic aortic (valve) insufficiency: Secondary | ICD-10-CM

## 2017-06-11 DIAGNOSIS — I350 Nonrheumatic aortic (valve) stenosis: Secondary | ICD-10-CM

## 2017-06-11 DIAGNOSIS — Z952 Presence of prosthetic heart valve: Secondary | ICD-10-CM

## 2017-06-11 DIAGNOSIS — I060 Rheumatic aortic stenosis: Secondary | ICD-10-CM

## 2017-06-11 NOTE — Telephone Encounter (Signed)
Pt advised that her cath was canceled for tomorrow per review of her chart.

## 2017-06-11 NOTE — Telephone Encounter (Signed)
Contacted pt  After review I would recomm a Cardiac CT to evaluate AV and also ascending aorta.  Discussed with Dr Meda Coffee who will be one requested to read study Please put this in description:  S/p commissurotomy S/p AVR and aortic hemigraft S/p TAVR Hx of obstruction, increased gradent   Eval valve and aorta  Pt told that someone would be calling to schedule

## 2017-06-11 NOTE — Telephone Encounter (Signed)
-----   Message from Kathyrn Sheriff, RN sent at 06/11/2017  1:32 PM EDT -----   ----- Message ----- From: Damian Leavell, RN Sent: 06/11/2017   7:35 AM To: Park Liter, MD  This lady had her cath, the discharge note recommends an outpatient TEE.  I'm sorry I don't know your nurse, or I would have sent this to you both.  I will cancel her cath scheduled for tomorrow.  Thank you, Myrtie Hawk RN

## 2017-06-11 NOTE — Telephone Encounter (Signed)
Order entered and message sent to [place] .Heather Mosley

## 2017-06-12 ENCOUNTER — Encounter (HOSPITAL_COMMUNITY): Payer: Self-pay

## 2017-06-12 ENCOUNTER — Ambulatory Visit (HOSPITAL_COMMUNITY): Admit: 2017-06-12 | Payer: 59 | Admitting: Cardiology

## 2017-06-12 SURGERY — LEFT HEART CATH AND CORONARY ANGIOGRAPHY
Anesthesia: LOCAL

## 2017-06-13 ENCOUNTER — Telehealth: Payer: Self-pay

## 2017-06-13 ENCOUNTER — Telehealth: Payer: Self-pay | Admitting: Internal Medicine

## 2017-06-13 NOTE — Telephone Encounter (Signed)
Patient calling, asked to speak with you in regards to an order.

## 2017-06-13 NOTE — Telephone Encounter (Signed)
Pt has been given contact information for Dr. Alan Ripper office for the CT.

## 2017-06-13 NOTE — Telephone Encounter (Signed)
Pt aware awaiting pre authorization .Will call once precert received.Pt verbalized understanding ./cy

## 2017-06-14 ENCOUNTER — Encounter: Payer: Self-pay | Admitting: Internal Medicine

## 2017-06-27 ENCOUNTER — Ambulatory Visit (HOSPITAL_COMMUNITY)
Admission: RE | Admit: 2017-06-27 | Discharge: 2017-06-27 | Disposition: A | Discharge status: Home / Self Care | Payer: 59 | Source: Ambulatory Visit | Attending: Internal Medicine | Admitting: Internal Medicine

## 2017-06-27 ENCOUNTER — Encounter (HOSPITAL_COMMUNITY): Payer: Self-pay

## 2017-06-27 DIAGNOSIS — Z953 Presence of xenogenic heart valve: Secondary | ICD-10-CM | POA: Diagnosis not present

## 2017-06-27 DIAGNOSIS — I350 Nonrheumatic aortic (valve) stenosis: Secondary | ICD-10-CM | POA: Diagnosis present

## 2017-06-27 DIAGNOSIS — Z95828 Presence of other vascular implants and grafts: Secondary | ICD-10-CM | POA: Diagnosis not present

## 2017-06-27 DIAGNOSIS — I351 Nonrheumatic aortic (valve) insufficiency: Secondary | ICD-10-CM | POA: Diagnosis not present

## 2017-06-27 DIAGNOSIS — Z952 Presence of prosthetic heart valve: Secondary | ICD-10-CM

## 2017-06-27 DIAGNOSIS — I517 Cardiomegaly: Secondary | ICD-10-CM | POA: Diagnosis not present

## 2017-06-27 MED ORDER — METOPROLOL TARTRATE 5 MG/5ML IV SOLN
5.0000 mg | INTRAVENOUS | Status: DC | PRN
  Administered 2017-06-27 (×3): 5 mg via INTRAVENOUS
  Filled 2017-06-27: qty 5

## 2017-06-27 MED ORDER — METOPROLOL TARTRATE 5 MG/5ML IV SOLN
INTRAVENOUS | Status: AC
  Filled 2017-06-27: qty 5

## 2017-06-27 MED ORDER — METOPROLOL TARTRATE 5 MG/5ML IV SOLN
INTRAVENOUS | Status: AC
  Filled 2017-06-27: qty 10

## 2017-06-27 MED ORDER — IOPAMIDOL (ISOVUE-370) INJECTION 76%
INTRAVENOUS | Status: AC
  Administered 2017-06-27: 80 mL
  Filled 2017-06-27: qty 100

## 2017-06-30 ENCOUNTER — Telehealth: Payer: Self-pay | Admitting: Adult Health

## 2017-06-30 NOTE — Telephone Encounter (Signed)
Patient called due to worsening breathing status. Very complicated cardiac history with TAVR. Recent cardiac cath on 7/27/22018 revealing no CAD, but dysfunctional Core-Valve with 20 mm gradient. Symptoms were thought to be related to critical AoV stenosis. She is due to see her primary cardiologist Dr. Joycelyn Rua on August 23rd.   I have reviewed her medications. I have advised her to take an addition dose of lasix. She is on 40 mg in the am and 20 mg in the pm. I have advised that she take 40 mg BID for the next two days with full dose of potassium 20 mEq daily on those two days. She is to go back to usual dose of lasix thereafter.She should keep her appointment with cardiologist in 4 days.

## 2017-07-04 ENCOUNTER — Ambulatory Visit: Payer: 59 | Admitting: Cardiology

## 2017-07-05 ENCOUNTER — Ambulatory Visit (INDEPENDENT_AMBULATORY_CARE_PROVIDER_SITE_OTHER): Payer: 59 | Admitting: Cardiology

## 2017-07-05 ENCOUNTER — Encounter: Payer: Self-pay | Admitting: Cardiology

## 2017-07-05 VITALS — BP 110/68 | HR 88 | Resp 12 | Ht 61.0 in | Wt 300.4 lb

## 2017-07-05 DIAGNOSIS — Z95810 Presence of automatic (implantable) cardiac defibrillator: Secondary | ICD-10-CM

## 2017-07-05 DIAGNOSIS — R0789 Other chest pain: Secondary | ICD-10-CM

## 2017-07-05 DIAGNOSIS — R0600 Dyspnea, unspecified: Secondary | ICD-10-CM

## 2017-07-05 DIAGNOSIS — Z954 Presence of other heart-valve replacement: Secondary | ICD-10-CM | POA: Diagnosis not present

## 2017-07-05 DIAGNOSIS — R0609 Other forms of dyspnea: Secondary | ICD-10-CM | POA: Diagnosis not present

## 2017-07-05 DIAGNOSIS — Z8674 Personal history of sudden cardiac arrest: Secondary | ICD-10-CM | POA: Diagnosis not present

## 2017-07-05 DIAGNOSIS — I471 Supraventricular tachycardia: Secondary | ICD-10-CM | POA: Diagnosis not present

## 2017-07-05 DIAGNOSIS — Z86711 Personal history of pulmonary embolism: Secondary | ICD-10-CM | POA: Diagnosis not present

## 2017-07-05 DIAGNOSIS — Z952 Presence of prosthetic heart valve: Secondary | ICD-10-CM | POA: Diagnosis not present

## 2017-07-05 DIAGNOSIS — I350 Nonrheumatic aortic (valve) stenosis: Secondary | ICD-10-CM

## 2017-07-05 NOTE — Progress Notes (Signed)
Cardiology Office Note:    Date:  07/05/2017   ID:  Heather Mosley, DOB [birth year], MRN [id number]  PCP:  Mateo Flow, MD  Cardiologist:  Jenne Campus, MD    Referring MD: Mateo Flow, MD   Chief Complaint  Patient presents with  . 4 week follow up  Follow-up after cardiac catheterization  History of Present Illness:    Heather Mosley is a 51 y.o. female  with history of aortic valve problem when she was 51 years old she had aortic valve repair. Many years later and she end up having aortic root replacement by prostatic valve. However in 2015 she showed up with vulvar problem again and end up having TAVR done. After that she suffered from pulmonary emboli and cardiac arrest. At that time she end up getting defibrillator. She's been doing quite well until she showed up in my office about a month ago complaining of shortness of breath and typical angina pectoris. Her biochemical markers were done troponin I was fine to be minimally elevated she was referred for cardiac catheterization. Cardiac catheterization showed clear coronaries however there was significant gradient across aortic valve. After that echocardiogram was done and there was some difficulty trying to determining if the obstruction comes from the follow-up. Valvular structure. Eventually CT of her chest was done which confirmed significant calcification of the aortic valve and confirmed the fact that stenosis comes from the aortic valve. Heather Mosley comes today to talk about options and the situations. We talked in length about that clearly her follow-up need to be fixed I think she will be best served by being sent to [facility] and I will refer her there. She needs multi-disciplinary approach tried to make appropriate decision for this very complicated young lady. I told her not to exercise until we have this problem fixed. I told her simply to take it easy. Check her Chem-7 today to check her potassium and kidney function. I'll schedule  her to see him back in about a month.  Past Medical History:  Diagnosis Date  . AICD (automatic cardioverter/defibrillator) present 2015  . Aortic valvular stenoses    "born w/it"  . Arthritis    "knees" (06/07/2017)  . Congenital heart defect   . Heart murmur   . High cholesterol   . Hypertension   . OSA on CPAP   . Pulmonary embolism ([place]) 2015    Past Surgical History:  Procedure Laterality Date  . ANOMALOUS PULMONARY VENOUS RETURN REPAIR, TOTAL  2015  . AORTIC VALVE REPAIR  1985  . AORTIC VALVE REPLACEMENT  2006   aortic valve/aortic root replacement with porcine valve, and extending aorta and hemi-arch replacement /notes 06/07/2017  . [place]; 2006; 2015   "prior to my ORs those years"  . CARDIAC DEFIBRILLATOR PLACEMENT  2015  . CARDIAC VALVE REPLACEMENT    . CESAREAN SECTION  2008  . LEFT HEART CATH AND CORONARY ANGIOGRAPHY N/A 06/08/2017   Procedure: Left Heart Cath and Coronary Angiography;  Surgeon: Belva Crome, MD;  Location: [place] [facility];  Service: Cardiovascular;  Laterality: N/A;    Current Medications: Current Meds  Medication Sig  . acetaminophen (TYLENOL) 500 MG tablet Take 1,000 mg by mouth 2 (two) times daily as needed for moderate pain or headache.  Marland Kitchen aspirin EC 81 MG tablet Take 81 mg by mouth daily.  Marland Kitchen atorvastatin (LIPITOR) 80 MG tablet Take 80 mg by mouth daily.   . celecoxib (CELEBREX) 200  MG capsule Take 200 mg by mouth daily.  . fenofibrate 54 MG tablet Take 1 tablet (54 mg total) by mouth daily.  . furosemide (LASIX) 20 MG tablet Take 3 tablets (60 mg total) by mouth daily. Take 40 mg in the morning and 20 mg in the evening (Patient taking differently: Take 80 mg by mouth daily. Take 40 mg in the morning and 40 mg in the evening)  . Misc Natural Products (OSTEO BI-FLEX JOINT SHIELD) TABS Take 1 tablet by mouth at bedtime.  . mometasone (NASONEX) 50 MCG/ACT nasal spray Place 2 sprays into the nose daily as needed  (allergies).   . Multiple Vitamin (MULTIVITAMIN WITH MINERALS) TABS tablet Take 1 tablet by mouth at bedtime.  . nitroGLYCERIN (NITROSTAT) 0.4 MG SL tablet Place 1 tablet (0.4 mg total) under the tongue every 5 (five) minutes x 3 doses as needed for chest pain.  . Omega-3 Fatty Acids (FISH OIL ULTRA) 1400 MG CAPS Take 1,400 mg by mouth 2 (two) times daily.  . potassium chloride SA (K-DUR,KLOR-CON) 20 MEQ tablet Take 10 mEq by mouth daily.      Allergies:   Tape   Social History   Social History  . Marital status: Married    Spouse name: N/A  . Number of children: N/A  . Years of education: N/A   Social History Main Topics  . Smoking status: Never Smoker  . Smokeless tobacco: Never Used  . Alcohol use No  . Drug use: No  . Sexual activity: Yes   Other Topics Concern  . None   Social History Narrative  . None     Family History: The patient's family history includes Hyperlipidemia in her father; Hypertension in her mother; Leukemia in her father. ROS:   Please see the history of present illness.    All 14 point review of systems negative except as described per history of present illness  EKGs/Labs/Other Studies Reviewed:      Recent Labs: 06/08/2017: Hemoglobin 11.0; Platelets 179 06/09/2017: B Natriuretic Peptide 183.3; BUN 17; Creatinine, Ser 0.93; Potassium 3.9; Sodium 140  Recent Lipid Panel No results found for: CHOL, TRIG, HDL, CHOLHDL, VLDL, LDLCALC, LDLDIRECT  Physical Exam:    VS:  BP 110/68   Pulse 88   Resp 12   Ht 5\' 1"  (1.549 m)   Wt (!) 300 lb 6.4 oz (136.3 kg)   BMI 56.76 kg/m     Wt Readings from Last 3 Encounters:  07/05/17 (!) 300 lb 6.4 oz (136.3 kg)  06/09/17 (!) 305 lb 8 oz (138.6 kg)  06/06/17 (!) 318 lb (144.2 kg)     GEN:  Well nourished, well developed in no acute distress HEENT: Normal NECK: No JVD; No carotid bruits LYMPHATICS: No lymphadenopathy CARDIAC: RRR, systolic ejection murmur best heard at the right border of the  sternum and right upper portion of the sternum break 3-4/6 with radiation towards the neck especially the left side., no rubs, no gallops RESPIRATORY:  Clear to auscultation without rales, wheezing or rhonchi  ABDOMEN: Soft, non-tender, non-distended MUSCULOSKELETAL:  No edema; No deformity  SKIN: Warm and dry LOWER EXTREMITIES: no swelling NEUROLOGIC:  Alert and oriented x 3 PSYCHIATRIC:  Normal affect   ASSESSMENT:    1. S/P TAVR (transcatheter aortic valve replacement)   2. History of pulmonary embolism   3. History of cardiac arrest   4. Other chest pain   5. Dyspnea on exertion   6. ICD (implantable cardioverter-defibrillator) in place  7. Morbid obesity ([place])   8. 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 Nonrheumatic aortic valve stenosis    PLAN:    In order of problems listed above:  1. Aortic stenosis, status post averaging 2015, status post aortic valve repair when she was 84, status post aortic root replacement with aortic valve replacement. He was facing situation that she has significant aortic stenosis. Plan is as outlined above. I told her not to exercise or exert herself. 2. History of cardiac arrest after have implantation suspect was related to pulmonary emboli. 3. Chest pain which is most likely related to critical aortic stenosis. 4. Dyspnea on exertion: Most likely related to critical aortic stenosis and she was told not to exert herself. 5. The defibrillator which was recently interrogated and functioning properly. 6. Morbid obesity: Noted we'll continue to encourage her to lose weight but again no exercise until we have her bowel 6. 7. Overall she is a very complicated lady we'll send her to tertiary care center for evaluation   Medication Adjustments/Labs and Tests Ordered: Current medicines are reviewed at length with the patient today.  Concerns regarding medicines are outlined above.  No  orders of the defined types were placed in this encounter.  Medication changes: No orders of the defined types were placed in this encounter.   Signed, Park Liter, MD, [facility] 07/05/2017 9:21 AM    [place]

## 2017-07-05 NOTE — Patient Instructions (Signed)
Medication Instructions:  Your physician recommends that you continue on your current medications as directed. Please refer to the Current Medication list given to you today.  Labwork: None   Testing/Procedures: None  Follow-Up: Your physician recommends that you schedule a follow-up appointment in: 1 month   Any Other Special Instructions Will Be Listed Below (If Applicable).  We are going to refer you to Dr. Jenne Pane who works for [facility]. Someone will be reaching out to you regarding an appointment. We are trying to arrange this as soon as possible.   Please note that any paperwork needing to be filled out by the provider will need to be addressed at the front desk prior to seeing the provider. Please note that any paperwork FMLA, Disability or other documents regarding health condition is subject to a $25.00 charge that must be received prior to completion of paperwork in the form of a money order or check.     If you need a refill on your cardiac medications before your next appointment, please call your pharmacy.

## 2017-07-06 LAB — BASIC METABOLIC PANEL
BUN/Creatinine Ratio: 18 (ref 9–23)
BUN: 18 mg/dL (ref 6–24)
CO2: 24 mmol/L (ref 20–29)
Calcium: 9.7 mg/dL (ref 8.7–10.2)
Chloride: 101 mmol/L (ref 96–106)
Creatinine, Ser: 1.02 mg/dL — ABNORMAL HIGH (ref 0.57–1.00)
GFR, EST AFRICAN AMERICAN: 75 mL/min/{1.73_m2} (ref >59)
GFR, EST NON AFRICAN AMERICAN: 65 mL/min/{1.73_m2} (ref >59)
Glucose: 108 mg/dL — ABNORMAL HIGH (ref 65–99)
POTASSIUM: 4.2 mmol/L (ref 3.5–5.2)
SODIUM: 142 mmol/L (ref 134–144)

## 2017-07-09 ENCOUNTER — Telehealth: Payer: Self-pay | Admitting: Cardiology

## 2017-07-09 NOTE — Telephone Encounter (Signed)
Has an appt with a cardiologist at [facility] Thursday morning and states they are wanting her records prior to the appt-would like you to call her as soon as you get in tomorrow(Tuesday)

## 2017-07-12 DIAGNOSIS — E782 Mixed hyperlipidemia: Secondary | ICD-10-CM | POA: Diagnosis not present

## 2017-07-12 DIAGNOSIS — I469 Cardiac arrest, cause unspecified: Secondary | ICD-10-CM | POA: Diagnosis not present

## 2017-07-12 DIAGNOSIS — I471 Supraventricular tachycardia: Secondary | ICD-10-CM | POA: Diagnosis not present

## 2017-07-12 DIAGNOSIS — Q238 Other congenital malformations of aortic and mitral valves: Secondary | ICD-10-CM | POA: Diagnosis not present

## 2017-07-12 DIAGNOSIS — Z4502 Encounter for adjustment and management of automatic implantable cardiac defibrillator: Secondary | ICD-10-CM | POA: Diagnosis not present

## 2017-07-12 DIAGNOSIS — J90 Pleural effusion, not elsewhere classified: Secondary | ICD-10-CM | POA: Diagnosis not present

## 2017-07-12 DIAGNOSIS — Z4682 Encounter for fitting and adjustment of non-vascular catheter: Secondary | ICD-10-CM | POA: Diagnosis not present

## 2017-07-12 DIAGNOSIS — N179 Acute kidney failure, unspecified: Secondary | ICD-10-CM | POA: Diagnosis not present

## 2017-07-12 DIAGNOSIS — T82857A Stenosis of cardiac prosthetic devices, implants and grafts, initial encounter: Secondary | ICD-10-CM | POA: Diagnosis not present

## 2017-07-12 DIAGNOSIS — J811 Chronic pulmonary edema: Secondary | ICD-10-CM | POA: Diagnosis not present

## 2017-07-12 DIAGNOSIS — J9601 Acute respiratory failure with hypoxia: Secondary | ICD-10-CM | POA: Diagnosis not present

## 2017-07-12 DIAGNOSIS — Z953 Presence of xenogenic heart valve: Secondary | ICD-10-CM | POA: Diagnosis not present

## 2017-07-12 DIAGNOSIS — I358 Other nonrheumatic aortic valve disorders: Secondary | ICD-10-CM | POA: Diagnosis not present

## 2017-07-12 DIAGNOSIS — J9811 Atelectasis: Secondary | ICD-10-CM | POA: Diagnosis not present

## 2017-07-12 DIAGNOSIS — R0902 Hypoxemia: Secondary | ICD-10-CM | POA: Diagnosis not present

## 2017-07-12 DIAGNOSIS — Z86711 Personal history of pulmonary embolism: Secondary | ICD-10-CM | POA: Diagnosis not present

## 2017-07-12 DIAGNOSIS — I350 Nonrheumatic aortic (valve) stenosis: Secondary | ICD-10-CM | POA: Diagnosis not present

## 2017-07-12 DIAGNOSIS — I352 Nonrheumatic aortic (valve) stenosis with insufficiency: Secondary | ICD-10-CM | POA: Diagnosis not present

## 2017-07-12 DIAGNOSIS — Z950 Presence of cardiac pacemaker: Secondary | ICD-10-CM | POA: Diagnosis not present

## 2017-07-12 DIAGNOSIS — Z95810 Presence of automatic (implantable) cardiac defibrillator: Secondary | ICD-10-CM | POA: Diagnosis not present

## 2017-07-12 DIAGNOSIS — R918 Other nonspecific abnormal finding of lung field: Secondary | ICD-10-CM | POA: Diagnosis not present

## 2017-07-12 DIAGNOSIS — I5033 Acute on chronic diastolic (congestive) heart failure: Secondary | ICD-10-CM | POA: Diagnosis not present

## 2017-07-12 DIAGNOSIS — I517 Cardiomegaly: Secondary | ICD-10-CM | POA: Diagnosis not present

## 2017-07-12 DIAGNOSIS — Z952 Presence of prosthetic heart valve: Secondary | ICD-10-CM | POA: Diagnosis not present

## 2017-07-12 DIAGNOSIS — J939 Pneumothorax, unspecified: Secondary | ICD-10-CM | POA: Diagnosis not present

## 2017-07-12 DIAGNOSIS — T82897A Other specified complication of cardiac prosthetic devices, implants and grafts, initial encounter: Secondary | ICD-10-CM | POA: Diagnosis not present

## 2017-07-12 DIAGNOSIS — R0609 Other forms of dyspnea: Secondary | ICD-10-CM | POA: Diagnosis not present

## 2017-07-13 DIAGNOSIS — I471 Supraventricular tachycardia: Secondary | ICD-10-CM | POA: Diagnosis not present

## 2017-07-13 DIAGNOSIS — I469 Cardiac arrest, cause unspecified: Secondary | ICD-10-CM | POA: Diagnosis not present

## 2017-07-13 DIAGNOSIS — Z4502 Encounter for adjustment and management of automatic implantable cardiac defibrillator: Secondary | ICD-10-CM | POA: Diagnosis not present

## 2017-07-16 DIAGNOSIS — R0600 Dyspnea, unspecified: Secondary | ICD-10-CM

## 2017-07-16 DIAGNOSIS — R0609 Other forms of dyspnea: Secondary | ICD-10-CM

## 2017-07-16 HISTORY — DX: Other forms of dyspnea: R06.09

## 2017-07-16 HISTORY — DX: Dyspnea, unspecified: R06.00

## 2017-07-24 DIAGNOSIS — R7989 Other specified abnormal findings of blood chemistry: Secondary | ICD-10-CM

## 2017-07-24 DIAGNOSIS — I5189 Other ill-defined heart diseases: Secondary | ICD-10-CM

## 2017-07-24 DIAGNOSIS — R0902 Hypoxemia: Secondary | ICD-10-CM

## 2017-07-24 DIAGNOSIS — N179 Acute kidney failure, unspecified: Secondary | ICD-10-CM

## 2017-07-24 HISTORY — DX: Hypoxemia: R09.02

## 2017-07-24 HISTORY — DX: Acute kidney failure, unspecified: N17.9

## 2017-07-24 HISTORY — DX: Other ill-defined heart diseases: I51.89

## 2017-07-24 HISTORY — DX: Other specified abnormal findings of blood chemistry: R79.89

## 2017-07-27 DIAGNOSIS — Z952 Presence of prosthetic heart valve: Secondary | ICD-10-CM | POA: Insufficient documentation

## 2017-07-27 HISTORY — DX: Presence of prosthetic heart valve: Z95.2

## 2017-08-02 ENCOUNTER — Ambulatory Visit: Payer: 59 | Admitting: Cardiology

## 2017-08-08 DIAGNOSIS — Z7901 Long term (current) use of anticoagulants: Secondary | ICD-10-CM | POA: Diagnosis not present

## 2017-08-10 DIAGNOSIS — Z7901 Long term (current) use of anticoagulants: Secondary | ICD-10-CM | POA: Diagnosis not present

## 2017-08-14 DIAGNOSIS — Z7901 Long term (current) use of anticoagulants: Secondary | ICD-10-CM | POA: Diagnosis not present

## 2017-08-16 DIAGNOSIS — Z7901 Long term (current) use of anticoagulants: Secondary | ICD-10-CM | POA: Diagnosis not present

## 2017-08-17 DIAGNOSIS — Z7901 Long term (current) use of anticoagulants: Secondary | ICD-10-CM | POA: Diagnosis not present

## 2017-08-20 DIAGNOSIS — Z952 Presence of prosthetic heart valve: Secondary | ICD-10-CM | POA: Diagnosis not present

## 2017-08-20 DIAGNOSIS — R918 Other nonspecific abnormal finding of lung field: Secondary | ICD-10-CM | POA: Diagnosis not present

## 2017-08-20 DIAGNOSIS — Z95828 Presence of other vascular implants and grafts: Secondary | ICD-10-CM | POA: Diagnosis not present

## 2017-08-20 DIAGNOSIS — I350 Nonrheumatic aortic (valve) stenosis: Secondary | ICD-10-CM | POA: Diagnosis not present

## 2017-08-23 DIAGNOSIS — Z7901 Long term (current) use of anticoagulants: Secondary | ICD-10-CM | POA: Diagnosis not present

## 2017-08-23 DIAGNOSIS — Z5181 Encounter for therapeutic drug level monitoring: Secondary | ICD-10-CM | POA: Diagnosis not present

## 2017-08-23 DIAGNOSIS — Z954 Presence of other heart-valve replacement: Secondary | ICD-10-CM | POA: Diagnosis not present

## 2017-08-27 DIAGNOSIS — Z5181 Encounter for therapeutic drug level monitoring: Secondary | ICD-10-CM | POA: Diagnosis not present

## 2017-08-27 DIAGNOSIS — Z7901 Long term (current) use of anticoagulants: Secondary | ICD-10-CM | POA: Diagnosis not present

## 2017-09-04 DIAGNOSIS — Z7901 Long term (current) use of anticoagulants: Secondary | ICD-10-CM | POA: Diagnosis not present

## 2017-09-04 DIAGNOSIS — Z5181 Encounter for therapeutic drug level monitoring: Secondary | ICD-10-CM | POA: Diagnosis not present

## 2017-09-07 ENCOUNTER — Ambulatory Visit (INDEPENDENT_AMBULATORY_CARE_PROVIDER_SITE_OTHER): Payer: 59 | Admitting: Cardiology

## 2017-09-07 ENCOUNTER — Encounter: Payer: Self-pay | Admitting: Cardiology

## 2017-09-07 VITALS — BP 110/70 | HR 64 | Resp 14 | Ht 61.0 in | Wt 301.1 lb

## 2017-09-07 DIAGNOSIS — Z952 Presence of prosthetic heart valve: Secondary | ICD-10-CM | POA: Diagnosis not present

## 2017-09-07 DIAGNOSIS — I350 Nonrheumatic aortic (valve) stenosis: Secondary | ICD-10-CM

## 2017-09-07 DIAGNOSIS — Z9889 Other specified postprocedural states: Secondary | ICD-10-CM | POA: Diagnosis not present

## 2017-09-07 DIAGNOSIS — Z95810 Presence of automatic (implantable) cardiac defibrillator: Secondary | ICD-10-CM

## 2017-09-07 DIAGNOSIS — Z954 Presence of other heart-valve replacement: Secondary | ICD-10-CM | POA: Diagnosis not present

## 2017-09-07 NOTE — Progress Notes (Signed)
Cardiology Office Note:    Date:  09/07/2017   ID:  Heather Mosley, DOB July 29, 1966, MRN [id number]  PCP:  Mateo Flow, MD  Cardiologist:  Jenne Campus, MD    Referring MD: Mateo Flow, MD   Chief Complaint  Patient presents with  . Follow up Valve replacement  Post hospital office visit  History of Present Illness:    Heather Mosley is a 51 y.o. female with incredible past medical history.  In summary of very young age she had repair of the aortic valve after that she required aortic root and aortic valve replacement.  Years later she required intervention of her aortic valve and TAVR was implanted.  2 years later which is in the middle of this year she started experiencing chest pain and shortness of breath and she was found to have critical aortic stenosis.  Eventually was referred to [facility] for consideration of valve replacement that surgery was done few weeks ago she got mechanical aortic valve.  I do not have all documentation available to me yet.  However she is coming today to my office for follow-up.  She is doing very well.  She said all soreness is practically gone.  She is feeling dramatically better there is no more chest pain no tightness squeezing pressure burning chest no sweating.  She is very happy and satisfied with the results.  Past Medical History:  Diagnosis Date  . AICD (automatic cardioverter/defibrillator) present 2015  . Aortic valvular stenoses    "born w/it"  . Arthritis    "knees" (06/07/2017)  . Congenital heart defect   . Heart murmur   . High cholesterol   . Hypertension   . OSA on CPAP   . Pulmonary embolism ([place]) 2015    Past Surgical History:  Procedure Laterality Date  . ANOMALOUS PULMONARY VENOUS RETURN REPAIR, TOTAL  2015  . AORTIC VALVE REPAIR  1985  . AORTIC VALVE REPLACEMENT  2006   aortic valve/aortic root replacement with porcine valve, and extending aorta and hemi-arch replacement /notes 06/07/2017  . AORTIC VALVE REPLACEMENT      . [place]; 2006; 2015   "prior to my ORs those years"  . CARDIAC DEFIBRILLATOR PLACEMENT  2015  . CARDIAC VALVE REPLACEMENT    . CESAREAN SECTION  2008  . LEFT HEART CATH AND CORONARY ANGIOGRAPHY N/A 06/08/2017   Procedure: Left Heart Cath and Coronary Angiography;  Surgeon: Belva Crome, MD;  Location: [place] [facility];  Service: Cardiovascular;  Laterality: N/A;    Current Medications: Current Meds  Medication Sig  . acetaminophen (TYLENOL) 325 MG tablet Take 650 mg by mouth 2 (two) times daily as needed for moderate pain or headache.   Marland Kitchen aspirin EC 81 MG tablet Take 81 mg by mouth daily.  Marland Kitchen atorvastatin (LIPITOR) 80 MG tablet Take 80 mg by mouth daily.   . furosemide (LASIX) 20 MG tablet Take 3 tablets (60 mg total) by mouth daily. Take 40 mg in the morning and 20 mg in the evening (Patient taking differently: Take 80 mg by mouth daily. Take 40 mg in the morning and 40 mg in the evening)  . mometasone (NASONEX) 50 MCG/ACT nasal spray Place 2 sprays into the nose daily as needed (allergies).   . nitroGLYCERIN (NITROSTAT) 0.4 MG SL tablet Place 1 tablet (0.4 mg total) under the tongue every 5 (five) minutes x 3 doses as needed for chest pain.  . potassium chloride SA (K-DUR,KLOR-CON)  20 MEQ tablet Take 20 mEq by mouth daily.   Marland Kitchen warfarin (COUMADIN) 5 MG tablet Take 2 tablets by mouth daily.     Allergies:   Tape   Social History   Social History  . Marital status: Married    Spouse name: N/A  . Number of children: N/A  . Years of education: N/A   Social History Main Topics  . Smoking status: Never Smoker  . Smokeless tobacco: Never Used  . Alcohol use No  . Drug use: No  . Sexual activity: Yes   Other Topics Concern  . None   Social History Narrative  . None     Family History: The patient's family history includes Hyperlipidemia in her father; Hypertension in her mother; Leukemia in her father. ROS:   Please see the history of present  illness.    All 14 point review of systems negative except as described per history of present illness  EKGs/Labs/Other Studies Reviewed:      Recent Labs: 06/08/2017: Hemoglobin 11.0; Platelets 179 06/09/2017: B Natriuretic Peptide 183.3 07/05/2017: BUN 18; Creatinine, Ser 1.02; Potassium 4.2; Sodium 142  Recent Lipid Panel No results found for: CHOL, TRIG, HDL, CHOLHDL, VLDL, LDLCALC, LDLDIRECT  Physical Exam:    VS:  BP 110/70   Pulse 64   Resp 14   Ht 5\' 1"  (1.549 m)   Wt (!) 301 lb 1.9 oz (136.6 kg)   BMI 56.90 kg/m     Wt Readings from Last 3 Encounters:  09/07/17 (!) 301 lb 1.9 oz (136.6 kg)  07/05/17 (!) 300 lb 6.4 oz (136.3 kg)  06/09/17 (!) 305 lb 8 oz (138.6 kg)     GEN:  Well nourished, well developed in no acute distress HEENT: Normal NECK: No JVD; No carotid bruits LYMPHATICS: No lymphadenopathy CARDIAC: RRR, mechanical valve sounds are very crisp's.  There is a very soft systolic murmur grade 1/6 best heard at the right upper portion of the sternum, no rubs, no gallops RESPIRATORY:  Clear to auscultation without rales, wheezing or rhonchi  ABDOMEN: Soft, non-tender, non-distended MUSCULOSKELETAL:  No edema; No deformity  SKIN: Warm and dry LOWER EXTREMITIES: no swelling NEUROLOGIC:  Alert and oriented x 3 PSYCHIATRIC:  Normal affect   ASSESSMENT:    1. Status post aortic valve repair   2. ICD (implantable cardioverter-defibrillator) in place   3. S/P TAVR (transcatheter aortic valve replacement)   4. 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 Morbid obesity ([place])   6. Nonrheumatic aortic valve stenosis    PLAN:    In order of problems listed above:  1. Status post aortic valve replacement recent surgery.  She is recovering after surgery grates is feeling much better.  Seems to be compensated. 2. ICD present.  No recent interrogation but will interrogate device 3. Morbid obesity: Obviously  a problem apparently she had poor appetite around surgical time but now appetite is back and started eating more.  I gave him so her instruction and advice is to avoid excess calories since weight loss will be extremely beneficial to her. 4. She still on diuretic and we will check her Chem-7 as well as proBNP today.  She does weight herself every single day I anticipate future discontinuation of at least reducing the dose of diuretic. 5. History of cardiac arrest: She does have ICD.  No recent dizziness or syncope.  Overall lady with amazing story doing quite well very pleased  with the progress she is very pleased with the care she received at [facility].  I see her back in my office in about a month and a half at that time we will get echocardiogram as baseline will request records from [facility]   Medication Adjustments/Labs and Tests Ordered: Current medicines are reviewed at length with the patient today.  Concerns regarding medicines are outlined above.  Orders Placed This Encounter  Procedures  . INR/PT  . Basic metabolic panel  . Pro b natriuretic peptide (BNP)   Medication changes: No orders of the defined types were placed in this encounter.   Signed, Park Liter, MD, [facility] 09/07/2017 12:59 PM    [place]

## 2017-09-07 NOTE — Patient Instructions (Signed)
Medication Instructions:  Your physician recommends that you continue on your current medications as directed. Please refer to the Current Medication list given to you today.  1. Avoid all over-the-counter antihistamines except Claritin/Loratadine and Zyrtec/Cetrizine. 2. Avoid all combination including cold sinus allergies flu decongestant and sleep medications 3. You can use Robitussin DM Mucinex and Mucinex DM for cough. 4. can use Tylenol aspirin ibuprofen and naproxen but no combinations such as sleep or sinus.  Labwork: Your physician recommends that you have lab work today to check your fluid congestion level, pt/inr and Kidney function as well as sodium level and potassium.   Testing/Procedures: None   Follow-Up: Your physician recommends that you schedule a follow-up appointment in: 1 month   Any Other Special Instructions Will Be Listed Below (If Applicable).  Please note that any paperwork needing to be filled out by the provider will need to be addressed at the front desk prior to seeing the provider. Please note that any paperwork FMLA, Disability or other documents regarding health condition is subject to a $25.00 charge that must be received prior to completion of paperwork in the form of a money order or check.    If you need a refill on your cardiac medications before your next appointment, please call your pharmacy.

## 2017-09-08 LAB — PROTIME-INR
INR: 1.7 — ABNORMAL HIGH (ref 0.8–1.2)
Prothrombin Time: 17.3 s — ABNORMAL HIGH (ref 9.1–12.0)

## 2017-09-08 LAB — BASIC METABOLIC PANEL
BUN/Creatinine Ratio: 17 (ref 9–23)
BUN: 11 mg/dL (ref 6–24)
CALCIUM: 9.6 mg/dL (ref 8.7–10.2)
CHLORIDE: 102 mmol/L (ref 96–106)
CO2: 23 mmol/L (ref 20–29)
Creatinine, Ser: 0.63 mg/dL (ref 0.57–1.00)
GFR calc non Af Amer: 106 mL/min/{1.73_m2} (ref >59)
GFR, EST AFRICAN AMERICAN: 123 mL/min/{1.73_m2} (ref >59)
Glucose: 147 mg/dL — ABNORMAL HIGH (ref 65–99)
POTASSIUM: 3.9 mmol/L (ref 3.5–5.2)
Sodium: 141 mmol/L (ref 134–144)

## 2017-09-08 LAB — PRO B NATRIURETIC PEPTIDE: NT-Pro BNP: 386 pg/mL — ABNORMAL HIGH (ref 0–249)

## 2017-09-11 ENCOUNTER — Telehealth: Payer: Self-pay | Admitting: Pharmacist

## 2017-09-11 ENCOUNTER — Ambulatory Visit (INDEPENDENT_AMBULATORY_CARE_PROVIDER_SITE_OTHER): Payer: 59 | Admitting: Pharmacist

## 2017-09-11 DIAGNOSIS — Z5181 Encounter for therapeutic drug level monitoring: Secondary | ICD-10-CM

## 2017-09-11 DIAGNOSIS — Z952 Presence of prosthetic heart valve: Secondary | ICD-10-CM

## 2017-09-11 DIAGNOSIS — Z95810 Presence of automatic (implantable) cardiac defibrillator: Secondary | ICD-10-CM | POA: Diagnosis not present

## 2017-09-11 DIAGNOSIS — Z86711 Personal history of pulmonary embolism: Secondary | ICD-10-CM

## 2017-09-11 DIAGNOSIS — I10 Essential (primary) hypertension: Secondary | ICD-10-CM | POA: Diagnosis not present

## 2017-09-11 HISTORY — DX: Encounter for therapeutic drug level monitoring: Z51.81

## 2017-09-11 NOTE — Telephone Encounter (Signed)
Heather Sheriff, RN  [name], [name], [facility]        [name],   So the above pt was I believe just started on warfarin while at [facility] having valve repair. Dr. Raliegh Ip said she needs adjustments and we will need to manage her. She has a very extensive hx so if you need to know anything just let me know!   Thanks,  Corey Skains, RN   Previous Messages    ----- Message -----  From: Park Liter, MD  Sent: 09/11/2017 11:38 AM  To: Heather Sheriff, RN   Increase coumadin, 2-3 is target, rest ok, cont same meds

## 2017-09-12 ENCOUNTER — Telehealth: Payer: Self-pay | Admitting: Cardiology

## 2017-09-12 DIAGNOSIS — Z79899 Other long term (current) drug therapy: Secondary | ICD-10-CM

## 2017-09-12 MED ORDER — POTASSIUM CHLORIDE ER 10 MEQ PO TBCR: 10.0000 meq | EXTENDED_RELEASE_TABLET | Freq: Every day | ORAL | 11 refills | Status: DC

## 2017-09-12 MED ORDER — FUROSEMIDE 20 MG PO TABS: 40.0000 mg | ORAL_TABLET | Freq: Every day | ORAL | 11 refills | Status: DC

## 2017-09-12 NOTE — Telephone Encounter (Signed)
Please call patient regarding her potassium and which one to take.Marland KitchenMarland Kitchen

## 2017-09-12 NOTE — Addendum Note (Signed)
Addended by: Kathyrn Sheriff on: 09/12/2017 02:07 PM   Modules accepted: Orders

## 2017-09-12 NOTE — Telephone Encounter (Signed)
Refills for medication have been sent and also follow up labs have been arranged to be done again in 1 week.

## 2017-09-14 DIAGNOSIS — Z952 Presence of prosthetic heart valve: Secondary | ICD-10-CM | POA: Diagnosis not present

## 2017-09-18 ENCOUNTER — Telehealth: Payer: Self-pay | Admitting: Cardiology

## 2017-09-18 NOTE — Telephone Encounter (Signed)
Please call pateint regarding a" very Medical" question that she wants to talk to the RN about or Dr. Agustin Cree... Please call her.

## 2017-09-18 NOTE — Telephone Encounter (Signed)
Issue has been addressed and resolved. Pt aware.

## 2017-09-18 NOTE — Telephone Encounter (Signed)
Reached out to [facility] regarding pts concern with the dx of heart failure.

## 2017-09-19 DIAGNOSIS — Z79899 Other long term (current) drug therapy: Secondary | ICD-10-CM | POA: Diagnosis not present

## 2017-09-19 LAB — BASIC METABOLIC PANEL
BUN / CREAT RATIO: 19 (ref 9–23)
BUN: 13 mg/dL (ref 6–24)
CALCIUM: 9.9 mg/dL (ref 8.7–10.2)
CO2: 26 mmol/L (ref 20–29)
Chloride: 104 mmol/L (ref 96–106)
Creatinine, Ser: 0.67 mg/dL (ref 0.57–1.00)
GFR, EST AFRICAN AMERICAN: 119 mL/min/{1.73_m2} (ref >59)
GFR, EST NON AFRICAN AMERICAN: 104 mL/min/{1.73_m2} (ref >59)
Glucose: 107 mg/dL — ABNORMAL HIGH (ref 65–99)
Potassium: 4.4 mmol/L (ref 3.5–5.2)
Sodium: 140 mmol/L (ref 134–144)

## 2017-09-25 ENCOUNTER — Ambulatory Visit (INDEPENDENT_AMBULATORY_CARE_PROVIDER_SITE_OTHER): Payer: 59 | Admitting: Cardiology

## 2017-09-25 ENCOUNTER — Other Ambulatory Visit: Payer: Self-pay

## 2017-09-25 DIAGNOSIS — Z5181 Encounter for therapeutic drug level monitoring: Secondary | ICD-10-CM | POA: Diagnosis not present

## 2017-09-25 DIAGNOSIS — Z952 Presence of prosthetic heart valve: Secondary | ICD-10-CM

## 2017-09-25 DIAGNOSIS — Z86711 Personal history of pulmonary embolism: Secondary | ICD-10-CM | POA: Diagnosis not present

## 2017-09-25 LAB — PROTIME-INR
INR: 1.8 — ABNORMAL HIGH (ref 0.8–1.2)
PROTHROMBIN TIME: 19.7 s — AB (ref 9.1–12.0)

## 2017-09-25 NOTE — Patient Instructions (Signed)
Spoke with pt and advised her to take 2.5 tablets today, then start taking 2 tablets daily except 2.5 tablets on Mondays, Wednesdays and Fridays. Recheck INR in 2 weeks.

## 2017-09-25 NOTE — Addendum Note (Signed)
Addended by: Mattie Marlin on: 09/25/2017 09:39 AM   Modules accepted: Orders

## 2017-09-25 NOTE — Progress Notes (Unsigned)
t

## 2017-09-25 NOTE — Addendum Note (Signed)
Addended by: Mattie Marlin on: 09/25/2017 09:42 AM   Modules accepted: Orders

## 2017-10-05 DIAGNOSIS — I469 Cardiac arrest, cause unspecified: Secondary | ICD-10-CM | POA: Diagnosis not present

## 2017-10-05 DIAGNOSIS — Z4502 Encounter for adjustment and management of automatic implantable cardiac defibrillator: Secondary | ICD-10-CM | POA: Diagnosis not present

## 2017-10-08 ENCOUNTER — Telehealth: Payer: Self-pay | Admitting: Cardiology

## 2017-10-08 MED ORDER — WARFARIN SODIUM 5 MG PO TABS: ORAL_TABLET | ORAL | 1 refills | Status: DC

## 2017-10-08 NOTE — Telephone Encounter (Signed)
Refill sent to pharmacy.   

## 2017-10-08 NOTE — Telephone Encounter (Signed)
Message routed to coumadin clinic for refills.

## 2017-10-08 NOTE — Addendum Note (Signed)
Addended by: SUPPLE, MEGAN E on: 10/08/2017 02:17 PM   Modules accepted: Orders

## 2017-10-08 NOTE — Telephone Encounter (Signed)
Has warfrin prescription from [facility] and wants to know if Dr Raliegh Ip wants to take on the refills

## 2017-10-11 ENCOUNTER — Ambulatory Visit (INDEPENDENT_AMBULATORY_CARE_PROVIDER_SITE_OTHER): Payer: 59 | Admitting: *Deleted

## 2017-10-11 ENCOUNTER — Ambulatory Visit (INDEPENDENT_AMBULATORY_CARE_PROVIDER_SITE_OTHER): Payer: 59 | Admitting: Cardiology

## 2017-10-11 ENCOUNTER — Encounter: Payer: Self-pay | Admitting: Cardiology

## 2017-10-11 VITALS — BP 112/72 | HR 56 | Resp 12 | Ht 61.0 in | Wt 309.0 lb

## 2017-10-11 DIAGNOSIS — Z952 Presence of prosthetic heart valve: Secondary | ICD-10-CM

## 2017-10-11 DIAGNOSIS — R0609 Other forms of dyspnea: Secondary | ICD-10-CM

## 2017-10-11 DIAGNOSIS — Z86711 Personal history of pulmonary embolism: Secondary | ICD-10-CM

## 2017-10-11 DIAGNOSIS — Z5181 Encounter for therapeutic drug level monitoring: Secondary | ICD-10-CM

## 2017-10-11 DIAGNOSIS — Z95810 Presence of automatic (implantable) cardiac defibrillator: Secondary | ICD-10-CM | POA: Diagnosis not present

## 2017-10-11 DIAGNOSIS — R0600 Dyspnea, unspecified: Secondary | ICD-10-CM

## 2017-10-11 LAB — POCT INR: INR: 1.9

## 2017-10-11 NOTE — Patient Instructions (Signed)
Medication Instructions:  Your physician recommends that you continue on your current medications as directed. Please refer to the Current Medication list given to you today.  Labwork: Your physician recommends that you return for lab work: When you have your Echo,come upstairs for a fasting Lipid panel. Today we will check your INR  Testing/Procedures: EKG today  Your physician has requested that you have an echocardiogram. Echocardiography is a painless test that uses sound waves to create images of your heart. It provides your doctor with information about the size and shape of your heart and how well your heart's chambers and valves are working. This procedure takes approximately one hour. There are no restrictions for this procedure.  Follow-Up: Your physician recommends that you schedule a follow-up appointment in: 3 months with Dr. Agustin Cree  Any Other Special Instructions Will Be Listed Below (If Applicable).     If you need a refill on your cardiac medications before your next appointment, please call your pharmacy.

## 2017-10-11 NOTE — Addendum Note (Signed)
Addended by: Aleatha Borer on: 10/11/2017 10:03 AM   Modules accepted: Orders

## 2017-10-11 NOTE — Progress Notes (Signed)
Cardiology Office Note:    Date:  10/11/2017   ID:  Heather Mosley, DOB 11/15/1965, MRN [id number]  PCP:  Mateo Flow, MD  Cardiologist:  Jenne Campus, MD    Referring MD: Mateo Flow, MD   Chief Complaint  Patient presents with  . 1 month follow up  Doing well  History of Present Illness:    Heather Mosley is a 52 y.o. female with very complex past medical history in September 11 she had [place]  procedure with [facility] Jude 21 mm aortic valve implantation.  Recovering very nicely goes to rehab on the regular basis.  Denies having any symptoms no chest pain tightness squeezing pressure burning chest.  Overall she is doing well.  Past Medical History:  Diagnosis Date  . AICD (automatic cardioverter/defibrillator) present 2015  . Aortic valvular stenoses    "born w/it"  . Arthritis    "knees" (06/07/2017)  . Congenital heart defect   . Heart murmur   . High cholesterol   . Hypertension   . OSA on CPAP   . Pulmonary embolism ([place]) 2015    Past Surgical History:  Procedure Laterality Date  . ANOMALOUS PULMONARY VENOUS RETURN REPAIR, TOTAL  2015  . AORTIC VALVE REPAIR  1985  . AORTIC VALVE REPLACEMENT  2006   aortic valve/aortic root replacement with porcine valve, and extending aorta and hemi-arch replacement /notes 06/07/2017  . AORTIC VALVE REPLACEMENT    . [place]; 2006; 2015   "prior to my ORs those years"  . CARDIAC DEFIBRILLATOR PLACEMENT  2015  . CARDIAC VALVE REPLACEMENT    . CESAREAN SECTION  2008  . LEFT HEART CATH AND CORONARY ANGIOGRAPHY N/A 06/08/2017   Procedure: Left Heart Cath and Coronary Angiography;  Surgeon: Belva Crome, MD;  Location: [place] [facility];  Service: Cardiovascular;  Laterality: N/A;    Current Medications: Current Meds  Medication Sig  . acetaminophen (TYLENOL) 325 MG tablet Take 650 mg by mouth 2 (two) times daily as needed for moderate pain or headache.   Marland Kitchen aspirin EC 81 MG tablet Take 81 mg by mouth  daily.  Marland Kitchen atorvastatin (LIPITOR) 80 MG tablet Take 80 mg by mouth daily.   . furosemide (LASIX) 20 MG tablet Take 2 tablets (40 mg total) by mouth daily.  . Glucosamine 500 MG CAPS Take 2 capsules by mouth daily.  . mometasone (NASONEX) 50 MCG/ACT nasal spray Place 2 sprays into the nose daily as needed (allergies).   . Multiple Vitamin (MULTIVITAMIN WITH MINERALS) TABS tablet Take 1 tablet by mouth at bedtime.  . nitroGLYCERIN (NITROSTAT) 0.4 MG SL tablet Place 1 tablet (0.4 mg total) under the tongue every 5 (five) minutes x 3 doses as needed for chest pain.  . potassium chloride (K-DUR) 10 MEQ tablet Take 1 tablet (10 mEq total) by mouth daily.  Marland Kitchen warfarin (COUMADIN) 5 MG tablet Take 2 to 2.5 tablets daily as directed by Coumadin clinic     Allergies:   Tape   Social History   Socioeconomic History  . Marital status: Married    Spouse name: None  . Number of children: None  . Years of education: None  . Highest education level: None  Social Needs  . Financial resource strain: None  . Food insecurity - worry: None  . Food insecurity - inability: None  . Transportation needs - medical: None  . Transportation needs - non-medical: None  Occupational History  . None  Tobacco Use  . Smoking status: Never Smoker  . Smokeless tobacco: Never Used  Substance and Sexual Activity  . Alcohol use: No  . Drug use: No  . Sexual activity: Yes  Other Topics Concern  . None  Social History Narrative  . None     Family History: The patient's family history includes Hyperlipidemia in her father; Hypertension in her mother; Leukemia in her father. ROS:   Please see the history of present illness.    All 14 point review of systems negative except as described per history of present illness  EKGs/Labs/Other Studies Reviewed:      Recent Labs: 06/08/2017: Hemoglobin 11.0; Platelets 179 06/09/2017: B Natriuretic Peptide 183.3 09/07/2017: NT-Pro BNP 386 09/19/2017: BUN 13; Creatinine,  Ser 0.67; Potassium 4.4; Sodium 140  Recent Lipid Panel No results found for: CHOL, TRIG, HDL, CHOLHDL, VLDL, LDLCALC, LDLDIRECT  Physical Exam:    VS:  BP 112/72   Pulse (!) 56   Resp 12   Ht 5\' 1"  (1.549 m)   Wt (!) 309 lb (140.2 kg)   BMI 58.39 kg/m     Wt Readings from Last 3 Encounters:  10/11/17 (!) 309 lb (140.2 kg)  09/07/17 (!) 301 lb 1.9 oz (136.6 kg)  07/05/17 (!) 300 lb 6.4 oz (136.3 kg)     GEN:  Well nourished, well developed in no acute distress HEENT: Normal NECK: No JVD; No carotid bruits LYMPHATICS: No lymphadenopathy CARDIAC: RRR, closing click heard and its crisps, there is soft systolic ejection murmur grade 1/6., no rubs, no gallops RESPIRATORY:  Clear to auscultation without rales, wheezing or rhonchi  ABDOMEN: Soft, non-tender, non-distended MUSCULOSKELETAL:  No edema; No deformity  SKIN: Warm and dry LOWER EXTREMITIES: no swelling NEUROLOGIC:  Alert and oriented x 3 PSYCHIATRIC:  Normal affect   ASSESSMENT:    1. Aortic valve replaced   2. Dyspnea on exertion   3. S/P ICD (internal cardiac defibrillator) procedure    PLAN:    In order of problems listed above:  1. Aortic valve replacement with St. Jude's 21 mm valve.  Will fingerprints to follow.  She will have echo done.  EKG will be done as well.  We will check her INR today INR should be maintaining a target of 2.5. 2. Dyspnea on exertion: Denies having any. 3. ICD being checked by group from [facility].  I did review recent interrogation looks good. 4. Dyslipidemia: We will check her fasting lipid profile and she will come here for echo.  In the meantime we will maintain her on Lipitor 80.   Medication Adjustments/Labs and Tests Ordered: Current medicines are reviewed at length with the patient today.  Concerns regarding medicines are outlined above.  No orders of the defined types were placed in this encounter.  Medication changes: No orders of the defined types were placed in this  encounter.   Signed, Park Liter, MD, [facility] 10/11/2017 9:47 AM    [place]

## 2017-10-11 NOTE — Patient Instructions (Addendum)
Today take 2.5 tablets, then continue  taking 2 tablets daily except 2.5 tablets on Mondays, Wednesdays and Fridays. Recheck INR in 2 weeks.

## 2017-10-15 DIAGNOSIS — Z952 Presence of prosthetic heart valve: Secondary | ICD-10-CM | POA: Diagnosis not present

## 2017-10-25 ENCOUNTER — Ambulatory Visit (INDEPENDENT_AMBULATORY_CARE_PROVIDER_SITE_OTHER): Payer: 59 | Admitting: Pharmacist

## 2017-10-25 DIAGNOSIS — Z5181 Encounter for therapeutic drug level monitoring: Secondary | ICD-10-CM | POA: Diagnosis not present

## 2017-10-25 DIAGNOSIS — Z952 Presence of prosthetic heart valve: Secondary | ICD-10-CM | POA: Diagnosis not present

## 2017-10-25 DIAGNOSIS — Z86711 Personal history of pulmonary embolism: Secondary | ICD-10-CM

## 2017-10-25 LAB — POCT INR: INR: 2

## 2017-10-25 NOTE — Patient Instructions (Signed)
Continue  taking 2 tablets daily except 2.5 tablets on Mondays, Wednesdays and Fridays. Recheck INR in 3 weeks.

## 2017-10-31 ENCOUNTER — Other Ambulatory Visit (HOSPITAL_BASED_OUTPATIENT_CLINIC_OR_DEPARTMENT_OTHER): Payer: 59

## 2017-10-31 ENCOUNTER — Telehealth: Payer: Self-pay | Admitting: Cardiology

## 2017-10-31 DIAGNOSIS — Z4502 Encounter for adjustment and management of automatic implantable cardiac defibrillator: Secondary | ICD-10-CM | POA: Diagnosis not present

## 2017-10-31 DIAGNOSIS — I469 Cardiac arrest, cause unspecified: Secondary | ICD-10-CM | POA: Diagnosis not present

## 2017-10-31 NOTE — Telephone Encounter (Signed)
Advised patient message has been sent to the coumadin clinic and I would let her know the response once received. Patient verbalized understanding.

## 2017-10-31 NOTE — Telephone Encounter (Signed)
Patient would possibly like to do in home coumadin checks and wants to know if she would be a canididate??

## 2017-10-31 NOTE — Telephone Encounter (Signed)
Left message to return call 

## 2017-10-31 NOTE — Telephone Encounter (Signed)
Patient advised per coumadin clinic we are not starting patients on home checks for coumadin at this time. Patient verbalized understanding. No further questions.

## 2017-11-01 ENCOUNTER — Other Ambulatory Visit: Payer: Self-pay

## 2017-11-01 ENCOUNTER — Ambulatory Visit (HOSPITAL_BASED_OUTPATIENT_CLINIC_OR_DEPARTMENT_OTHER)
Admission: RE | Admit: 2017-11-01 | Discharge: 2017-11-01 | Disposition: A | Discharge status: Home / Self Care | Payer: 59 | Source: Ambulatory Visit | Attending: Cardiology | Admitting: Cardiology

## 2017-11-01 DIAGNOSIS — R0609 Other forms of dyspnea: Secondary | ICD-10-CM | POA: Diagnosis not present

## 2017-11-01 DIAGNOSIS — Z95810 Presence of automatic (implantable) cardiac defibrillator: Secondary | ICD-10-CM

## 2017-11-01 DIAGNOSIS — Z952 Presence of prosthetic heart valve: Secondary | ICD-10-CM

## 2017-11-01 DIAGNOSIS — R0600 Dyspnea, unspecified: Secondary | ICD-10-CM

## 2017-11-01 DIAGNOSIS — Z9889 Other specified postprocedural states: Secondary | ICD-10-CM | POA: Insufficient documentation

## 2017-11-01 NOTE — Progress Notes (Signed)
  Echocardiogram 2D Echocardiogram has been performed. Contrast use was declined per patient.  Heather Mosley 11/01/2017, 9:18 AM

## 2017-11-02 LAB — LIPID PANEL
CHOLESTEROL TOTAL: 176 mg/dL (ref 100–199)
Chol/HDL Ratio: 3.8 ratio (ref 0.0–4.4)
HDL: 46 mg/dL (ref >39)
LDL Calculated: 81 mg/dL (ref 0–99)
Triglycerides: 245 mg/dL — ABNORMAL HIGH (ref 0–149)
VLDL CHOLESTEROL CAL: 49 mg/dL — AB (ref 5–40)

## 2017-11-12 ENCOUNTER — Ambulatory Visit (INDEPENDENT_AMBULATORY_CARE_PROVIDER_SITE_OTHER): Payer: 59 | Admitting: Pharmacist

## 2017-11-12 DIAGNOSIS — Z5181 Encounter for therapeutic drug level monitoring: Secondary | ICD-10-CM

## 2017-11-12 DIAGNOSIS — Z86711 Personal history of pulmonary embolism: Secondary | ICD-10-CM | POA: Diagnosis not present

## 2017-11-12 DIAGNOSIS — Z952 Presence of prosthetic heart valve: Secondary | ICD-10-CM | POA: Diagnosis not present

## 2017-11-12 LAB — POCT INR: INR: 2.2

## 2017-11-12 NOTE — Patient Instructions (Signed)
Description   Continue  taking 2 tablets daily except 2.5 tablets on Mondays, Wednesdays and Fridays. Recheck INR in 4 weeks.

## 2017-11-28 DIAGNOSIS — Z0289 Encounter for other administrative examinations: Secondary | ICD-10-CM | POA: Diagnosis not present

## 2017-12-11 ENCOUNTER — Other Ambulatory Visit: Payer: Self-pay | Admitting: Cardiology

## 2017-12-11 ENCOUNTER — Ambulatory Visit (INDEPENDENT_AMBULATORY_CARE_PROVIDER_SITE_OTHER): Payer: 59 | Admitting: Cardiology

## 2017-12-11 DIAGNOSIS — Z952 Presence of prosthetic heart valve: Secondary | ICD-10-CM

## 2017-12-11 DIAGNOSIS — Z5181 Encounter for therapeutic drug level monitoring: Secondary | ICD-10-CM | POA: Diagnosis not present

## 2017-12-11 DIAGNOSIS — Z124 Encounter for screening for malignant neoplasm of cervix: Secondary | ICD-10-CM | POA: Diagnosis not present

## 2017-12-11 DIAGNOSIS — Z86711 Personal history of pulmonary embolism: Secondary | ICD-10-CM

## 2017-12-11 DIAGNOSIS — Z01419 Encounter for gynecological examination (general) (routine) without abnormal findings: Secondary | ICD-10-CM | POA: Diagnosis not present

## 2017-12-11 LAB — POCT INR: INR: 2.3

## 2017-12-11 NOTE — Patient Instructions (Signed)
Description   Continue  taking 2 tablets daily except 2.5 tablets on Mondays, Wednesdays and Fridays. Recheck INR in 4 weeks.

## 2017-12-11 NOTE — Telephone Encounter (Signed)
Requested from [place] by Merrilee Seashore, [place] today

## 2017-12-17 ENCOUNTER — Encounter (INDEPENDENT_AMBULATORY_CARE_PROVIDER_SITE_OTHER): Payer: Self-pay

## 2018-01-04 DIAGNOSIS — I469 Cardiac arrest, cause unspecified: Secondary | ICD-10-CM | POA: Diagnosis not present

## 2018-01-04 DIAGNOSIS — Z4502 Encounter for adjustment and management of automatic implantable cardiac defibrillator: Secondary | ICD-10-CM | POA: Diagnosis not present

## 2018-01-09 ENCOUNTER — Ambulatory Visit (INDEPENDENT_AMBULATORY_CARE_PROVIDER_SITE_OTHER): Payer: 59 | Admitting: *Deleted

## 2018-01-09 ENCOUNTER — Encounter: Payer: Self-pay | Admitting: Cardiology

## 2018-01-09 ENCOUNTER — Ambulatory Visit (INDEPENDENT_AMBULATORY_CARE_PROVIDER_SITE_OTHER): Payer: 59 | Admitting: Cardiology

## 2018-01-09 VITALS — BP 110/60 | HR 95 | Ht 61.0 in | Wt 318.0 lb

## 2018-01-09 DIAGNOSIS — Z95810 Presence of automatic (implantable) cardiac defibrillator: Secondary | ICD-10-CM

## 2018-01-09 DIAGNOSIS — Z952 Presence of prosthetic heart valve: Secondary | ICD-10-CM

## 2018-01-09 DIAGNOSIS — Z86711 Personal history of pulmonary embolism: Secondary | ICD-10-CM

## 2018-01-09 DIAGNOSIS — Z954 Presence of other heart-valve replacement: Secondary | ICD-10-CM

## 2018-01-09 DIAGNOSIS — Z5181 Encounter for therapeutic drug level monitoring: Secondary | ICD-10-CM

## 2018-01-09 LAB — POCT INR: INR: 2.3

## 2018-01-09 NOTE — Patient Instructions (Addendum)
Description   Continue taking 2 tablets daily except 2.5 tablets on Mondays, Wednesdays and Fridays. Recheck INR in 6 weeks.

## 2018-01-09 NOTE — Patient Instructions (Signed)
Medication Instructions:  Your physician recommends that you continue on your current medications as directed. Please refer to the Current Medication list given to you today.  Labwork: None ordered  Testing/Procedures: None ordered  Follow-Up: Your physician recommends that you schedule a follow-up appointment in: 3 months with Dr. Krasowski   Any Other Special Instructions Will Be Listed Below (If Applicable).     If you need a refill on your cardiac medications before your next appointment, please call your pharmacy.   

## 2018-01-09 NOTE — Progress Notes (Signed)
Cardiology Office Note:    Date:  01/09/2018   ID:  Heather Mosley, DOB [birth year], MRN [id number]  PCP:  Mateo Flow, MD  Cardiologist:  Jenne Campus, MD    Referring MD: Mateo Flow, MD   Chief Complaint  Patient presents with  . Follow-up  Well  History of Present Illness:    Heather Mosley is a 52 y.o. female with status post aortic root and aortic valve replacement doing well denies have any chest pain tightness squeezing pressure burning chest except for some soreness on the right side.  She finished rehab doing well still trying to be active by walking and have no difficulty doing it.  Overall described to have more energy being able to do more than before.  Past Medical History:  Diagnosis Date  . AICD (automatic cardioverter/defibrillator) present 2015  . Aortic valvular stenoses    "born w/it"  . Arthritis    "knees" (06/07/2017)  . Congenital heart defect   . Heart murmur   . High cholesterol   . Hypertension   . OSA on CPAP   . Pulmonary embolism ([place]) 2015    Past Surgical History:  Procedure Laterality Date  . ANOMALOUS PULMONARY VENOUS RETURN REPAIR, TOTAL  2015  . AORTIC VALVE REPAIR  1985  . AORTIC VALVE REPLACEMENT  2006   aortic valve/aortic root replacement with porcine valve, and extending aorta and hemi-arch replacement /notes 06/07/2017  . AORTIC VALVE REPLACEMENT    . [place]; 2006; 2015   "prior to my ORs those years"  . CARDIAC DEFIBRILLATOR PLACEMENT  2015  . CARDIAC VALVE REPLACEMENT    . CESAREAN SECTION  2008  . LEFT HEART CATH AND CORONARY ANGIOGRAPHY N/A 06/08/2017   Procedure: Left Heart Cath and Coronary Angiography;  Surgeon: Belva Crome, MD;  Location: [place] [facility];  Service: Cardiovascular;  Laterality: N/A;    Current Medications: Current Meds  Medication Sig  . acetaminophen (TYLENOL) 325 MG tablet Take 650 mg by mouth 2 (two) times daily as needed for moderate pain or headache.   Marland Kitchen aspirin  EC 81 MG tablet Take 81 mg by mouth daily.  Marland Kitchen atorvastatin (LIPITOR) 80 MG tablet Take 80 mg by mouth daily.   . furosemide (LASIX) 20 MG tablet Take 2 tablets (40 mg total) by mouth daily.  . Glucosamine 500 MG CAPS Take 2 capsules by mouth daily.  . mometasone (NASONEX) 50 MCG/ACT nasal spray Place 2 sprays into the nose daily as needed (allergies).   . Multiple Vitamin (MULTIVITAMIN WITH MINERALS) TABS tablet Take 1 tablet by mouth at bedtime.  . nitroGLYCERIN (NITROSTAT) 0.4 MG SL tablet Place 1 tablet (0.4 mg total) under the tongue every 5 (five) minutes x 3 doses as needed for chest pain.  . potassium chloride (K-DUR) 10 MEQ tablet Take 1 tablet (10 mEq total) by mouth daily.  Marland Kitchen warfarin (COUMADIN) 5 MG tablet TAKE 2 TO 2 AND 1/2 TABLETS BY MOUTH DAILY AS DIRECTED BY COUMADIN CLINIC.     Allergies:   Tape   Social History   Socioeconomic History  . Marital status: Married    Spouse name: None  . Number of children: None  . Years of education: None  . Highest education level: None  Social Needs  . Financial resource strain: None  . Food insecurity - worry: None  . Food insecurity - inability: None  . Transportation needs - medical: None  . Transportation needs -  non-medical: None  Occupational History  . None  Tobacco Use  . Smoking status: Never Smoker  . Smokeless tobacco: Never Used  Substance and Sexual Activity  . Alcohol use: No  . Drug use: No  . Sexual activity: Yes  Other Topics Concern  . None  Social History Narrative  . None     Family History: The patient's family history includes Hyperlipidemia in her father; Hypertension in her mother; Leukemia in her father. ROS:   Please see the history of present illness.    All 14 point review of systems negative except as described per history of present illness  EKGs/Labs/Other Studies Reviewed:      Recent Labs: 06/08/2017: Hemoglobin 11.0; Platelets 179 06/09/2017: B Natriuretic Peptide  183.3 09/07/2017: NT-Pro BNP 386 09/19/2017: BUN 13; Creatinine, Ser 0.67; Potassium 4.4; Sodium 140  Recent Lipid Panel    Component Value Date/Time   CHOL 176 11/01/2017 0921   TRIG 245 (H) 11/01/2017 0921   HDL 46 11/01/2017 0921   CHOLHDL 3.8 11/01/2017 0921   LDLCALC 81 11/01/2017 0921    Physical Exam:    VS:  BP 110/60   Pulse 95   Ht 5\' 1"  (1.549 m)   Wt (!) 318 lb (144.2 kg)   SpO2 98%   BMI 60.09 kg/m     Wt Readings from Last 3 Encounters:  01/09/18 (!) 318 lb (144.2 kg)  10/11/17 (!) 309 lb (140.2 kg)  09/07/17 (!) 301 lb 1.9 oz (136.6 kg)     GEN:  Well nourished, well developed in no acute distress HEENT: Normal NECK: No JVD; No carotid bruits LYMPHATICS: No lymphadenopathy CARDIAC: RRR, mechanical valve sounds are crisp.  There is soft systolic murmur grade 1/6 best heard at the right upper portion of the sternum, no rubs, no gallops RESPIRATORY:  Clear to auscultation without rales, wheezing or rhonchi  ABDOMEN: Soft, non-tender, non-distended MUSCULOSKELETAL:  No edema; No deformity  SKIN: Warm and dry LOWER EXTREMITIES: no swelling NEUROLOGIC:  Alert and oriented x 3 PSYCHIATRIC:  Normal affect   ASSESSMENT:    1. Aortic valve replaced   2. S/P ICD (internal cardiac defibrillator) procedure   3. 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 Morbid obesity ([place])   5. History of pulmonary embolism    PLAN:    In order of problems listed above:  1. Aortic valve replacement: Doing well continue present management. 2. ICD: Follow-up by group from [facility] however she said when we will be back in [place] she would like [place] to take over that care. 3. Has post aortic root replacement with clinical aortic valve: Doing well from that point of view we will continue present management 4. Morbid obesity: Always a problem she understands she needs to lose weight to try to work on that 5. Remote  history of pulmonary emboli: Stable on anticoagulation which I will continue   Medication Adjustments/Labs and Tests Ordered: Current medicines are reviewed at length with the patient today.  Concerns regarding medicines are outlined above.  No orders of the defined types were placed in this encounter.  Medication changes: No orders of the defined types were placed in this encounter.   Signed, Park Liter, MD, [facility] 01/09/2018 10:09 AM    [place]

## 2018-02-01 DIAGNOSIS — J40 Bronchitis, not specified as acute or chronic: Secondary | ICD-10-CM | POA: Diagnosis not present

## 2018-02-01 DIAGNOSIS — J329 Chronic sinusitis, unspecified: Secondary | ICD-10-CM | POA: Diagnosis not present

## 2018-02-12 ENCOUNTER — Other Ambulatory Visit: Payer: Self-pay | Admitting: Cardiology

## 2018-02-20 ENCOUNTER — Ambulatory Visit (INDEPENDENT_AMBULATORY_CARE_PROVIDER_SITE_OTHER): Payer: 59

## 2018-02-20 DIAGNOSIS — Z5181 Encounter for therapeutic drug level monitoring: Secondary | ICD-10-CM | POA: Diagnosis not present

## 2018-02-20 DIAGNOSIS — Z86711 Personal history of pulmonary embolism: Secondary | ICD-10-CM

## 2018-02-20 DIAGNOSIS — Z952 Presence of prosthetic heart valve: Secondary | ICD-10-CM

## 2018-02-20 LAB — POCT INR: INR: 2.3

## 2018-02-20 NOTE — Patient Instructions (Signed)
Description   Continue taking 2 tablets daily except 2.5 tablets on Mondays, Wednesdays and Fridays. Recheck INR in 6 weeks. Please be sure to keep your intake of potassium rich vegetables consistent on a weekly basis. Please call the office with any medication changes or additions [phone].

## 2018-03-18 ENCOUNTER — Other Ambulatory Visit: Payer: Self-pay

## 2018-03-18 MED ORDER — ATORVASTATIN CALCIUM 80 MG PO TABS: 80.0000 mg | ORAL_TABLET | Freq: Every day | ORAL | 1 refills | Status: DC

## 2018-03-27 ENCOUNTER — Telehealth: Payer: Self-pay | Admitting: Cardiology

## 2018-03-27 NOTE — Telephone Encounter (Signed)
Patient has been doing device checks through [place], however, she wants to have that done through the [facility] system from now on. Advised patient that she would receive a phone call to schedule an appointment with our EP doctors who do the device checks. Patient verbalized understanding. No further questions. Sent staff message to Trinidad Curet, RN to schedule.

## 2018-03-27 NOTE — Telephone Encounter (Signed)
Wants to start getting her device checks here

## 2018-04-09 ENCOUNTER — Ambulatory Visit (INDEPENDENT_AMBULATORY_CARE_PROVIDER_SITE_OTHER): Payer: 59

## 2018-04-09 ENCOUNTER — Encounter: Payer: Self-pay | Admitting: Cardiology

## 2018-04-09 ENCOUNTER — Ambulatory Visit (INDEPENDENT_AMBULATORY_CARE_PROVIDER_SITE_OTHER): Payer: 59 | Admitting: Cardiology

## 2018-04-09 VITALS — BP 160/62 | HR 86 | Ht 61.0 in | Wt 323.0 lb

## 2018-04-09 DIAGNOSIS — Z95810 Presence of automatic (implantable) cardiac defibrillator: Secondary | ICD-10-CM | POA: Diagnosis not present

## 2018-04-09 DIAGNOSIS — Z5181 Encounter for therapeutic drug level monitoring: Secondary | ICD-10-CM | POA: Diagnosis not present

## 2018-04-09 DIAGNOSIS — Z86711 Personal history of pulmonary embolism: Secondary | ICD-10-CM

## 2018-04-09 DIAGNOSIS — I5023 Acute on chronic systolic (congestive) heart failure: Secondary | ICD-10-CM | POA: Diagnosis not present

## 2018-04-09 DIAGNOSIS — Z952 Presence of prosthetic heart valve: Secondary | ICD-10-CM | POA: Diagnosis not present

## 2018-04-09 LAB — POCT INR: INR: 2.6 (ref 2.0–3.0)

## 2018-04-09 NOTE — Progress Notes (Signed)
Cardiology Office Note:    Date:  04/09/2018   ID:  Heather Mosley, DOB 03-09-1966, MRN [id number]  PCP:  Mateo Flow, MD  Cardiologist:  Jenne Campus, MD    Referring MD: Mateo Flow, MD   Chief Complaint  Patient presents with  . Follow-up  Doing well  History of Present Illness:    Heather Mosley is a 52 y.o. female with very complex past medical history.  She did have aortic valve surgery with aortic root surgery and when she was relatively young after that she required redo surgery.  Then TAVR was done that fairly quickly failed and recently at [facility] which was about a year ago she got aortic valve replacement with aortic root repair.  She comes to our office for follow-up she is doing well actually last week she was at the beach and was able to do much more than previously.  She said intentionally she was trying to push herself to see how much she can do and she did quite well.  Does have some exertional shortness of breath as usual but I think the major contributing factor is her obesity.  Past Medical History:  Diagnosis Date  . AICD (automatic cardioverter/defibrillator) present 2015  . Aortic valvular stenoses    "born w/it"  . Arthritis    "knees" (06/07/2017)  . Congenital heart defect   . Heart murmur   . High cholesterol   . Hypertension   . OSA on CPAP   . Pulmonary embolism ([place]) 2015    Past Surgical History:  Procedure Laterality Date  . ANOMALOUS PULMONARY VENOUS RETURN REPAIR, TOTAL  2015  . AORTIC VALVE REPAIR  1985  . AORTIC VALVE REPLACEMENT  2006   aortic valve/aortic root replacement with porcine valve, and extending aorta and hemi-arch replacement /notes 06/07/2017  . AORTIC VALVE REPLACEMENT    . [place]; 2006; 2015   "prior to my ORs those years"  . CARDIAC DEFIBRILLATOR PLACEMENT  2015  . CARDIAC VALVE REPLACEMENT    . CESAREAN SECTION  2008  . LEFT HEART CATH AND CORONARY ANGIOGRAPHY N/A 06/08/2017   Procedure: Left  Heart Cath and Coronary Angiography;  Surgeon: Belva Crome, MD;  Location: [place] [facility];  Service: Cardiovascular;  Laterality: N/A;    Current Medications: Current Meds  Medication Sig  . acetaminophen (TYLENOL) 325 MG tablet Take 650 mg by mouth 2 (two) times daily as needed for moderate pain or headache.   Marland Kitchen aspirin EC 81 MG tablet Take 81 mg by mouth daily.  Marland Kitchen atorvastatin (LIPITOR) 80 MG tablet Take 1 tablet (80 mg total) by mouth daily.  . furosemide (LASIX) 20 MG tablet Take 2 tablets (40 mg total) by mouth daily. (Patient taking differently: Take 40 mg by mouth as needed. )  . Glucosamine 500 MG CAPS Take 2 capsules by mouth daily.  . mometasone (NASONEX) 50 MCG/ACT nasal spray Place 2 sprays into the nose daily as needed (allergies).   . Multiple Vitamin (MULTIVITAMIN WITH MINERALS) TABS tablet Take 1 tablet by mouth at bedtime.  . nitroGLYCERIN (NITROSTAT) 0.4 MG SL tablet Place 1 tablet (0.4 mg total) under the tongue every 5 (five) minutes x 3 doses as needed for chest pain.  . potassium chloride (K-DUR) 10 MEQ tablet Take 1 tablet (10 mEq total) by mouth daily. (Patient taking differently: Take 10 mEq by mouth as needed. )  . warfarin (COUMADIN) 5 MG tablet TAKE 2-2 &  1/2 TABLET BY MOUTH ONCE DAILY AS DIRECTED BY COUMADIN CLINIC.     Allergies:   Tape   Social History   Socioeconomic History  . Marital status: Married    Spouse name: Not on file  . Number of children: Not on file  . Years of education: Not on file  . Highest education level: Not on file  Occupational History  . Not on file  Social Needs  . Financial resource strain: Not on file  . Food insecurity:    Worry: Not on file    Inability: Not on file  . Transportation needs:    Medical: Not on file    Non-medical: Not on file  Tobacco Use  . Smoking status: Never Smoker  . Smokeless tobacco: Never Used  Substance and Sexual Activity  . Alcohol use: No  . Drug use: No  . Sexual activity: Yes   Lifestyle  . Physical activity:    Days per week: Not on file    Minutes per session: Not on file  . Stress: Not on file  Relationships  . Social connections:    Talks on phone: Not on file    Gets together: Not on file    Attends religious service: Not on file    Active member of club or organization: Not on file    Attends meetings of clubs or organizations: Not on file    Relationship status: Not on file  Other Topics Concern  . Not on file  Social History Narrative  . Not on file     Family History: The patient's family history includes Hyperlipidemia in her father; Hypertension in her mother; Leukemia in her father. ROS:   Please see the history of present illness.    All 14 point review of systems negative except as described per history of present illness  EKGs/Labs/Other Studies Reviewed:      Recent Labs: 06/08/2017: Hemoglobin 11.0; Platelets 179 06/09/2017: B Natriuretic Peptide 183.3 09/07/2017: NT-Pro BNP 386 09/19/2017: BUN 13; Creatinine, Ser 0.67; Potassium 4.4; Sodium 140  Recent Lipid Panel    Component Value Date/Time   CHOL 176 11/01/2017 0921   TRIG 245 (H) 11/01/2017 0921   HDL 46 11/01/2017 0921   CHOLHDL 3.8 11/01/2017 0921   LDLCALC 81 11/01/2017 0921    Physical Exam:    VS:  BP (!) 160/62   Pulse 86   Ht 5\' 1"  (1.549 m)   Wt (!) 323 lb (146.5 kg)   SpO2 97%   BMI 61.03 kg/m     Wt Readings from Last 3 Encounters:  04/09/18 (!) 323 lb (146.5 kg)  01/09/18 (!) 318 lb (144.2 kg)  10/11/17 (!) 309 lb (140.2 kg)     GEN:  Well nourished, well developed in no acute distress HEENT: Normal NECK: No JVD; No carotid bruits LYMPHATICS: No lymphadenopathy CARDIAC: RRR, no murmurs, no rubs, no gallops RESPIRATORY:  Clear to auscultation without rales, wheezing or rhonchi  ABDOMEN: Soft, non-tender, non-distended MUSCULOSKELETAL:  No edema; No deformity  SKIN: Warm and dry LOWER EXTREMITIES: no swelling NEUROLOGIC:  Alert and oriented  x 3 PSYCHIATRIC:  Normal affect   ASSESSMENT:    1. Aortic valve replaced   2. ICD (implantable cardioverter-defibrillator) in place   3. History of pulmonary embolism   4. Acute on chronic systolic ACC/AHA stage C congestive heart failure ([place])   5. Morbid obesity ([place])    PLAN:    In order of problems listed above:  1. Status post aortic valve replacement doing well anticoagulated which I will continue. 2. ICD present.  We waiting for response from our EP team to enroll her in our program. 3. History of pulmonary emboli.  Doing well from that point review on anticoagulation because of aortic valve we will continue 4. Chronic congestive heart failure.  Compensated doing well. 5. Morbid obesity she gained some weight since I seen her last time we initiated discussion about that she became tearful and crying.  She is been struggling with this problem for years.  I recommended for her to check new place and [place] we have that does have some instruction about how to eat and how to lose weight.   Medication Adjustments/Labs and Tests Ordered: Current medicines are reviewed at length with the patient today.  Concerns regarding medicines are outlined above.  No orders of the defined types were placed in this encounter.  Medication changes: No orders of the defined types were placed in this encounter.   Signed, Park Liter, MD, [facility] 04/09/2018 9:29 AM    [place]

## 2018-04-09 NOTE — Patient Instructions (Signed)
Description   Continue taking 2 tablets daily except 2.5 tablets on Mondays, Wednesdays and Fridays. Recheck INR in 6 weeks at the [place] office. Please be sure to keep your intake of potassium rich vegetables consistent on a weekly basis. Please call the office with any medication changes or additions [phone].      

## 2018-04-09 NOTE — Patient Instructions (Signed)
Medication Instructions:  Your physician recommends that you continue on your current medications as directed. Please refer to the Current Medication list given to you today.   Labwork: None  Testing/Procedures: None  Follow-Up: Your physician wants you to follow-up in: 5 months. You will receive a reminder letter in the mail two months in advance. If you don't receive a letter, please call our office to schedule the follow-up appointment.   You have been referred to our electrophysiology team. You will be contacted to schedule this appointment.    If you need a refill on your cardiac medications before your next appointment, please call your pharmacy.   Thank you for choosing [facility]! Robyne Peers, RN [phone]

## 2018-04-18 ENCOUNTER — Telehealth: Payer: Self-pay | Admitting: *Deleted

## 2018-04-18 MED ORDER — WARFARIN SODIUM 5 MG PO TABS: ORAL_TABLET | ORAL | 1 refills | Status: DC

## 2018-04-19 NOTE — Telephone Encounter (Signed)
Refill done as requested 

## 2018-04-23 DIAGNOSIS — L570 Actinic keratosis: Secondary | ICD-10-CM | POA: Diagnosis not present

## 2018-04-23 DIAGNOSIS — D2271 Melanocytic nevi of right lower limb, including hip: Secondary | ICD-10-CM | POA: Diagnosis not present

## 2018-04-23 DIAGNOSIS — L918 Other hypertrophic disorders of the skin: Secondary | ICD-10-CM | POA: Diagnosis not present

## 2018-04-23 DIAGNOSIS — D2272 Melanocytic nevi of left lower limb, including hip: Secondary | ICD-10-CM | POA: Diagnosis not present

## 2018-04-23 DIAGNOSIS — B079 Viral wart, unspecified: Secondary | ICD-10-CM | POA: Diagnosis not present

## 2018-05-10 DIAGNOSIS — G4733 Obstructive sleep apnea (adult) (pediatric): Secondary | ICD-10-CM | POA: Diagnosis not present

## 2018-05-20 ENCOUNTER — Ambulatory Visit (INDEPENDENT_AMBULATORY_CARE_PROVIDER_SITE_OTHER): Payer: 59

## 2018-05-20 DIAGNOSIS — Z86711 Personal history of pulmonary embolism: Secondary | ICD-10-CM

## 2018-05-20 DIAGNOSIS — Z952 Presence of prosthetic heart valve: Secondary | ICD-10-CM | POA: Diagnosis not present

## 2018-05-20 DIAGNOSIS — Z5181 Encounter for therapeutic drug level monitoring: Secondary | ICD-10-CM

## 2018-05-20 LAB — POCT INR: INR: 2.3 (ref 2.0–3.0)

## 2018-05-20 NOTE — Patient Instructions (Signed)
Description   Continue taking 2 tablets daily except 2.5 tablets on Mondays, Wednesdays and Fridays. Recheck INR in 6 weeks at the [place] office. Please be sure to keep your intake of potassium rich vegetables consistent on a weekly basis. Please call the office with any medication changes or additions [phone].

## 2018-05-21 ENCOUNTER — Encounter: Payer: Self-pay | Admitting: Cardiology

## 2018-05-22 DIAGNOSIS — I469 Cardiac arrest, cause unspecified: Secondary | ICD-10-CM | POA: Diagnosis not present

## 2018-05-23 DIAGNOSIS — I469 Cardiac arrest, cause unspecified: Secondary | ICD-10-CM | POA: Diagnosis not present

## 2018-05-23 DIAGNOSIS — Z4502 Encounter for adjustment and management of automatic implantable cardiac defibrillator: Secondary | ICD-10-CM | POA: Diagnosis not present

## 2018-06-05 ENCOUNTER — Encounter: Payer: Self-pay | Admitting: Cardiology

## 2018-06-05 ENCOUNTER — Ambulatory Visit (INDEPENDENT_AMBULATORY_CARE_PROVIDER_SITE_OTHER): Payer: 59 | Admitting: Cardiology

## 2018-06-05 VITALS — BP 144/82 | HR 85 | Ht 61.0 in | Wt 327.0 lb

## 2018-06-05 DIAGNOSIS — G4733 Obstructive sleep apnea (adult) (pediatric): Secondary | ICD-10-CM

## 2018-06-05 DIAGNOSIS — I472 Ventricular tachycardia, unspecified: Secondary | ICD-10-CM

## 2018-06-05 DIAGNOSIS — I469 Cardiac arrest, cause unspecified: Secondary | ICD-10-CM | POA: Diagnosis not present

## 2018-06-05 DIAGNOSIS — I10 Essential (primary) hypertension: Secondary | ICD-10-CM | POA: Diagnosis not present

## 2018-06-05 DIAGNOSIS — Z952 Presence of prosthetic heart valve: Secondary | ICD-10-CM

## 2018-06-05 NOTE — Patient Instructions (Addendum)
Medication Instructions:  Your physician recommends that you continue on your current medications as directed. Please refer to the Current Medication list given to you today.  *If you need a refill on your cardiac medications before your next appointment, please call your pharmacy*  Labwork: None ordered  Testing/Procedures: None ordered  Follow-Up: Remote monitoring is used to monitor your Pacemaker or ICD from home. This monitoring reduces the number of office visits required to check your device to one time per year. It allows [place] to keep an eye on the functioning of your device to ensure it is working properly. You are scheduled for a device check from home on 09/04/2018. You may send your transmission at any time that day. If you have a wireless device, the transmission will be sent automatically. After your physician reviews your transmission, you will receive a postcard with your next transmission date.  Your physician wants you to follow-up in: 1 year with Dr. Curt Bears.  You will receive a reminder letter in the mail two months in advance. If you don't receive a letter, please call our office to schedule the follow-up appointment.  Thank you for choosing [facility]!!   Trinidad Curet, RN [phone]

## 2018-06-05 NOTE — Progress Notes (Signed)
Electrophysiology Office Note   Date:  06/05/2018   ID:  DELPHA PERKO, DOB 1965/11/15, MRN [id number]  PCP:  Mateo Flow, MD  Cardiologist:  Agustin Cree Primary Electrophysiologist:  Lamoine Magallon Meredith Leeds, MD    Chief Complaint  Patient presents with  . Defib Check    Cardiac Arrest     History of Present Illness: Heather Mosley is a 52 y.o. female who is being seen today for the evaluation of ventricular tachycardia, ICD at the request of Heather Mosley. Presenting today for electrophysiology evaluation.  She has a history of aortic valve/aortic root replacement with porcine valve and extending aorta and hemi-arch replacement in 2006, hypertension, and obesity.  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  Shortly after she developed a PE and cardiac arrest secondary to VT.  She had an ICD implanted at [facility] and was placed on Xarelto.  She was on this for 1 year which has since been stopped.   Today, she denies symptoms of palpitations, chest pain, shortness of breath, orthopnea, PND, lower extremity edema, claudication, dizziness, presyncope, syncope, bleeding, or neurologic sequela. The patient is tolerating medications without difficulties.    Past Medical History:  Diagnosis Date  . AICD (automatic cardioverter/defibrillator) present 2015  . Aortic valvular stenoses    "born w/it"  . Arthritis    "knees" (06/07/2017)  . Congenital heart defect   . Heart murmur   . High cholesterol   . Hypertension   . OSA on CPAP   . Pulmonary embolism ([place]) 2015   Past Surgical History:  Procedure Laterality Date  . ANOMALOUS PULMONARY VENOUS RETURN REPAIR, TOTAL  2015  . AORTIC VALVE REPAIR  1985  . AORTIC VALVE REPLACEMENT  2006   aortic valve/aortic root  replacement with porcine valve, and extending aorta and hemi-arch replacement /notes 06/07/2017  . AORTIC VALVE REPLACEMENT    . [place]; 2006; 2015   "prior to my ORs those years"  . CARDIAC DEFIBRILLATOR PLACEMENT  2015  . CARDIAC VALVE REPLACEMENT    . CESAREAN SECTION  2008  . LEFT HEART CATH AND CORONARY ANGIOGRAPHY N/A 06/08/2017   Procedure: Left Heart Cath and Coronary Angiography;  Surgeon: Belva Crome, MD;  Location: [place] [facility];  Service: Cardiovascular;  Laterality: N/A;     Current Outpatient Medications  Medication Sig Dispense Refill  . acetaminophen (TYLENOL) 325 MG tablet Take 650 mg by mouth 2 (two) times daily as needed for moderate pain or headache.     Marland Kitchen aspirin EC 81 MG tablet Take 81 mg by mouth daily.    Marland Kitchen atorvastatin (LIPITOR) 80 MG tablet Take 1 tablet (80 mg total) by mouth daily. 90 tablet 1  . furosemide (LASIX) 20 MG tablet Take 2 tablets (40 mg total) by mouth daily. (Patient taking differently: Take 40 mg by mouth as needed. ) 60 tablet 11  . Glucosamine 500 MG CAPS Take 2 capsules by mouth daily.    . mometasone (NASONEX) 50 MCG/ACT nasal spray Place 2 sprays into the nose daily as needed (allergies).     . Multiple Vitamin (MULTIVITAMIN WITH MINERALS) TABS tablet Take 1 tablet by mouth at bedtime.    . nitroGLYCERIN (NITROSTAT) 0.4 MG SL tablet Place 1 tablet (0.4 mg total) under the tongue every  5 (five) minutes x 3 doses as needed for chest pain. 25 tablet 12  . potassium chloride (K-DUR) 10 MEQ tablet Take 1 tablet (10 mEq total) by mouth daily. (Patient taking differently: Take 10 mEq by mouth as needed. ) 120 tablet 11  . warfarin (COUMADIN) 5 MG tablet TAKE 2-2 & 1/2 TABLET BY MOUTH ONCE DAILY AS DIRECTED BY COUMADIN CLINIC. 70 tablet 1   No current facility-administered medications for this visit.     Allergies:   Tape   Social History:  The patient  reports that she has never smoked. She has never used smokeless  tobacco. She reports that she does not drink alcohol or use drugs.   Family History:  The patient's family history includes Hyperlipidemia in her father; Hypertension in her mother; Leukemia in her father.    ROS:  Please see the history of present illness.   Otherwise, review of systems is positive for none.   All other systems are reviewed and negative.    PHYSICAL EXAM: VS:  BP (!) 144/82   Pulse 85   Ht 5\' 1"  (1.549 m)   Wt (!) 327 lb (148.3 kg)   SpO2 96%   BMI 61.79 kg/m  , BMI Body mass index is 61.79 kg/m. GEN: Well nourished, well developed, in no acute distress  HEENT: normal  Neck: no JVD, carotid bruits, or masses Cardiac: RRR; no murmurs, rubs, or gallops,no edema  Respiratory:  clear to auscultation bilaterally, normal work of breathing GI: soft, nontender, nondistended, + BS MS: no deformity or atrophy  Skin: warm and dry, device pocket is well healed Neuro:  Strength and sensation are intact Psych: euthymic mood, full affect  EKG:  EKG is not ordered today. Personal review of the ekg ordered 10/11/18 shows sinus rhythm, septal Q waves  Device interrogation is reviewed today in detail.  See PaceArt for details.   Recent Labs: 06/08/2017: Hemoglobin 11.0; Platelets 179 06/09/2017: B Natriuretic Peptide 183.3 09/07/2017: NT-Pro BNP 386 09/19/2017: BUN 13; Creatinine, Ser 0.67; Potassium 4.4; Sodium 140    Lipid Panel     Component Value Date/Time   CHOL 176 11/01/2017 0921   TRIG 245 (H) 11/01/2017 0921   HDL 46 11/01/2017 0921   CHOLHDL 3.8 11/01/2017 0921   LDLCALC 81 11/01/2017 0921     Wt Readings from Last 3 Encounters:  06/05/18 (!) 327 lb (148.3 kg)  04/09/18 (!) 323 lb (146.5 kg)  01/09/18 (!) 318 lb (144.2 kg)      Other studies Reviewed: Additional studies/ records that were reviewed today include: TTE 11/01/18  Review of the above records today demonstrates:  - Left ventricle: The cavity size was normal. There was moderate    concentric hypertrophy. Systolic function was normal. The   estimated ejection fraction was in the range of 55% to 65%. Wall   motion was normal; there were no regional wall motion   abnormalities. - Aortic valve: Valve area (VTI): 1.4 cm^2. Valve area (Vmax): 1.37   cm^2. Valve area (Vmean): 1.19 cm^2. - Left atrium: The atrium was mildly dilated.   ASSESSMENT AND PLAN:  1.  VT arrest: Status post [employer] ICD implanted in 2015.  Is functioning appropriately.  No changes.  2.  Aortic valve/aortic root replacement: Currently stable.  Being followed by Dr. Agustin Cree.  No changes.  3.  Hypertension: Pressure elevated today, but has been normal in the recent past.  Her blood pressure in February 7672 was systolic 094.  We  Lyell Clugston hold off on medication adjustments.  4.  Obesity: Weight loss encouraged.  5.  OSA: CPAP compliance encouraged  6.  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  She would prefer not to have further therapy as she is asymptomatic.  No changes.  Current medicines are reviewed at length with the patient today.   The patient does not have concerns regarding her medicines.  The following changes were made today:  none  Labs/ tests ordered today include:  No orders of the defined types were placed in this encounter.    Disposition:   FU with Heather Mosley 1 year  Signed, Levonte Molina Meredith Leeds, MD  06/05/2018 3:45 PM     [address] [place] [address] [phone] (office) [phone] (fax)

## 2018-06-19 ENCOUNTER — Other Ambulatory Visit: Payer: Self-pay

## 2018-06-19 MED ORDER — WARFARIN SODIUM 5 MG PO TABS: ORAL_TABLET | ORAL | 1 refills | Status: DC

## 2018-07-01 ENCOUNTER — Ambulatory Visit (INDEPENDENT_AMBULATORY_CARE_PROVIDER_SITE_OTHER): Payer: 59

## 2018-07-01 DIAGNOSIS — Z5181 Encounter for therapeutic drug level monitoring: Secondary | ICD-10-CM | POA: Diagnosis not present

## 2018-07-01 DIAGNOSIS — Z86711 Personal history of pulmonary embolism: Secondary | ICD-10-CM | POA: Diagnosis not present

## 2018-07-01 DIAGNOSIS — Z952 Presence of prosthetic heart valve: Secondary | ICD-10-CM | POA: Diagnosis not present

## 2018-07-01 LAB — POCT INR: INR: 2.7 (ref 2.0–3.0)

## 2018-07-01 NOTE — Patient Instructions (Signed)
Description   Continue taking 2 tablets daily except 2.5 tablets on Mondays, Wednesdays and Fridays. Recheck INR in 6 weeks at the [place] office. Please be sure to keep your intake of potassium rich vegetables consistent on a weekly basis. Please call the office with any medication changes or additions [phone].

## 2018-08-12 ENCOUNTER — Ambulatory Visit (INDEPENDENT_AMBULATORY_CARE_PROVIDER_SITE_OTHER): Payer: 59

## 2018-08-12 DIAGNOSIS — Z5181 Encounter for therapeutic drug level monitoring: Secondary | ICD-10-CM

## 2018-08-12 DIAGNOSIS — Z86711 Personal history of pulmonary embolism: Secondary | ICD-10-CM

## 2018-08-12 DIAGNOSIS — Z952 Presence of prosthetic heart valve: Secondary | ICD-10-CM

## 2018-08-12 LAB — POCT INR: INR: 2.2 (ref 2.0–3.0)

## 2018-08-12 NOTE — Patient Instructions (Signed)
Description   Continue taking 2 tablets daily except 2.5 tablets on Mondays, Wednesdays and Fridays. Recheck INR in 6 weeks at the [place] office. Please be sure to keep your intake of potassium rich vegetables consistent on a weekly basis. Please call the office with any medication changes or additions [phone].

## 2018-08-19 ENCOUNTER — Other Ambulatory Visit: Payer: Self-pay | Admitting: Cardiology

## 2018-08-19 NOTE — Telephone Encounter (Signed)
Wants warfrin called to [facility]

## 2018-09-04 ENCOUNTER — Ambulatory Visit (INDEPENDENT_AMBULATORY_CARE_PROVIDER_SITE_OTHER): Payer: 59 | Admitting: *Deleted

## 2018-09-04 DIAGNOSIS — I469 Cardiac arrest, cause unspecified: Secondary | ICD-10-CM

## 2018-09-04 DIAGNOSIS — I472 Ventricular tachycardia, unspecified: Secondary | ICD-10-CM

## 2018-09-04 NOTE — Progress Notes (Signed)
Remote ICD transmission.   

## 2018-09-06 DIAGNOSIS — Z23 Encounter for immunization: Secondary | ICD-10-CM | POA: Diagnosis not present

## 2018-09-17 ENCOUNTER — Other Ambulatory Visit: Payer: Self-pay | Admitting: Cardiology

## 2018-09-24 ENCOUNTER — Ambulatory Visit (INDEPENDENT_AMBULATORY_CARE_PROVIDER_SITE_OTHER): Payer: 59 | Admitting: Pharmacist

## 2018-09-24 DIAGNOSIS — Z5181 Encounter for therapeutic drug level monitoring: Secondary | ICD-10-CM

## 2018-09-24 DIAGNOSIS — Z952 Presence of prosthetic heart valve: Secondary | ICD-10-CM

## 2018-09-24 DIAGNOSIS — Z86711 Personal history of pulmonary embolism: Secondary | ICD-10-CM | POA: Diagnosis not present

## 2018-09-24 LAB — POCT INR: INR: 2.7 (ref 2.0–3.0)

## 2018-09-24 NOTE — Patient Instructions (Signed)
Description   Continue taking 2 tablets daily except 2.5 tablets on Mondays, Wednesdays and Fridays. Recheck INR in 7 weeks at the [place] office. Please be sure to keep your intake of potassium rich vegetables consistent on a weekly basis. Please call the office with any medication changes or additions [phone].

## 2018-10-24 ENCOUNTER — Ambulatory Visit (INDEPENDENT_AMBULATORY_CARE_PROVIDER_SITE_OTHER): Payer: 59 | Admitting: Cardiology

## 2018-10-24 ENCOUNTER — Encounter: Payer: Self-pay | Admitting: Cardiology

## 2018-10-24 VITALS — BP 120/62 | HR 75 | Ht 61.0 in | Wt 305.2 lb

## 2018-10-24 DIAGNOSIS — Z952 Presence of prosthetic heart valve: Secondary | ICD-10-CM

## 2018-10-24 DIAGNOSIS — R0609 Other forms of dyspnea: Secondary | ICD-10-CM

## 2018-10-24 DIAGNOSIS — Z95810 Presence of automatic (implantable) cardiac defibrillator: Secondary | ICD-10-CM

## 2018-10-24 DIAGNOSIS — Z954 Presence of other heart-valve replacement: Secondary | ICD-10-CM

## 2018-10-24 DIAGNOSIS — R0789 Other chest pain: Secondary | ICD-10-CM

## 2018-10-24 DIAGNOSIS — I350 Nonrheumatic aortic (valve) stenosis: Secondary | ICD-10-CM

## 2018-10-24 DIAGNOSIS — I351 Nonrheumatic aortic (valve) insufficiency: Secondary | ICD-10-CM

## 2018-10-24 DIAGNOSIS — R0600 Dyspnea, unspecified: Secondary | ICD-10-CM

## 2018-10-24 MED ORDER — NITROGLYCERIN 0.4 MG SL SUBL: 0.4000 mg | SUBLINGUAL_TABLET | SUBLINGUAL | 11 refills | Status: DC | PRN

## 2018-10-24 NOTE — Patient Instructions (Signed)
Medication Instructions:  Your physician recommends that you continue on your current medications as directed. Please refer to the Current Medication list given to you today.  If you need a refill on your cardiac medications before your next appointment, please call your pharmacy.   Lab work: Your physician recommends that you return for lab work in: BMP, lipids  If you have labs (blood work) drawn today and your tests are completely normal, you will receive your results only by: Marland Kitchen MyChart Message (if you have MyChart) OR . A paper copy in the mail If you have any lab test that is abnormal or we need to change your treatment, we will call you to review the results.  Testing/Procedures: Your physician has requested that you have an echocardiogram. Echocardiography is a painless test that uses sound waves to create images of your heart. It provides your doctor with information about the size and shape of your heart and how well your heart's chambers and valves are working. This procedure takes approximately one hour. There are no restrictions for this procedure.  Non-Cardiac CT Angiography (CTA), is a special type of CT scan that uses a computer to produce multi-dimensional views of major blood vessels throughout the body. In CT angiography, a contrast material is injected through an IV to help visualize the blood vessels     Follow-Up: At [facility], you and your health needs are our priority.  As part of our continuing mission to provide you with exceptional heart care, we have created designated Provider Care Teams.  These Care Teams include your primary Cardiologist (physician) and Advanced Practice Providers (APPs -  Physician Assistants and Nurse Practitioners) who all work together to provide you with the care you need, when you need it. You will need a follow up appointment in 6 months.  Please call our office 2 months in advance to schedule this appointment.  You may see Jenne Campus, MD or another member of our [place] Provider Team in [place]: Shirlee More, MD . Jyl Heinz, MD  Any Other Special Instructions Will Be Listed Below (If Applicable).  Echocardiogram An echocardiogram, or echocardiography, uses sound waves (ultrasound) to produce an image of your heart. The echocardiogram is simple, painless, obtained within a short period of time, and offers valuable information to your health care provider. The images from an echocardiogram can provide information such as:  Evidence of coronary artery disease (CAD).  Heart size.  Heart muscle function.  Heart valve function.  Aneurysm detection.  Evidence of a past heart attack.  Fluid buildup around the heart.  Heart muscle thickening.  Assess heart valve function.  Tell a health care provider about:  Any allergies you have.  All medicines you are taking, including vitamins, herbs, eye drops, creams, and over-the-counter medicines.  Any problems you or family members have had with anesthetic medicines.  Any blood disorders you have.  Any surgeries you have had.  Any medical conditions you have.  Whether you are pregnant or may be pregnant. What happens before the procedure? No special preparation is needed. Eat and drink normally. What happens during the procedure?  In order to produce an image of your heart, gel will be applied to your chest and a wand-like tool (transducer) will be moved over your chest. The gel will help transmit the sound waves from the transducer. The sound waves will harmlessly bounce off your heart to allow the heart images to be captured in real-time motion. These images will  then be recorded.  You may need an IV to receive a medicine that improves the quality of the pictures. What happens after the procedure? You may return to your normal schedule including diet, activities, and medicines, unless your health care provider tells you otherwise. This  information is not intended to replace advice given to you by your health care provider. Make sure you discuss any questions you have with your health care provider. Document Released: 10/27/2000 Document Revised: 06/17/2016 Document Reviewed: 07/07/2013 Elsevier Interactive Patient Education  2017 [employer].

## 2018-10-24 NOTE — Progress Notes (Signed)
Cardiology Office Note:    Date:  10/24/2018   ID:  Heather Mosley, DOB 1966/06/23, MRN [id number]  PCP:  Mateo Flow, MD  Cardiologist:  Jenne Campus, MD    Referring MD: Mateo Flow, MD   Chief Complaint  Patient presents with  . Follow-up  Doing well  History of Present Illness:    Heather Mosley is a 52 y.o. female with complex past medical history which includes multiple aortic valve replacement with latest mechanical valve prosthesis.  Done at [facility] also status post aortic root replacement.  Overall doing well she is working on weight loss and she is on weight watchers lost about 30 pounds feeling much better just couple days ago she was seen Christmas parade in town and she was able to walk long distance with no difficulty this is something new that she was not able to do before she is very proud of herself and she should be.  Denies have any chest pain tightness squeezing pressure in front chest.  Past Medical History:  Diagnosis Date  . AICD (automatic cardioverter/defibrillator) present 2015  . Aortic valvular stenoses    "born w/it"  . Arthritis    "knees" (06/07/2017)  . Congenital heart defect   . Heart murmur   . High cholesterol   . Hypertension   . OSA on CPAP   . Pulmonary embolism ([place]) 2015    Past Surgical History:  Procedure Laterality Date  . ANOMALOUS PULMONARY VENOUS RETURN REPAIR, TOTAL  2015  . AORTIC VALVE REPAIR  1985  . AORTIC VALVE REPLACEMENT  2006   aortic valve/aortic root replacement with porcine valve, and extending aorta and hemi-arch replacement /notes 06/07/2017  . AORTIC VALVE REPLACEMENT    . [place]; 2006; 2015   "prior to my ORs those years"  . CARDIAC DEFIBRILLATOR PLACEMENT  2015  . CARDIAC VALVE REPLACEMENT    . CESAREAN SECTION  2008  . LEFT HEART CATH AND CORONARY ANGIOGRAPHY N/A 06/08/2017   Procedure: Left Heart Cath and Coronary Angiography;  Surgeon: Belva Crome, MD;  Location: [place]  [facility];  Service: Cardiovascular;  Laterality: N/A;    Current Medications: Current Meds  Medication Sig  . acetaminophen (TYLENOL) 325 MG tablet Take 650 mg by mouth 2 (two) times daily as needed for moderate pain or headache.   Marland Kitchen aspirin EC 81 MG tablet Take 81 mg by mouth daily.  Marland Kitchen atorvastatin (LIPITOR) 80 MG tablet TAKE 1 TABLET BY MOUTH DAILY  . furosemide (LASIX) 20 MG tablet Take 2 tablets (40 mg total) by mouth daily. (Patient taking differently: Take 40 mg by mouth as needed. )  . Glucosamine 500 MG CAPS Take 2 capsules by mouth daily.  . mometasone (NASONEX) 50 MCG/ACT nasal spray Place 2 sprays into the nose daily as needed (allergies).   . Multiple Vitamin (MULTIVITAMIN WITH MINERALS) TABS tablet Take 1 tablet by mouth at bedtime.  . nitroGLYCERIN (NITROSTAT) 0.4 MG SL tablet Place 1 tablet (0.4 mg total) under the tongue every 5 (five) minutes x 3 doses as needed for chest pain.  . potassium chloride (K-DUR) 10 MEQ tablet Take 1 tablet (10 mEq total) by mouth daily. (Patient taking differently: Take 10 mEq by mouth as needed. )  . warfarin (COUMADIN) 5 MG tablet TAKE AS DIRECTED BY COUMADIN CLINIC (THIS IS A 30 DAYS SUPPLY)     Allergies:   Tape   Social History   Socioeconomic History  .  Marital status: Married    Spouse name: Not on file  . Number of children: Not on file  . Years of education: Not on file  . Highest education level: Not on file  Occupational History  . Not on file  Social Needs  . Financial resource strain: Not on file  . Food insecurity:    Worry: Not on file    Inability: Not on file  . Transportation needs:    Medical: Not on file    Non-medical: Not on file  Tobacco Use  . Smoking status: Never Smoker  . Smokeless tobacco: Never Used  Substance and Sexual Activity  . Alcohol use: No  . Drug use: No  . Sexual activity: Yes  Lifestyle  . Physical activity:    Days per week: Not on file    Minutes per session: Not on file  . Stress:  Not on file  Relationships  . Social connections:    Talks on phone: Not on file    Gets together: Not on file    Attends religious service: Not on file    Active member of club or organization: Not on file    Attends meetings of clubs or organizations: Not on file    Relationship status: Not on file  Other Topics Concern  . Not on file  Social History Narrative  . Not on file     Family History: The patient's family history includes Hyperlipidemia in her father; Hypertension in her mother; Leukemia in her father. ROS:   Please see the history of present illness.    All 14 point review of systems negative except as described per history of present illness  EKGs/Labs/Other Studies Reviewed:      Recent Labs: No results found for requested labs within last 8760 hours.  Recent Lipid Panel    Component Value Date/Time   CHOL 176 11/01/2017 0921   TRIG 245 (H) 11/01/2017 0921   HDL 46 11/01/2017 0921   CHOLHDL 3.8 11/01/2017 0921   LDLCALC 81 11/01/2017 0921    Physical Exam:    VS:  BP 120/62   Pulse 75   Ht 5\' 1"  (1.549 m)   Wt (!) 305 lb 3.2 oz (138.4 kg)   SpO2 98%   BMI 57.67 kg/m     Wt Readings from Last 3 Encounters:  10/24/18 (!) 305 lb 3.2 oz (138.4 kg)  06/05/18 (!) 327 lb (148.3 kg)  04/09/18 (!) 323 lb (146.5 kg)     GEN:  Well nourished, well developed in no acute distress HEENT: Normal NECK: No JVD; No carotid bruits LYMPHATICS: No lymphadenopathy CARDIAC: RRR, mechanical valve sounds are crisp.  Soft systolic murmur grade 1/6 to 2/6 best heard right upper portion of the sternum, no rubs, no gallops RESPIRATORY:  Clear to auscultation without rales, wheezing or rhonchi  ABDOMEN: Soft, non-tender, non-distended MUSCULOSKELETAL:  No edema; No deformity  SKIN: Warm and dry LOWER EXTREMITIES: no swelling NEUROLOGIC:  Alert and oriented x 3 PSYCHIATRIC:  Normal affect   ASSESSMENT:    1. Aortic valve replaced   2. Other chest pain   3.  Dyspnea on exertion   4. 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 S/P ICD (internal cardiac defibrillator) procedure    PLAN:    In order of problems listed above:  1. Status post aortic valve replacement will get echocardiogram to assess the valve. 2. Chest pain denies having any.  3. Dyspnea on exertion still present but improved. 4. Status post aortic root replacement.  It is time to repeat CT of her chest.  We will do that 5. ICD present followed by our EP team.   Medication Adjustments/Labs and Tests Ordered: Current medicines are reviewed at length with the patient today.  Concerns regarding medicines are outlined above.  No orders of the defined types were placed in this encounter.  Medication changes: No orders of the defined types were placed in this encounter.   Signed, Park Liter, MD, [facility] 10/24/2018 9:47 AM    [place]

## 2018-11-09 LAB — CUP PACEART REMOTE DEVICE CHECK
Date Time Interrogation Session: 20191228180705
Implantable Lead Implant Date: 20151228
Implantable Lead Location: 753860
Implantable Lead Serial Number: 358765
Implantable Pulse Generator Implant Date: 20151228
MDC IDC LEAD IMPLANT DT: 20151228
MDC IDC LEAD LOCATION: 753859
MDC IDC PG SERIAL: 508420

## 2018-11-14 ENCOUNTER — Ambulatory Visit (INDEPENDENT_AMBULATORY_CARE_PROVIDER_SITE_OTHER): Payer: 59 | Admitting: Pharmacist

## 2018-11-14 DIAGNOSIS — Z5181 Encounter for therapeutic drug level monitoring: Secondary | ICD-10-CM | POA: Diagnosis not present

## 2018-11-14 DIAGNOSIS — Z86711 Personal history of pulmonary embolism: Secondary | ICD-10-CM | POA: Diagnosis not present

## 2018-11-14 DIAGNOSIS — Z952 Presence of prosthetic heart valve: Secondary | ICD-10-CM

## 2018-11-14 LAB — POCT INR: INR: 2.5 (ref 2.0–3.0)

## 2018-11-14 NOTE — Patient Instructions (Signed)
Description   Continue taking 2 tablets daily except 2.5 tablets on Mondays, Wednesdays and Fridays. Recheck INR in 6 weeks at the [place] office. Please call the office with any medication changes or additions [phone].

## 2018-11-25 ENCOUNTER — Other Ambulatory Visit: Payer: Self-pay | Admitting: Cardiology

## 2018-11-25 NOTE — Telephone Encounter (Signed)
Please refill. Thanks!

## 2018-11-29 ENCOUNTER — Ambulatory Visit (INDEPENDENT_AMBULATORY_CARE_PROVIDER_SITE_OTHER)
Admission: RE | Admit: 2018-11-29 | Discharge: 2018-11-29 | Disposition: A | Discharge status: Home / Self Care | Payer: 59 | Source: Ambulatory Visit | Attending: Cardiology | Admitting: Cardiology

## 2018-11-29 DIAGNOSIS — Z952 Presence of prosthetic heart valve: Secondary | ICD-10-CM | POA: Diagnosis not present

## 2018-11-29 DIAGNOSIS — R918 Other nonspecific abnormal finding of lung field: Secondary | ICD-10-CM | POA: Diagnosis not present

## 2018-11-29 DIAGNOSIS — I351 Nonrheumatic aortic (valve) insufficiency: Secondary | ICD-10-CM

## 2018-11-29 DIAGNOSIS — I350 Nonrheumatic aortic (valve) stenosis: Secondary | ICD-10-CM

## 2018-11-29 MED ORDER — IOPAMIDOL (ISOVUE-370) INJECTION 76%
100.0000 mL | Freq: Once | INTRAVENOUS | Status: AC | PRN
  Administered 2018-11-29: 100 mL via INTRAVENOUS

## 2018-12-04 ENCOUNTER — Ambulatory Visit (INDEPENDENT_AMBULATORY_CARE_PROVIDER_SITE_OTHER): Payer: 59

## 2018-12-04 ENCOUNTER — Other Ambulatory Visit: Payer: Self-pay | Admitting: Cardiology

## 2018-12-04 DIAGNOSIS — I469 Cardiac arrest, cause unspecified: Secondary | ICD-10-CM | POA: Diagnosis not present

## 2018-12-05 LAB — CUP PACEART REMOTE DEVICE CHECK
Battery Remaining Percentage: 100 %
Date Time Interrogation Session: 20200123022600
HighPow Impedance: 80 Ohm
Implantable Lead Implant Date: 20151228
Implantable Lead Implant Date: 20151228
Implantable Lead Location: 753859
Implantable Lead Model: 292
Implantable Lead Serial Number: 358765
Implantable Pulse Generator Implant Date: 20151228
Lead Channel Impedance Value: 527 Ohm
Lead Channel Pacing Threshold Amplitude: 0.8 V
Lead Channel Pacing Threshold Pulse Width: 0.4 ms
Lead Channel Setting Pacing Amplitude: 2.4 V
Lead Channel Setting Pacing Pulse Width: 0.4 ms
Lead Channel Setting Sensing Sensitivity: 0.5 mV
MDC IDC LEAD LOCATION: 753860
MDC IDC MSMT BATTERY REMAINING LONGEVITY: 120 mo
MDC IDC MSMT LEADCHNL RA IMPEDANCE VALUE: 560 Ohm
MDC IDC MSMT LEADCHNL RA PACING THRESHOLD PULSEWIDTH: 0.4 ms
MDC IDC MSMT LEADCHNL RV PACING THRESHOLD AMPLITUDE: 1 V
MDC IDC SET LEADCHNL RA PACING AMPLITUDE: 2 V
MDC IDC STAT BRADY RA PERCENT PACED: 26 %
MDC IDC STAT BRADY RV PERCENT PACED: 2 %
Pulse Gen Serial Number: 508420

## 2018-12-05 NOTE — Progress Notes (Signed)
Remote ICD transmission.   

## 2018-12-06 ENCOUNTER — Encounter: Payer: Self-pay | Admitting: Cardiology

## 2018-12-10 ENCOUNTER — Ambulatory Visit (INDEPENDENT_AMBULATORY_CARE_PROVIDER_SITE_OTHER): Payer: 59

## 2018-12-10 DIAGNOSIS — R0609 Other forms of dyspnea: Secondary | ICD-10-CM | POA: Diagnosis not present

## 2018-12-10 DIAGNOSIS — I351 Nonrheumatic aortic (valve) insufficiency: Secondary | ICD-10-CM | POA: Diagnosis not present

## 2018-12-10 DIAGNOSIS — I350 Nonrheumatic aortic (valve) stenosis: Secondary | ICD-10-CM | POA: Diagnosis not present

## 2018-12-10 NOTE — Progress Notes (Signed)
Complete echocardiogram has been performed.  Jimmy Jamoni Hewes RDCS, RVT 

## 2018-12-31 ENCOUNTER — Other Ambulatory Visit: Payer: Self-pay | Admitting: Cardiology

## 2018-12-31 ENCOUNTER — Ambulatory Visit (INDEPENDENT_AMBULATORY_CARE_PROVIDER_SITE_OTHER): Payer: 59 | Admitting: *Deleted

## 2018-12-31 DIAGNOSIS — Z5181 Encounter for therapeutic drug level monitoring: Secondary | ICD-10-CM

## 2018-12-31 DIAGNOSIS — Z86711 Personal history of pulmonary embolism: Secondary | ICD-10-CM | POA: Diagnosis not present

## 2018-12-31 DIAGNOSIS — R0609 Other forms of dyspnea: Secondary | ICD-10-CM | POA: Diagnosis not present

## 2018-12-31 DIAGNOSIS — Z954 Presence of other heart-valve replacement: Secondary | ICD-10-CM | POA: Diagnosis not present

## 2018-12-31 DIAGNOSIS — Z952 Presence of prosthetic heart valve: Secondary | ICD-10-CM | POA: Diagnosis not present

## 2018-12-31 DIAGNOSIS — R0789 Other chest pain: Secondary | ICD-10-CM | POA: Diagnosis not present

## 2018-12-31 LAB — POCT INR: INR: 2.2 (ref 2.0–3.0)

## 2018-12-31 NOTE — Patient Instructions (Signed)
Description   Continue taking 2 tablets daily except 2.5 tablets on Mondays, Wednesdays and Fridays. Recheck INR in 6 weeks at the [place] office. Please call the office with any medication changes or additions [phone].

## 2019-01-01 LAB — BASIC METABOLIC PANEL
BUN/Creatinine Ratio: 17 (ref 9–23)
BUN: 12 mg/dL (ref 6–24)
CALCIUM: 9.1 mg/dL (ref 8.7–10.2)
CHLORIDE: 105 mmol/L (ref 96–106)
CO2: 22 mmol/L (ref 20–29)
Creatinine, Ser: 0.7 mg/dL (ref 0.57–1.00)
GFR calc Af Amer: 117 mL/min/{1.73_m2} (ref >59)
GFR calc non Af Amer: 101 mL/min/{1.73_m2} (ref >59)
GLUCOSE: 103 mg/dL — AB (ref 65–99)
POTASSIUM: 4.4 mmol/L (ref 3.5–5.2)
Sodium: 143 mmol/L (ref 134–144)

## 2019-01-01 LAB — LIPID PANEL
CHOLESTEROL TOTAL: 155 mg/dL (ref 100–199)
Chol/HDL Ratio: 3.4 ratio (ref 0.0–4.4)
HDL: 45 mg/dL (ref >39)
LDL Calculated: 73 mg/dL (ref 0–99)
TRIGLYCERIDES: 184 mg/dL — AB (ref 0–149)
VLDL CHOLESTEROL CAL: 37 mg/dL (ref 5–40)

## 2019-02-10 ENCOUNTER — Telehealth: Payer: Self-pay

## 2019-02-10 NOTE — Telephone Encounter (Signed)

## 2019-02-11 ENCOUNTER — Ambulatory Visit (INDEPENDENT_AMBULATORY_CARE_PROVIDER_SITE_OTHER): Payer: 59 | Admitting: *Deleted

## 2019-02-11 ENCOUNTER — Other Ambulatory Visit: Payer: Self-pay

## 2019-02-11 DIAGNOSIS — Z5181 Encounter for therapeutic drug level monitoring: Secondary | ICD-10-CM | POA: Diagnosis not present

## 2019-02-11 DIAGNOSIS — Z86711 Personal history of pulmonary embolism: Secondary | ICD-10-CM | POA: Diagnosis not present

## 2019-02-11 DIAGNOSIS — Z952 Presence of prosthetic heart valve: Secondary | ICD-10-CM

## 2019-02-11 LAB — POCT INR: INR: 2.3 (ref 2.0–3.0)

## 2019-02-25 ENCOUNTER — Other Ambulatory Visit: Payer: Self-pay | Admitting: Cardiology

## 2019-03-05 ENCOUNTER — Other Ambulatory Visit: Payer: Self-pay

## 2019-03-05 ENCOUNTER — Ambulatory Visit (INDEPENDENT_AMBULATORY_CARE_PROVIDER_SITE_OTHER): Payer: 59 | Admitting: *Deleted

## 2019-03-05 DIAGNOSIS — I469 Cardiac arrest, cause unspecified: Secondary | ICD-10-CM

## 2019-03-06 LAB — CUP PACEART REMOTE DEVICE CHECK
Battery Remaining Longevity: 126 mo
Battery Remaining Percentage: 100 %
Brady Statistic RA Percent Paced: 31 %
Brady Statistic RV Percent Paced: 3 %
Date Time Interrogation Session: 20200422193500
HighPow Impedance: 79 Ohm
Implantable Lead Implant Date: 20151228
Implantable Lead Implant Date: 20151228
Implantable Lead Location: 753859
Implantable Lead Location: 753860
Implantable Lead Model: 292
Implantable Lead Model: 5076
Implantable Lead Serial Number: 358765
Implantable Pulse Generator Implant Date: 20151228
Lead Channel Impedance Value: 512 Ohm
Lead Channel Impedance Value: 557 Ohm
Lead Channel Pacing Threshold Amplitude: 0.8 V
Lead Channel Pacing Threshold Amplitude: 1 V
Lead Channel Pacing Threshold Pulse Width: 0.4 ms
Lead Channel Pacing Threshold Pulse Width: 0.4 ms
Lead Channel Setting Pacing Amplitude: 2 V
Lead Channel Setting Pacing Amplitude: 2.4 V
Lead Channel Setting Pacing Pulse Width: 0.4 ms
Lead Channel Setting Sensing Sensitivity: 0.5 mV
Pulse Gen Serial Number: 508420

## 2019-03-13 ENCOUNTER — Encounter: Payer: Self-pay | Admitting: Cardiology

## 2019-03-13 NOTE — Progress Notes (Signed)
Remote ICD transmission.   

## 2019-03-17 ENCOUNTER — Other Ambulatory Visit: Payer: Self-pay | Admitting: Cardiology

## 2019-03-17 NOTE — Telephone Encounter (Signed)
Rx refill sent to pharmacy. 

## 2019-03-24 ENCOUNTER — Telehealth: Payer: Self-pay

## 2019-03-24 NOTE — Telephone Encounter (Signed)
lmom for prescreen  

## 2019-03-24 NOTE — Telephone Encounter (Signed)

## 2019-03-25 ENCOUNTER — Other Ambulatory Visit: Payer: Self-pay

## 2019-03-25 ENCOUNTER — Ambulatory Visit (INDEPENDENT_AMBULATORY_CARE_PROVIDER_SITE_OTHER): Payer: 59 | Admitting: *Deleted

## 2019-03-25 DIAGNOSIS — Z86711 Personal history of pulmonary embolism: Secondary | ICD-10-CM | POA: Diagnosis not present

## 2019-03-25 DIAGNOSIS — Z5181 Encounter for therapeutic drug level monitoring: Secondary | ICD-10-CM

## 2019-03-25 DIAGNOSIS — Z952 Presence of prosthetic heart valve: Secondary | ICD-10-CM

## 2019-03-25 LAB — POCT INR: INR: 1.6 — AB (ref 2.0–3.0)

## 2019-04-08 ENCOUNTER — Telehealth: Payer: Self-pay | Admitting: Student

## 2019-04-08 DIAGNOSIS — C44519 Basal cell carcinoma of skin of other part of trunk: Secondary | ICD-10-CM | POA: Diagnosis not present

## 2019-04-08 NOTE — Telephone Encounter (Signed)
Pt returning your call

## 2019-04-08 NOTE — Telephone Encounter (Signed)
lmtcb to arrange tele-health visit with Dr. Curt Bears

## 2019-04-08 NOTE — Telephone Encounter (Signed)
Called pt to discuss Alerts from 04/05/2019 and 03/20/2019.  Pt had intermittent episodes of what appears to be Atrial flutter with V rates in 150s on 04/05/19. Pt was more fatigued with palpitations that day. Atrial flutter is new diagnosis. Pt is on coumadin for h/o TAVR.    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 (See picture below)  Pt alerted no driving x 6 months. Will route to MD for recommendations.

## 2019-04-09 ENCOUNTER — Telehealth: Payer: Self-pay | Admitting: *Deleted

## 2019-04-09 ENCOUNTER — Telehealth: Payer: Self-pay | Admitting: Cardiology

## 2019-04-09 NOTE — Telephone Encounter (Signed)
Calling patient today to discuss upcoming appointment.  We are currently trying to limit exposure to the virus that causes COVID-19 by seeing patients at home rather than in the office. We would like to schedule this appointment as a Virtual Appointment VIA Smartphone or Laptop. Unable to reach patient.  LVMTCB  

## 2019-04-09 NOTE — Telephone Encounter (Signed)
Virtual Visit Pre-Appointment Phone Call  "(Name), I am calling you today to discuss your upcoming appointment. We are currently trying to limit exposure to the virus that causes COVID-19 by seeing patients at home rather than in the office."  1. "What is the BEST phone number to call the day of the visit?" - include this in appointment notes  2. Do you have or have access to (through a family member/friend) a smartphone with video capability that we can use for your visit?" a. If yes - list this number in appt notes as cell (if different from BEST phone #) and list the appointment type as a VIDEO visit in appointment notes b. If no - list the appointment type as a PHONE visit in appointment notes  3. Confirm consent - "In the setting of the current Covid19 crisis, you are scheduled for a (phone or video) visit with your provider on (date) at (time).  Just as we do with many in-office visits, in order for you to participate in this visit, we must obtain consent.  If you'd like, I can send this to your mychart (if signed up) or email for you to review.  Otherwise, I can obtain your verbal consent now.  All virtual visits are billed to your insurance company just like a normal visit would be.  By agreeing to a virtual visit, we'd like you to understand that the technology does not allow for your provider to perform an examination, and thus may limit your provider's ability to fully assess your condition. If your provider identifies any concerns that need to be evaluated in person, we will make arrangements to do so.  Finally, though the technology is pretty good, we cannot assure that it will always work on either your or our end, and in the setting of a video visit, we may have to convert it to a phone-only visit.  In either situation, we cannot ensure that we have a secure connection.  Are you willing to proceed?" STAFF: Did the patient verbally acknowledge consent to telehealth visit? Document  YES/NO here: YES  4. Advise patient to be prepared - "Two hours prior to your appointment, go ahead and check your blood pressure, pulse, oxygen saturation, and your weight (if you have the equipment to check those) and write them all down. When your visit starts, your provider will ask you for this information. If you have an Apple Watch or Kardia device, please plan to have heart rate information ready on the day of your appointment. Please have a pen and paper handy nearby the day of the visit as well."  5. Give patient instructions for MyChart download to smartphone OR Doximity/Doxy.me as below if video visit (depending on what platform provider is using)  6. Inform patient they will receive a phone call 15 minutes prior to their appointment time (may be from unknown caller ID) so they should be prepared to answer    Heather Mosley has been deemed a candidate for a follow-up tele-health visit to limit community exposure during the Covid-19 pandemic. I spoke with the patient via phone to ensure availability of phone/video source, confirm preferred email & phone number, and discuss instructions and expectations.  I reminded Heather Mosley to be prepared with any vital sign and/or heart rhythm information that could potentially be obtained via home monitoring, at the time of her visit. I reminded Heather Mosley to expect a phone call prior to  her visit.  Heather Mosley 04/09/2019 11:26 AM   INSTRUCTIONS FOR DOWNLOADING THE MYCHART APP TO SMARTPHONE  - The patient must first make sure to have activated MyChart and know their login information - If Apple, go to [employer] and type in MyChart in the search bar and download the app. If Android, ask patient to go to [employer] and type in [place] in the search bar and download the app. The app is free but as with any other app downloads, their phone may require them to verify saved payment information or Apple/Android  password.  - The patient will need to then log into the app with their MyChart username and password, and select [facility] as their healthcare provider to link the account. When it is time for your visit, go to the MyChart app, find appointments, and click Begin Video Visit. Be sure to Select Allow for your device to access the Microphone and Camera for your visit. You will then be connected, and your provider will be with you shortly.  **If they have any issues connecting, or need assistance please contact MyChart service desk [phone])**  **If using a computer, in order to ensure the best quality for their visit they will need to use either of the following Internet Browsers: [employer], or Google Chrome**  IF USING DOXIMITY or DOXY.ME - The patient will receive a link just prior to their visit by text.     FULL LENGTH CONSENT FOR TELE-HEALTH VISIT   I hereby voluntarily request, consent and authorize [facility] and its employed or contracted physicians, physician assistants, nurse practitioners or other licensed health care professionals (the Practitioner), to provide me with telemedicine health care services (the Services") as deemed necessary by the treating Practitioner. I acknowledge and consent to receive the Services by the Practitioner via telemedicine. I understand that the telemedicine visit will involve communicating with the Practitioner through live audiovisual communication technology and the disclosure of certain medical information by electronic transmission. I acknowledge that I have been given the opportunity to request an in-person assessment or other available alternative prior to the telemedicine visit and am voluntarily participating in the telemedicine visit.  I understand that I have the right to withhold or withdraw my consent to the use of telemedicine in the course of my care at any time, without affecting my right to future care or treatment,  and that the Practitioner or I may terminate the telemedicine visit at any time. I understand that I have the right to inspect all information obtained and/or recorded in the course of the telemedicine visit and may receive copies of available information for a reasonable fee.  I understand that some of the potential risks of receiving the Services via telemedicine include:   Delay or interruption in medical evaluation due to technological equipment failure or disruption;  Information transmitted may not be sufficient (e.g. poor resolution of images) to allow for appropriate medical decision making by the Practitioner; and/or   In rare instances, security protocols could fail, causing a breach of personal health information.  Furthermore, I acknowledge that it is my responsibility to provide information about my medical history, conditions and care that is complete and accurate to the best of my ability. I acknowledge that Practitioner's advice, recommendations, and/or decision may be based on factors not within their control, such as incomplete or inaccurate data provided by me or distortions of diagnostic images or specimens that may result from electronic transmissions. I  understand that the practice of medicine is not an exact science and that Practitioner makes no warranties or guarantees regarding treatment outcomes. I acknowledge that I will receive a copy of this consent concurrently upon execution via email to the email address I last provided but may also request a printed copy by calling the office of [facility].    I understand that my insurance will be billed for this visit.   I have read or had this consent read to me.  I understand the contents of this consent, which adequately explains the benefits and risks of the Services being provided via telemedicine.   I have been provided ample opportunity to ask questions regarding this consent and the Services and have had my questions  answered to my satisfaction.  I give my informed consent for the services to be provided through the use of telemedicine in my medical care  By participating in this telemedicine visit I agree to the above.

## 2019-04-10 NOTE — Telephone Encounter (Signed)
Verified appointment information. Went over [employer] and what to expect.  Virtual Consent has been documented in Epic through [employer]

## 2019-04-10 NOTE — Progress Notes (Signed)
Electrophysiology TeleHealth Note   Due to national recommendations of social distancing due to COVID 19, an audio/video telehealth visit is felt to be most appropriate for this patient at this time.  See Epic message for the patient's consent to telehealth for [facility].   Date:  04/11/2019   ID:  Heather Mosley, DOB 06-10-1966, MRN [id number]  Location: patient's home  Provider location: [address], [place]  Evaluation Performed: Follow-up visit  PCP:  Heather Flow, MD  Cardiologist:  Heather Campus, MD  Electrophysiologist:  Dr Heather Mosley  Chief Complaint:  VT  History of Present Illness:    Heather Mosley is a 53 y.o. female who presents via audio/video conferencing for a telehealth visit today.  Since last being seen in our clinic, the patient reports doing very well.  Today, she denies symptoms of palpitations, chest pain, shortness of breath,  lower extremity edema, dizziness, presyncope, or syncope.  The patient is otherwise without complaint today.  The patient denies symptoms of fevers, chills, cough, or new SOB worrisome for COVID 19.  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  Today, denies symptoms of palpitations, chest pain, shortness of breath, orthopnea, PND, lower extremity edema, claudication, dizziness, presyncope, syncope, bleeding, or neurologic sequela. The patient is tolerating medications without difficulties.  Overall she is doing well.  She has no chest pain or shortness of breath.  She did have an episode of ventricular tachycardia 03/20/2019.  The patient says that she felt dizzy at the time of the episode.  She is felt well since that time.  Past Medical History:  Diagnosis Date  . AICD (automatic cardioverter/defibrillator) present 2015  . Aortic valvular stenoses    "born w/it"  . Arthritis    "knees" (06/07/2017)  . Congenital heart defect   . Heart  murmur   . High cholesterol   . Hypertension   . OSA on CPAP   . Pulmonary embolism ([place]) 2015    Past Surgical History:  Procedure Laterality Date  . ANOMALOUS PULMONARY VENOUS RETURN REPAIR, TOTAL  2015  . AORTIC VALVE REPAIR  1985  . AORTIC VALVE REPLACEMENT  2006   aortic valve/aortic root replacement with porcine valve, and extending aorta and hemi-arch replacement /notes 06/07/2017  . AORTIC VALVE REPLACEMENT    . [place]; 2006; 2015   "prior to my ORs those years"  . CARDIAC DEFIBRILLATOR PLACEMENT  2015  . CARDIAC VALVE REPLACEMENT    . CESAREAN SECTION  2008  . LEFT HEART CATH AND CORONARY ANGIOGRAPHY N/A 06/08/2017   Procedure: Left Heart Cath and Coronary Angiography;  Surgeon: Heather Crome, MD;  Location: [facility];  Service: Cardiovascular;  Laterality: N/A;    Current Outpatient Medications  Medication Sig Dispense Refill  . acetaminophen (TYLENOL) 325 MG tablet Take 650 mg by mouth 2 (two) times daily as needed for moderate pain or headache.     Marland Kitchen aspirin EC 81 MG tablet Take 81 mg by mouth daily.    Marland Kitchen atorvastatin (LIPITOR) 80 MG tablet TAKE 1 TABLET BY MOUTH ONCE DAILY 90 tablet 1  . furosemide (LASIX) 20 MG tablet TAKE 2 TABLETS BY MOUTH DAILY 60 tablet 11  . Glucosamine 500 MG CAPS Take 2 capsules by mouth daily.    . mometasone (NASONEX) 50 MCG/ACT nasal spray Place 2 sprays into the nose daily as needed (  allergies).     . Multiple Vitamin (MULTIVITAMIN WITH MINERALS) TABS tablet Take 1 tablet by mouth at bedtime.    . nitroGLYCERIN (NITROSTAT) 0.4 MG SL tablet Place 1 tablet (0.4 mg total) under the tongue every 5 (five) minutes x 3 doses as needed for chest pain. 25 tablet 11  . warfarin (COUMADIN) 5 MG tablet TAKE AS DIRECTED BY COUMADIN CLINIC. 70 tablet 2  . potassium chloride (K-DUR) 10 MEQ tablet Take 1 tablet (10 mEq total) by mouth daily. (Patient taking differently: Take 10 mEq by mouth as needed. ) 120 tablet 11   No  current facility-administered medications for this visit.     Allergies:   Tape   Social History:  The patient  reports that she has never smoked. She has never used smokeless tobacco. She reports that she does not drink alcohol or use drugs.   Family History:  The patient's  family history includes Hyperlipidemia in her father; Hypertension in her mother; Leukemia in her father.   ROS:  Please see the history of present illness.   All other systems are personally reviewed and negative.    Exam:    Vital Signs:  BP 137/79 (BP Location: Right Arm)   Pulse 77   Well appearing, alert and conversant, regular work of breathing,  good skin color Eyes- anicteric, neuro- grossly intact, skin- no apparent rash or lesions or cyanosis, mouth- oral mucosa is pink   Labs/Other Tests and Data Reviewed:    Recent Labs: 12/31/2018: BUN 12; Creatinine, Ser 0.70; Potassium 4.4; Sodium 143   Wt Readings from Last 3 Encounters:  10/24/18 (!) 305 lb 3.2 oz (138.4 kg)  06/05/18 (!) 327 lb (148.3 kg)  04/09/18 (!) 323 lb (146.5 kg)     Other studies personally reviewed: Additional studies/ records that were reviewed today include: ECG 10/11/17  Review of the above records today demonstrates:  SR  Last device remote is reviewed from [facility] PDF dated 04/08/2019 which reveals normal device function, VT treated with ATP    ASSESSMENT & PLAN:    1.  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 More VT noted on device interrogation.  Heather Mosley start metoprolol 25mg  BID and mexiletine 150 mg.  She does have a normal ejection fraction no coronary artery disease, but I am certainly concerned that she could have some scar tissue in her left ventricle.  If she has more ventricular arrhythmias, I Heather Mosley see if cardiac MRI would be viable with her defibrillator in place to assess scar burden as a possible ablation source.  I have told her no driving per [facility].  2. AVR/aortic root replacment:  stable. No changes  3. Hypertension: Currently well controlled  4. Obesity: Diet and exercise encouraged  5. OSA: CPAP compliance encouraged  6. SVT: asymptomatic. No changes   COVID 19 screen The patient denies symptoms of COVID 19 at this time.  The importance of social distancing was discussed today.  Follow-up:  3 months  Current medicines are reviewed at length with the patient today.   The patient does not have concerns regarding her medicines.  The following changes were made today: Start metoprolol 25 mg twice a day and mexiletine 150 mg twice a day  Labs/ tests ordered today include:  No orders of the defined types were placed in this encounter.    Patient Risk:  after full review of this patients clinical status, I feel that they are at moderate risk at this  time.  Today, I have spent 10 minutes with the patient with telehealth technology discussing ventricular tachycardia.    Signed, Otto Felkins Meredith Leeds, MD  04/11/2019 9:42 AM     [facility] [address] [place] [address] [phone] (office) [phone] (fax)

## 2019-04-11 ENCOUNTER — Telehealth (INDEPENDENT_AMBULATORY_CARE_PROVIDER_SITE_OTHER): Payer: 59 | Admitting: Cardiology

## 2019-04-11 ENCOUNTER — Other Ambulatory Visit: Payer: Self-pay

## 2019-04-11 ENCOUNTER — Telehealth: Payer: Self-pay | Admitting: Cardiology

## 2019-04-11 DIAGNOSIS — Z95810 Presence of automatic (implantable) cardiac defibrillator: Secondary | ICD-10-CM

## 2019-04-11 MED ORDER — MEXILETINE HCL 150 MG PO CAPS: 150.0000 mg | ORAL_CAPSULE | Freq: Two times a day (BID) | ORAL | 4 refills | Status: DC

## 2019-04-11 MED ORDER — METOPROLOL TARTRATE 25 MG PO TABS: 25.0000 mg | ORAL_TABLET | Freq: Two times a day (BID) | ORAL | 4 refills | Status: DC

## 2019-04-11 NOTE — Addendum Note (Signed)
Addended by: Stanton Kidney on: 04/11/2019 10:31 AM   Modules accepted: Orders

## 2019-04-11 NOTE — Progress Notes (Signed)
Pt made aware medication not available at [facility].  [facility] tried [facility] and they did not have. I called and spoke with [facility] in [place] who can order medication and be there Monday afternoon after4pm. Rx sent  Pt notified.

## 2019-04-11 NOTE — Telephone Encounter (Signed)
New Message            Patient is calling in today because her Rx is not available (Mexiletine 150 mg) Patient needs

## 2019-04-11 NOTE — Telephone Encounter (Signed)
Pt made aware medication not available at [facility]  [facility] tried [facility] and they did not have. I called and spoke with [facility] in [place] who can order medication and be there Monday afternoon after4pm. Rx sent  Pt notified.

## 2019-04-11 NOTE — Addendum Note (Signed)
Addended by: Stanton Kidney on: 04/11/2019 05:36 PM   Modules accepted: Orders

## 2019-04-17 ENCOUNTER — Telehealth (INDEPENDENT_AMBULATORY_CARE_PROVIDER_SITE_OTHER): Payer: 59 | Admitting: Cardiology

## 2019-04-17 ENCOUNTER — Other Ambulatory Visit: Payer: Self-pay

## 2019-04-17 ENCOUNTER — Encounter: Payer: Self-pay | Admitting: Cardiology

## 2019-04-17 ENCOUNTER — Telehealth: Payer: Self-pay

## 2019-04-17 VITALS — BP 131/80 | Wt 308.0 lb

## 2019-04-17 DIAGNOSIS — Z95810 Presence of automatic (implantable) cardiac defibrillator: Secondary | ICD-10-CM | POA: Diagnosis not present

## 2019-04-17 DIAGNOSIS — Z86711 Personal history of pulmonary embolism: Secondary | ICD-10-CM

## 2019-04-17 DIAGNOSIS — Z7901 Long term (current) use of anticoagulants: Secondary | ICD-10-CM

## 2019-04-17 DIAGNOSIS — Z954 Presence of other heart-valve replacement: Secondary | ICD-10-CM | POA: Diagnosis not present

## 2019-04-17 DIAGNOSIS — Z952 Presence of prosthetic heart valve: Secondary | ICD-10-CM | POA: Diagnosis not present

## 2019-04-17 NOTE — Progress Notes (Signed)
Virtual Visit via Video Note   This visit type was conducted due to national recommendations for restrictions regarding the COVID-19 Pandemic (e.g. social distancing) in an effort to limit this patient's exposure and mitigate transmission in our community.  Due to her co-morbid illnesses, this patient is at least at moderate risk for complications without adequate follow up.  This format is felt to be most appropriate for this patient at this time.  All issues noted in this document were discussed and addressed.  A limited physical exam was performed with this format.  Please refer to the patient's chart for her consent to telehealth for [facility].  Evaluation Performed:  Follow-up visit  This visit type was conducted due to national recommendations for restrictions regarding the COVID-19 Pandemic (e.g. social distancing).  This format is felt to be most appropriate for this patient at this time.  All issues noted in this document were discussed and addressed.  No physical exam was performed (except for noted visual exam findings with Video Visits).  Please refer to the patient's chart (MyChart message for video visits and phone note for telephone visits) for the patient's consent to telehealth for [facility].  Date:  04/17/2019  ID: Heather Mosley, DOB 1965-12-13, MRN [id number]   Patient Location: [address] [place] [address]   Provider location:   [place] Office  PCP:  Mateo Flow, MD  Cardiologist:  Jenne Campus, MD     Chief Complaint: Doing well  History of Present Illness:    Heather Mosley is a 53 y.o. female  who presents via audio/video conferencing for a telehealth visit today.  With very complex past medical history.  That include aortic valve problem that required multiple interventions last procedure aortic root replacement with aortic valve replacement.  She also has history of ventricular arrhythmias and ICD implanted.  Recent  interrogation of ICD revealed presence of ventricular tachycardia that required ATP pacing to break it.  I do not have this report of the ICD.  However she did see EP team Dr. Curt Bears who decided to start her with mexiletine as well as with metoprolol.  She is doing well denies have any symptoms now however she remember twice in May she got some symptoms and she thinks this is the time that she got her therapy and arrhythmia.  She is very disappointed with the fact that she cannot drive.  Overall she is doing well denies have any chest pain tightness squeezing pressure burning chest.  She can walk and do things around her house with no difficulties.  She was never up to exercises I strongly encouraged her to be a little more active.   The patient does not have symptoms concerning for COVID-19 infection (fever, chills, cough, or new SHORTNESS OF BREATH).    Prior CV studies:   The following studies were reviewed today:  Last echocardiogram done in January 2020 showed:  Impressions:  - 1. Left ventricular systolic function is preserved visually   estimated ejection fraction is 60 to 65%. Moderate concentric   hypertrophy of the left ventricle.   2. Patient records indicate that the patient has 21 mm Saint Jude   mechanical prosthesis. This is in the aortic position. Gradients   are mentioned above. Function appears satisfactory.   3. Trivial mitral regurgitation   4. Artifact of permanent pacemaker seen in the right heart.     Past Medical History:  Diagnosis Date  . AICD (  automatic cardioverter/defibrillator) present 2015  . Aortic valvular stenoses    "born w/it"  . Arthritis    "knees" (06/07/2017)  . Congenital heart defect   . Heart murmur   . High cholesterol   . Hypertension   . OSA on CPAP   . Pulmonary embolism ([place]) 2015    Past Surgical History:  Procedure Laterality Date  . ANOMALOUS PULMONARY VENOUS RETURN REPAIR, TOTAL  2015  . AORTIC VALVE REPAIR  1985  .  AORTIC VALVE REPLACEMENT  2006   aortic valve/aortic root replacement with porcine valve, and extending aorta and hemi-arch replacement /notes 06/07/2017  . AORTIC VALVE REPLACEMENT    . [place]; 2006; 2015   "prior to my ORs those years"  . CARDIAC DEFIBRILLATOR PLACEMENT  2015  . CARDIAC VALVE REPLACEMENT    . CESAREAN SECTION  2008  . LEFT HEART CATH AND CORONARY ANGIOGRAPHY N/A 06/08/2017   Procedure: Left Heart Cath and Coronary Angiography;  Surgeon: Belva Crome, MD;  Location: [facility] [facility];  Service: Cardiovascular;  Laterality: N/A;     Current Meds  Medication Sig  . acetaminophen (TYLENOL) 325 MG tablet Take 650 mg by mouth 2 (two) times daily as needed for moderate pain or headache.   Marland Kitchen aspirin EC 81 MG tablet Take 81 mg by mouth daily.  Marland Kitchen atorvastatin (LIPITOR) 80 MG tablet TAKE 1 TABLET BY MOUTH ONCE DAILY  . furosemide (LASIX) 20 MG tablet TAKE 2 TABLETS BY MOUTH DAILY (Patient taking differently: As needed)  . Glucosamine 500 MG CAPS Take 2 capsules by mouth daily.  . metoprolol tartrate (LOPRESSOR) 25 MG tablet Take 1 tablet (25 mg total) by mouth 2 (two) times daily.  Marland Kitchen mexiletine (MEXITIL) 150 MG capsule Take 1 capsule (150 mg total) by mouth 2 (two) times daily.  . mometasone (NASONEX) 50 MCG/ACT nasal spray Place 2 sprays into the nose daily as needed (allergies).   . Multiple Vitamin (MULTIVITAMIN WITH MINERALS) TABS tablet Take 1 tablet by mouth at bedtime.  . nitroGLYCERIN (NITROSTAT) 0.4 MG SL tablet Place 1 tablet (0.4 mg total) under the tongue every 5 (five) minutes x 3 doses as needed for chest pain.  . potassium chloride (K-DUR) 10 MEQ tablet Take 1 tablet (10 mEq total) by mouth daily. (Patient taking differently: Take 10 mEq by mouth as needed. )  . warfarin (COUMADIN) 5 MG tablet TAKE AS DIRECTED BY COUMADIN CLINIC.      Family History: The patient's family history includes Hyperlipidemia in her father; Hypertension in her  mother; Leukemia in her father.   ROS:   Please see the history of present illness.     All other systems reviewed and are negative.   Labs/Other Tests and Data Reviewed:     Recent Labs: 12/31/2018: BUN 12; Creatinine, Ser 0.70; Potassium 4.4; Sodium 143  Recent Lipid Panel    Component Value Date/Time   CHOL 155 12/31/2018 0856   TRIG 184 (H) 12/31/2018 0856   HDL 45 12/31/2018 0856   CHOLHDL 3.4 12/31/2018 0856   LDLCALC 73 12/31/2018 0856      Exam:    Vital Signs:  BP 131/80   Wt (!) 308 lb (139.7 kg)   BMI 58.20 kg/m     Wt Readings from Last 3 Encounters:  04/17/19 (!) 308 lb (139.7 kg)  10/24/18 (!) 305 lb 3.2 oz (138.4 kg)  06/05/18 (!) 327 lb (148.3 kg)     Well nourished, well developed  in no acute distress. Alert awake inclined to be quite happy to be able to talk to me over the phone not in any distress like always very cheerful.  Diagnosis for this visit:   1. ICD (implantable cardioverter-defibrillator) in place   2. Morbid obesity ([place])   3. 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 History of pulmonary embolism      ASSESSMENT & PLAN:    1.  ICD present normal function interrogation revealed presence of ventricular tachycardia that required ATP therapy to break it.  Antiarrhythmic therapy has been initiated with mexiletine as well as beta-blocker.  Will continue.  Denies having event since then.  Disappointed about the fact that she cannot drive.  I explained to her again that this is a low of no carina. 2.  Morbid obesity obviously a problem this is a chronic problem she been struggling with all her life.  Trying to do better. 3.  Status post combined aortic root and valve replacement with doing well last function of the valve assessed in January was good.  Left ventricular ejection fraction normal. 4.  History of pulmonary emboli.  Stable on anticoagulation which I will continue    COVID-19 Education: The signs and symptoms of COVID-19 were discussed with the patient and how to seek care for testing (follow up with PCP or arrange E-visit).  The importance of social distancing was discussed today.  Patient Risk:   After full review of this patients clinical status, I feel that they are at least moderate risk at this time.  Time:   Today, I have spent 22 minutes with the patient with telehealth technology discussing pt health issues.  I spent 10 minutes reviewing her chart before the visit.  Visit was finished at 9:45 AM.    Medication Adjustments/Labs and Tests Ordered: Current medicines are reviewed at length with the patient today.  Concerns regarding medicines are outlined above.  No orders of the defined types were placed in this encounter.  Medication changes: No orders of the defined types were placed in this encounter.    Disposition: Follow-up 5 months  Signed, Park Liter, MD, [facility] 04/17/2019 9:44 AM    [place]

## 2019-04-17 NOTE — Telephone Encounter (Signed)

## 2019-04-17 NOTE — Patient Instructions (Signed)
Medication Instructions:  Your physician recommends that you continue on your current medications as directed. Please refer to the Current Medication list given to you today.  If you need a refill on your cardiac medications before your next appointment, please call your pharmacy.   Lab work: None.  If you have labs (blood work) drawn today and your tests are completely normal, you will receive your results only by: . MyChart Message (if you have MyChart) OR . A paper copy in the mail If you have any lab test that is abnormal or we need to change your treatment, we will call you to review the results.  Testing/Procedures: None.    Follow-Up: At [facility], you and your health needs are our priority.  As part of our continuing mission to provide you with exceptional heart care, we have created designated Provider Care Teams.  These Care Teams include your primary Cardiologist (physician) and Advanced Practice Providers (APPs -  Physician Assistants and Nurse Practitioners) who all work together to provide you with the care you need, when you need it. You will need a follow up appointment in 5 months.  Please call our office 2 months in advance to schedule this appointment.  You may see Robert Krasowski, MD or another member of our [facility] Provider Team in [place]: Brian Munley, MD . Rajan Revankar, MD  Any Other Special Instructions Will Be Listed Below (If Applicable).     

## 2019-04-22 ENCOUNTER — Other Ambulatory Visit: Payer: Self-pay

## 2019-04-22 ENCOUNTER — Ambulatory Visit (INDEPENDENT_AMBULATORY_CARE_PROVIDER_SITE_OTHER): Payer: 59 | Admitting: *Deleted

## 2019-04-22 DIAGNOSIS — Z952 Presence of prosthetic heart valve: Secondary | ICD-10-CM | POA: Diagnosis not present

## 2019-04-22 DIAGNOSIS — Z86711 Personal history of pulmonary embolism: Secondary | ICD-10-CM | POA: Diagnosis not present

## 2019-04-22 DIAGNOSIS — Z5181 Encounter for therapeutic drug level monitoring: Secondary | ICD-10-CM

## 2019-04-22 LAB — POCT INR: INR: 1.8 — AB (ref 2.0–3.0)

## 2019-04-22 NOTE — Patient Instructions (Signed)
Description   Take 2.5 tablets tonight,  then start taking 2.5 tablets daily except 2 tablets Sundays, Tuesdays and Thursdays.  Recheck INR in 2 weeks at the [place] office. Please call the office with any medication changes or additions [phone]. Resume normal greens intake

## 2019-04-29 ENCOUNTER — Telehealth: Payer: Self-pay

## 2019-04-29 NOTE — Telephone Encounter (Signed)

## 2019-05-06 ENCOUNTER — Ambulatory Visit (INDEPENDENT_AMBULATORY_CARE_PROVIDER_SITE_OTHER): Payer: 59 | Admitting: *Deleted

## 2019-05-06 ENCOUNTER — Other Ambulatory Visit: Payer: Self-pay

## 2019-05-06 DIAGNOSIS — Z952 Presence of prosthetic heart valve: Secondary | ICD-10-CM | POA: Diagnosis not present

## 2019-05-06 DIAGNOSIS — Z5181 Encounter for therapeutic drug level monitoring: Secondary | ICD-10-CM

## 2019-05-06 DIAGNOSIS — Z86711 Personal history of pulmonary embolism: Secondary | ICD-10-CM | POA: Diagnosis not present

## 2019-05-06 LAB — POCT INR: INR: 3.8 — AB (ref 2.0–3.0)

## 2019-05-06 NOTE — Patient Instructions (Signed)
Description   Skip today's dose, then change your dose to 2 tablets daily except 2.5 tablets on Mondays, Wednesdays and Fridays. Recheck INR in 2 weeks at the [place] office. Please call the office with any medication changes or additions [phone]. Resume normal greens intake

## 2019-05-13 ENCOUNTER — Telehealth: Payer: Self-pay

## 2019-05-13 NOTE — Telephone Encounter (Signed)
lmom for prescreen  

## 2019-05-19 IMAGING — CR DG CHEST 2V
2 series · 2 of 2 positions shown · non-contrast
Comparison: 11/03/2014

CLINICAL DATA: Chest pressure.

EXAM:
CHEST  2 VIEW

[chest pa]
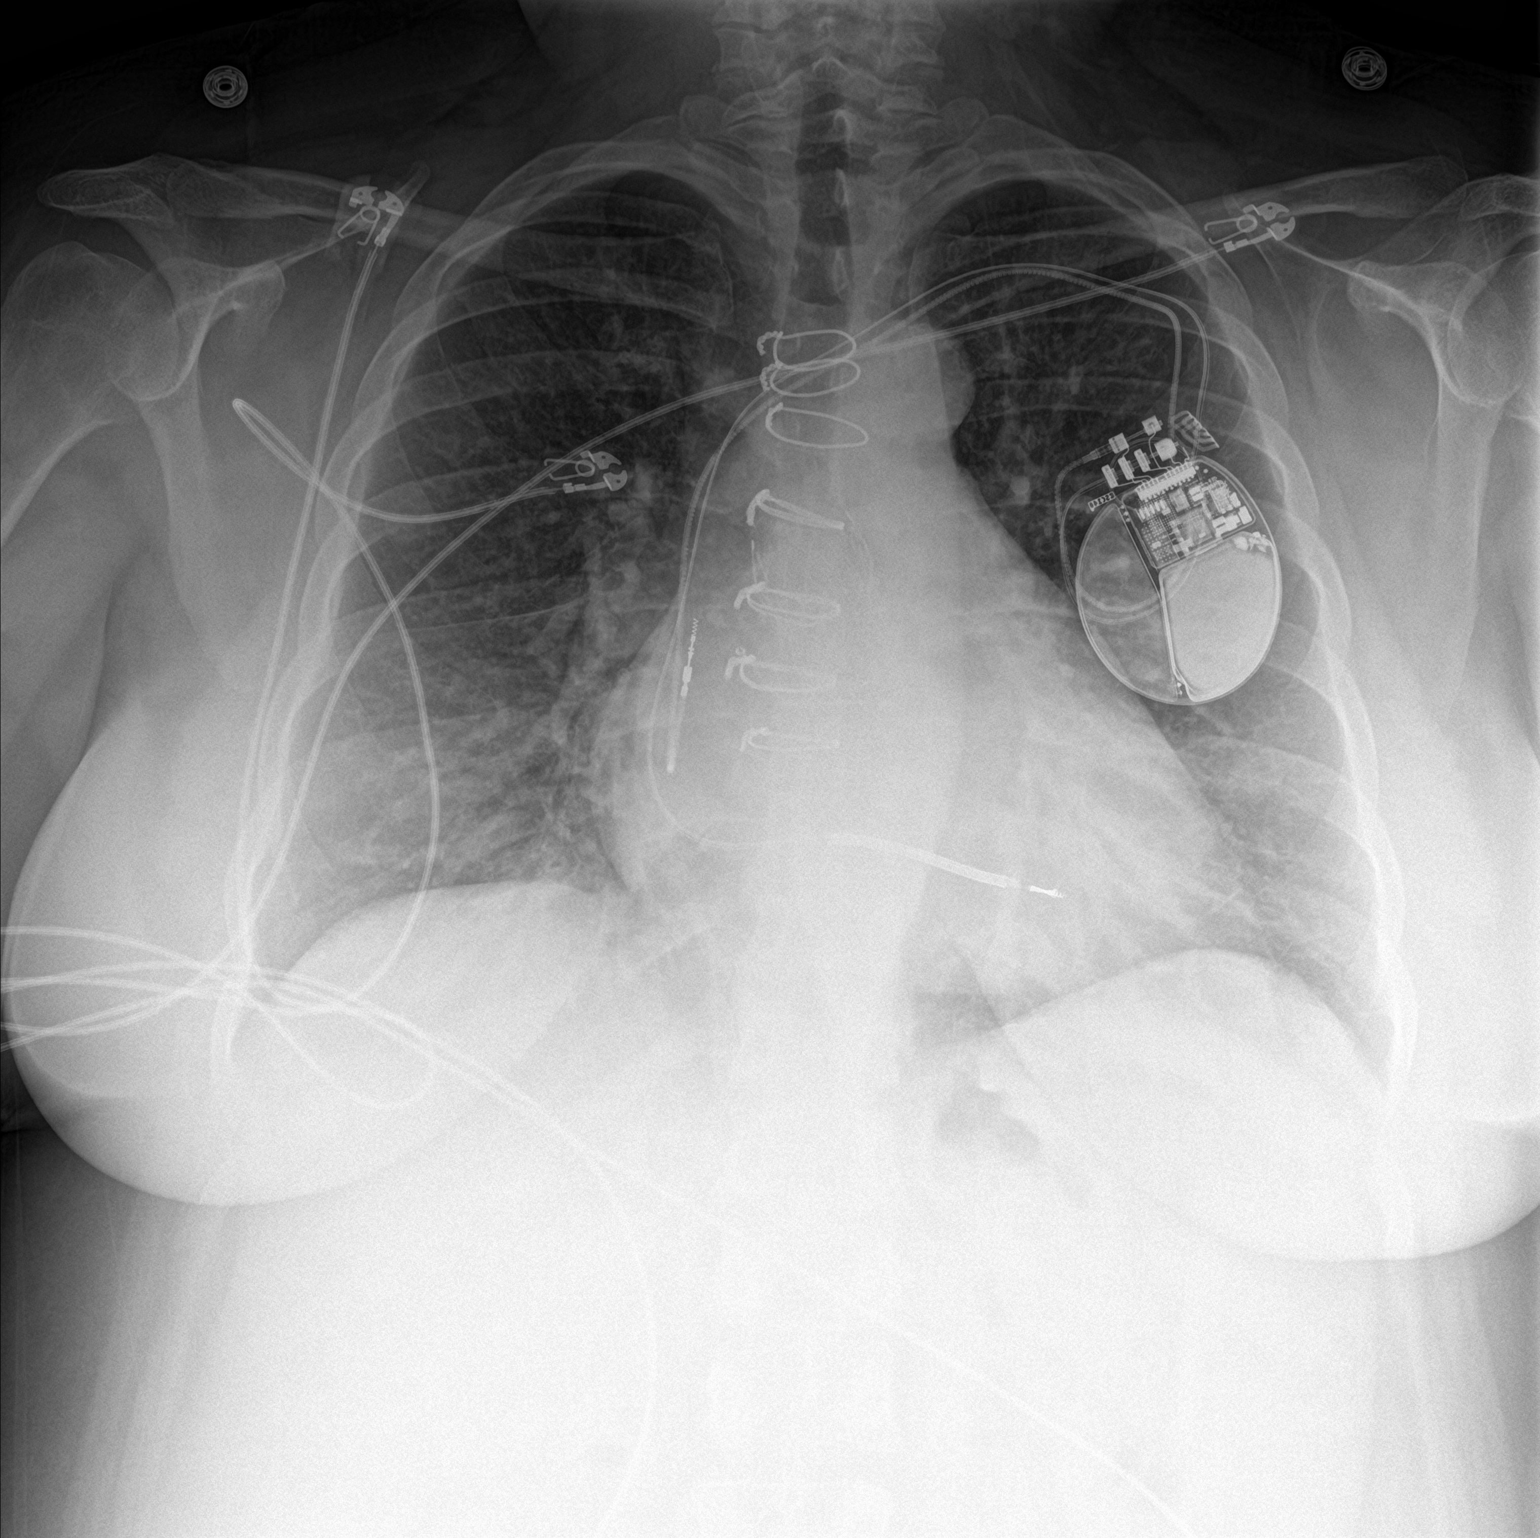

[chest lat]
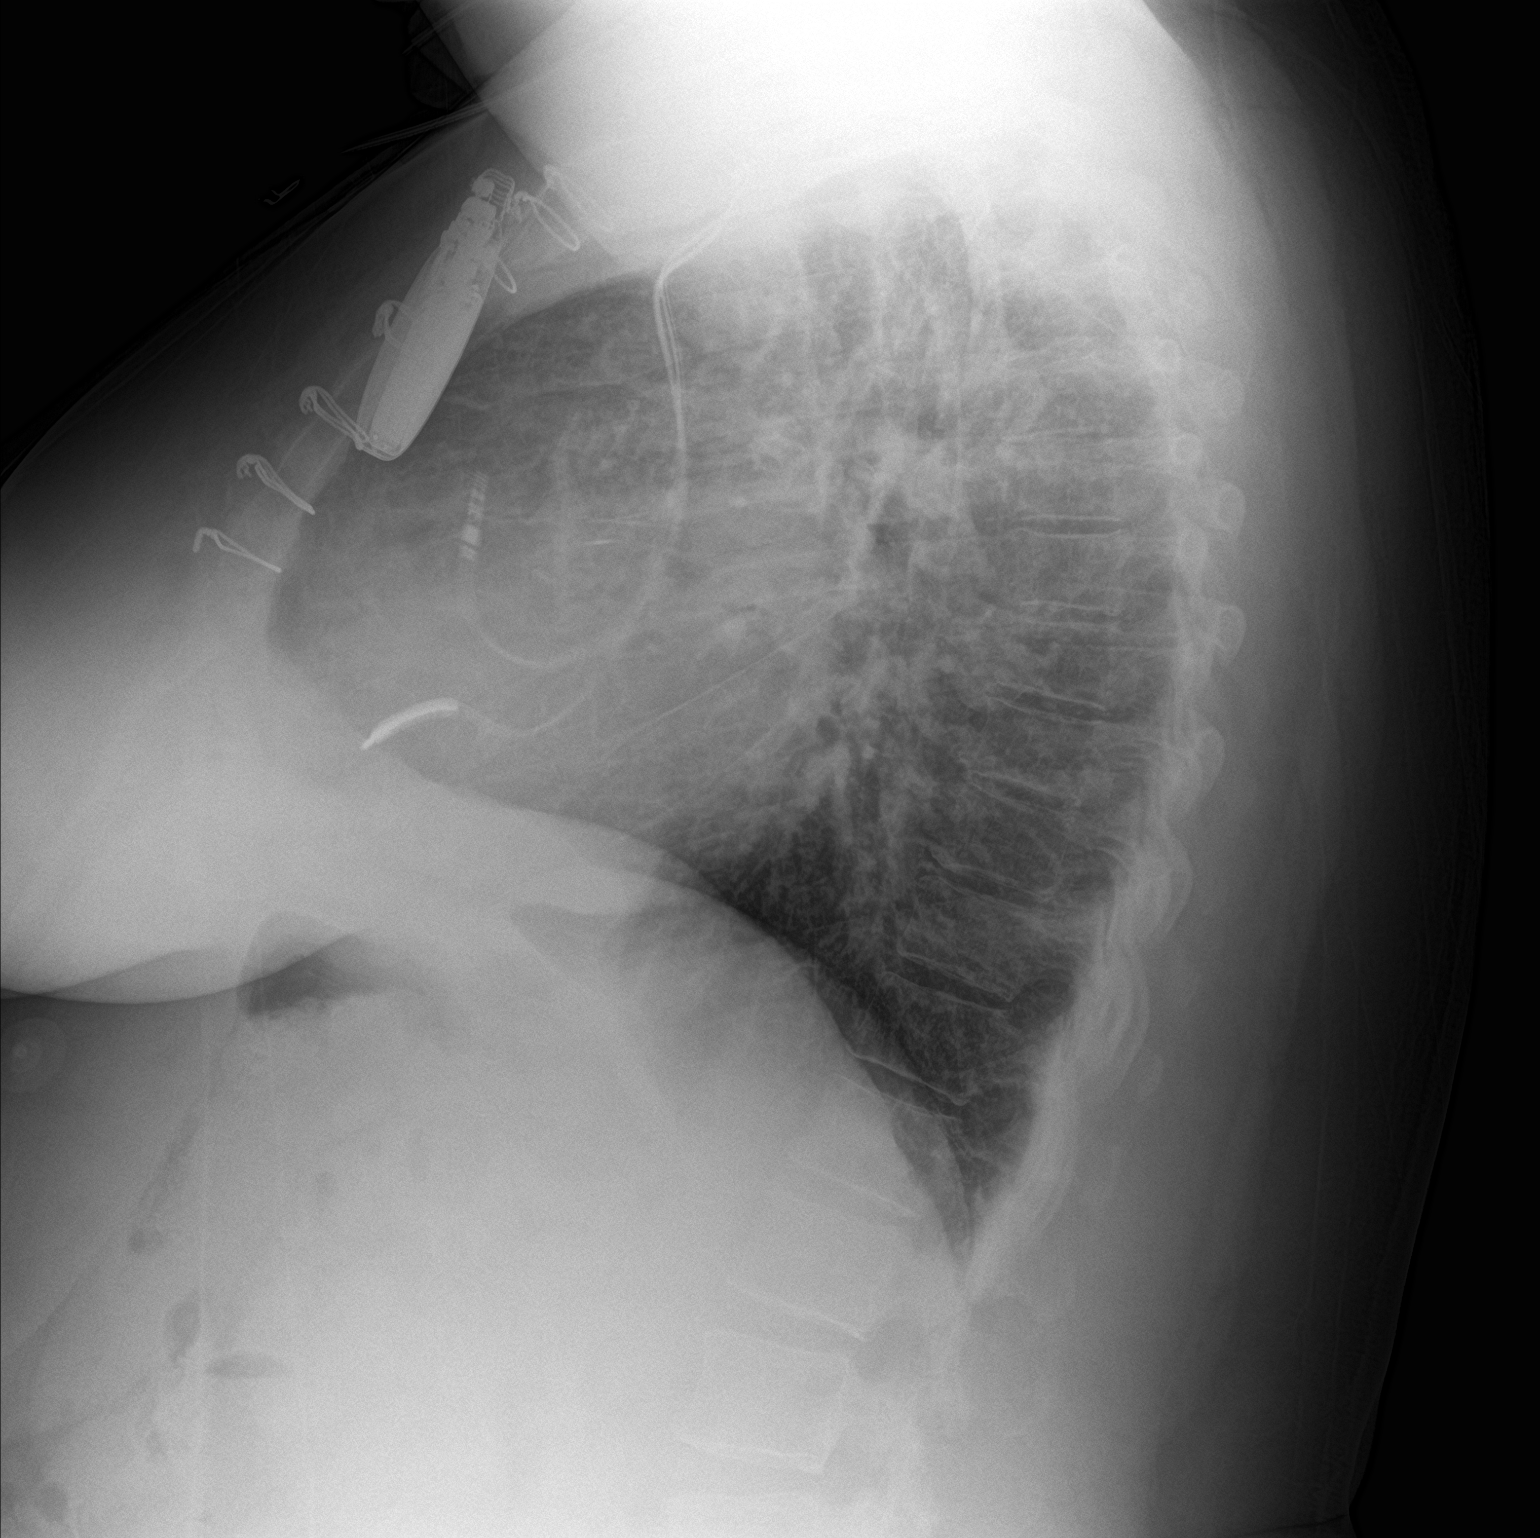

[2 of 2 positions shown; findings below may reference images not displayed]

FINDINGS: Dual lead cardiac pacemaker, median sternotomy changes and vascular
stent are unchanged radiographically.

The cardiac silhouette is mildly enlarged. Mediastinal contours
appear intact. Tortuosity of the aorta.

There is no evidence of focal airspace consolidation, pleural
effusion or pneumothorax. Mild pulmonary vascular congestion.

Osseous structures are without acute abnormality. Soft tissues are
grossly normal.
IMPRESSION: Enlarged heart.

Tortuosity of the thoracic aorta.

Mild pulmonary vascular congestion.

## 2019-05-20 ENCOUNTER — Other Ambulatory Visit: Payer: Self-pay

## 2019-05-20 ENCOUNTER — Ambulatory Visit (INDEPENDENT_AMBULATORY_CARE_PROVIDER_SITE_OTHER): Payer: 59 | Admitting: *Deleted

## 2019-05-20 DIAGNOSIS — Z952 Presence of prosthetic heart valve: Secondary | ICD-10-CM | POA: Diagnosis not present

## 2019-05-20 DIAGNOSIS — Z86711 Personal history of pulmonary embolism: Secondary | ICD-10-CM | POA: Diagnosis not present

## 2019-05-20 DIAGNOSIS — Z5181 Encounter for therapeutic drug level monitoring: Secondary | ICD-10-CM | POA: Diagnosis not present

## 2019-05-20 LAB — POCT INR: INR: 2.7 (ref 2.0–3.0)

## 2019-05-20 NOTE — Patient Instructions (Signed)
Description   Continue taking 2 tablets daily except 2.5 tablets on Mondays, Wednesdays and Fridays. Recheck INR in 2 weeks at the [place] office. Please call the office with any medication changes or additions [phone]. Resume normal greens intake

## 2019-06-02 ENCOUNTER — Telehealth: Payer: Self-pay

## 2019-06-02 NOTE — Telephone Encounter (Signed)

## 2019-06-03 ENCOUNTER — Ambulatory Visit (INDEPENDENT_AMBULATORY_CARE_PROVIDER_SITE_OTHER): Payer: 59 | Admitting: *Deleted

## 2019-06-03 ENCOUNTER — Other Ambulatory Visit: Payer: Self-pay

## 2019-06-03 DIAGNOSIS — Z5181 Encounter for therapeutic drug level monitoring: Secondary | ICD-10-CM | POA: Diagnosis not present

## 2019-06-03 DIAGNOSIS — Z952 Presence of prosthetic heart valve: Secondary | ICD-10-CM | POA: Diagnosis not present

## 2019-06-03 DIAGNOSIS — Z86711 Personal history of pulmonary embolism: Secondary | ICD-10-CM | POA: Diagnosis not present

## 2019-06-03 LAB — POCT INR: INR: 3.2 — AB (ref 2.0–3.0)

## 2019-06-03 NOTE — Patient Instructions (Signed)
Description   Today only take 1 tablet, then change your dose to 2 tablets daily except 2.5 tablets on Mondays and Fridays. Recheck INR in 4 weeks at the [place] office. Please call the office with any medication changes or additions [phone]. Resume normal greens intake

## 2019-06-04 ENCOUNTER — Ambulatory Visit (INDEPENDENT_AMBULATORY_CARE_PROVIDER_SITE_OTHER): Payer: 59 | Admitting: *Deleted

## 2019-06-04 DIAGNOSIS — I469 Cardiac arrest, cause unspecified: Secondary | ICD-10-CM | POA: Diagnosis not present

## 2019-06-04 DIAGNOSIS — I5023 Acute on chronic systolic (congestive) heart failure: Secondary | ICD-10-CM

## 2019-06-04 LAB — CUP PACEART REMOTE DEVICE CHECK
Battery Remaining Longevity: 120 mo
Battery Remaining Percentage: 100 %
Brady Statistic RA Percent Paced: 35 %
Brady Statistic RV Percent Paced: 4 %
Date Time Interrogation Session: 20200722070100
HighPow Impedance: 80 Ohm
Implantable Lead Implant Date: 20151228
Implantable Lead Implant Date: 20151228
Implantable Lead Location: 753859
Implantable Lead Location: 753860
Implantable Lead Model: 292
Implantable Lead Model: 5076
Implantable Lead Serial Number: 358765
Implantable Pulse Generator Implant Date: 20151228
Lead Channel Impedance Value: 592 Ohm
Lead Channel Impedance Value: 634 Ohm
Lead Channel Pacing Threshold Amplitude: 0.8 V
Lead Channel Pacing Threshold Amplitude: 1 V
Lead Channel Pacing Threshold Pulse Width: 0.4 ms
Lead Channel Pacing Threshold Pulse Width: 0.4 ms
Lead Channel Setting Pacing Amplitude: 2 V
Lead Channel Setting Pacing Amplitude: 2.4 V
Lead Channel Setting Pacing Pulse Width: 0.4 ms
Lead Channel Setting Sensing Sensitivity: 0.5 mV
Pulse Gen Serial Number: 508420

## 2019-06-07 ENCOUNTER — Other Ambulatory Visit: Payer: Self-pay | Admitting: Cardiology

## 2019-06-07 DIAGNOSIS — Z952 Presence of prosthetic heart valve: Secondary | ICD-10-CM

## 2019-06-18 NOTE — Progress Notes (Signed)
Remote ICD transmission.   

## 2019-06-26 ENCOUNTER — Telehealth: Payer: Self-pay | Admitting: Cardiology

## 2019-06-26 NOTE — Telephone Encounter (Signed)
Has questions about her coumadin dosage

## 2019-07-01 ENCOUNTER — Ambulatory Visit (INDEPENDENT_AMBULATORY_CARE_PROVIDER_SITE_OTHER): Payer: 59 | Admitting: *Deleted

## 2019-07-01 ENCOUNTER — Other Ambulatory Visit: Payer: Self-pay

## 2019-07-01 DIAGNOSIS — Z952 Presence of prosthetic heart valve: Secondary | ICD-10-CM

## 2019-07-01 DIAGNOSIS — Z86711 Personal history of pulmonary embolism: Secondary | ICD-10-CM | POA: Diagnosis not present

## 2019-07-01 DIAGNOSIS — Z5181 Encounter for therapeutic drug level monitoring: Secondary | ICD-10-CM | POA: Diagnosis not present

## 2019-07-01 LAB — POCT INR: INR: 1.7 — AB (ref 2.0–3.0)

## 2019-07-01 NOTE — Patient Instructions (Signed)
Description   Today take 2.5 tablets, then  2 tablets daily except 2.5 tablets on Mondays and Fridays. Recheck INR in 2 weeks at the [place] office. Please call the office with any medication changes or additions [phone]. Resume normal greens intake

## 2019-07-14 ENCOUNTER — Encounter: Payer: Self-pay | Admitting: Cardiology

## 2019-07-14 ENCOUNTER — Ambulatory Visit (INDEPENDENT_AMBULATORY_CARE_PROVIDER_SITE_OTHER): Payer: 59 | Admitting: Cardiology

## 2019-07-14 ENCOUNTER — Other Ambulatory Visit: Payer: Self-pay

## 2019-07-14 VITALS — BP 128/74 | HR 75 | Ht 61.0 in | Wt 323.0 lb

## 2019-07-14 DIAGNOSIS — Z95810 Presence of automatic (implantable) cardiac defibrillator: Secondary | ICD-10-CM | POA: Diagnosis not present

## 2019-07-14 DIAGNOSIS — I472 Ventricular tachycardia, unspecified: Secondary | ICD-10-CM

## 2019-07-14 DIAGNOSIS — I5023 Acute on chronic systolic (congestive) heart failure: Secondary | ICD-10-CM | POA: Diagnosis not present

## 2019-07-14 NOTE — Patient Instructions (Signed)
Medication Instructions:  Your physician recommends that you continue on your current medications as directed. Please refer to the Current Medication list given to you today.  *If you need a refill on your cardiac medications before your next appointment, please call your pharmacy*  Labwork: None ordered  Testing/Procedures: None ordered  Follow-Up: Remote monitoring is used to monitor your Pacemaker or ICD from home. This monitoring reduces the number of office visits required to check your device to one time per year. It allows [place] to keep an eye on the functioning of your device to ensure it is working properly. You are scheduled for a device check from home on 09/04/2019. You may send your transmission at any time that day. If you have a wireless device, the transmission will be sent automatically. After your physician reviews your transmission, you will receive a postcard with your next transmission date.  Your physician wants you to follow-up in: 6 months with Dr. Curt Bears.  You will receive a reminder letter in the mail two months in advance. If you don't receive a letter, please call our office to schedule the follow-up appointment.  Thank you for choosing [facility]!!   Trinidad Curet, RN [phone]

## 2019-07-14 NOTE — Progress Notes (Signed)
Electrophysiology Office Note   Date:  07/14/2019   ID:  Heather Mosley, DOB [birth year], MRN [id number]  PCP:  Mateo Flow, MD  Cardiologist:  Agustin Cree Primary Electrophysiologist:   Meredith Leeds, MD    No chief complaint on file.    History of Present Illness: Heather Mosley is a 53 y.o. female who is being seen today for the evaluation of ventricular tachycardia, ICD at the request of Jenne Campus. Presenting today for electrophysiology evaluation.  She has a history of aortic valve/aortic root replacement with porcine valve and extending aorta and hemi-arch replacement in 2006, hypertension, and obesity.  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  Shortly after she developed a PE and cardiac arrest secondary to VT.  She had an ICD implanted at [facility] and was placed on Xarelto.  She was on this for 1 year which has since been stopped.  Today, denies symptoms of palpitations, chest pain, shortness of breath, orthopnea, PND, lower extremity edema, claudication, dizziness, presyncope, syncope, bleeding, or neurologic sequela. The patient is tolerating medications without difficulties. Overall she is feeling well. Has had no further episode of VT since starting mexiletine.    Past Medical History:  Diagnosis Date  . AICD (automatic cardioverter/defibrillator) present 2015  . Aortic valvular stenoses    "born w/it"  . Arthritis    "knees" (06/07/2017)  . Congenital heart defect   . Heart murmur   . High cholesterol   . Hypertension   . OSA on CPAP   . Pulmonary embolism ([place]) 2015   Past Surgical History:  Procedure Laterality Date  . ANOMALOUS PULMONARY VENOUS RETURN REPAIR, TOTAL  2015  . AORTIC VALVE REPAIR  1985  . AORTIC VALVE REPLACEMENT  2006    aortic valve/aortic root replacement with porcine valve, and extending aorta and hemi-arch replacement /notes 06/07/2017  . AORTIC VALVE REPLACEMENT    . [place]; 2006; 2015   "prior to my ORs those years"  . CARDIAC DEFIBRILLATOR PLACEMENT  2015  . CARDIAC VALVE REPLACEMENT    . CESAREAN SECTION  2008  . LEFT HEART CATH AND CORONARY ANGIOGRAPHY N/A 06/08/2017   Procedure: Left Heart Cath and Coronary Angiography;  Surgeon: Belva Crome, MD;  Location: [place] [facility];  Service: Cardiovascular;  Laterality: N/A;     Current Outpatient Medications  Medication Sig Dispense Refill  . acetaminophen (TYLENOL) 325 MG tablet Take 650 mg by mouth 2 (two) times daily as needed for moderate pain or headache.     Marland Kitchen aspirin EC 81 MG tablet Take 81 mg by mouth daily.    Marland Kitchen atorvastatin (LIPITOR) 80 MG tablet TAKE 1 TABLET BY MOUTH ONCE DAILY 90 tablet 1  . diclofenac sodium (VOLTAREN) 1 % GEL Apply topically as needed.    . furosemide (LASIX) 20 MG tablet TAKE 2 TABLETS BY MOUTH DAILY 60 tablet 11  . Glucosamine 500 MG CAPS Take 2 capsules by mouth daily.    . metoprolol tartrate (LOPRESSOR) 25 MG tablet Take 1 tablet (25 mg total) by mouth 2 (two) times daily. 60 tablet 4  . mexiletine (MEXITIL) 150 MG capsule Take 1 capsule (150 mg total) by mouth 2 (two) times daily. 60 capsule 4  . nitroGLYCERIN (NITROSTAT) 0.4 MG SL tablet Place 1 tablet (0.4  mg total) under the tongue every 5 (five) minutes x 3 doses as needed for chest pain. 25 tablet 11  . potassium chloride (K-DUR) 10 MEQ tablet Take 1 tablet (10 mEq total) by mouth daily. 120 tablet 11  . warfarin (COUMADIN) 5 MG tablet TAKE AS DIRECTED BY COUMADIN CLINIC. 70 tablet 2   No current facility-administered medications for this visit.     Allergies:   Tape   Social History:  The patient  reports that she has never smoked. She has never used smokeless tobacco. She reports that she does not drink alcohol or use drugs.    Family History:  The patient's family history includes Hyperlipidemia in her father; Hypertension in her mother; Leukemia in her father.    ROS:  Please see the history of present illness.   Otherwise, review of systems is positive for none.   All other systems are reviewed and negative.   PHYSICAL EXAM: VS:  BP 128/74   Pulse 75   Ht 5\' 1"  (1.549 m)   Wt (!) 323 lb (146.5 kg)   SpO2 97%   BMI 61.03 kg/m  , BMI Body mass index is 61.03 kg/m. GEN: Well nourished, well developed, in no acute distress  HEENT: normal  Neck: no JVD, carotid bruits, or masses Cardiac: RRR; no murmurs, rubs, or gallops,no edema  Respiratory:  clear to auscultation bilaterally, normal work of breathing GI: soft, nontender, nondistended, + BS MS: no deformity or atrophy  Skin: warm and dry, device site well healed Neuro:  Strength and sensation are intact Psych: euthymic mood, full affect  EKG:  EKG is ordered today. Personal review of the ekg ordered shows A paced, rate 75  Personal review of the device interrogation today. Results in [place]: 12/31/2018: BUN 12; Creatinine, Ser 0.70; Potassium 4.4; Sodium 143    Lipid Panel     Component Value Date/Time   CHOL 155 12/31/2018 0856   TRIG 184 (H) 12/31/2018 0856   HDL 45 12/31/2018 0856   CHOLHDL 3.4 12/31/2018 0856   LDLCALC 73 12/31/2018 0856     Wt Readings from Last 3 Encounters:  07/14/19 (!) 323 lb (146.5 kg)  04/17/19 (!) 308 lb (139.7 kg)  10/24/18 (!) 305 lb 3.2 oz (138.4 kg)      Other studies Reviewed: Additional studies/ records that were reviewed today include: TTE 11/01/18  Review of the above records today demonstrates:  - Left ventricle: The cavity size was normal. There was moderate   concentric hypertrophy. Systolic function was normal. The   estimated ejection fraction was in the range of 55% to 65%. Wall   motion was normal; there were no regional wall motion   abnormalities. - Aortic valve:  Valve area (VTI): 1.4 cm^2. Valve area (Vmax): 1.37   cm^2. Valve area (Vmean): 1.19 cm^2. - Left atrium: The atrium was mildly dilated.   ASSESSMENT AND PLAN:  1.  VT arrest: Status post Stokes implanted 2015.  Functioning appropriately.  Has been put on mexiletine for treated VT.  plan for continued therapy. No driving for the next 3 months.  2.  Aortic valve/aortic root replacement: Currently stable followed by Agustin Cree.  No changes.  3.  Hypertension: well controlled  4.  Obesity: Encouraged  5.  OSA: CPAP compliance encouraged  6.  SVT: found on device interrogation. Would prefer to hold off on further therapy.  Current medicines are reviewed at length with the patient today.  The patient does not have concerns regarding her medicines.  The following changes were made today:  none  Labs/ tests ordered today include:  Orders Placed This Encounter  Procedures  . EKG 12-Lead     Disposition:   FU with   6 months  Signed,  Meredith Leeds, MD  07/14/2019 9:52 AM     [facility] [address] [place] [address] [phone] (office) [phone] (fax)

## 2019-07-15 ENCOUNTER — Ambulatory Visit (INDEPENDENT_AMBULATORY_CARE_PROVIDER_SITE_OTHER): Payer: 59 | Admitting: *Deleted

## 2019-07-15 DIAGNOSIS — Z5181 Encounter for therapeutic drug level monitoring: Secondary | ICD-10-CM | POA: Diagnosis not present

## 2019-07-15 DIAGNOSIS — Z86711 Personal history of pulmonary embolism: Secondary | ICD-10-CM

## 2019-07-15 DIAGNOSIS — Z952 Presence of prosthetic heart valve: Secondary | ICD-10-CM | POA: Diagnosis not present

## 2019-07-15 LAB — POCT INR: INR: 2.2 (ref 2.0–3.0)

## 2019-07-15 NOTE — Patient Instructions (Signed)
Description   Continue taking 2 tablets daily except 2.5 tablets on Mondays and Fridays. Recheck INR in 2 weeks at the [place] office. Please call the office with any medication changes or additions [phone]. Resume normal greens intake

## 2019-08-05 ENCOUNTER — Ambulatory Visit (INDEPENDENT_AMBULATORY_CARE_PROVIDER_SITE_OTHER): Payer: 59 | Admitting: Pharmacist Clinician (PhC)/ Clinical Pharmacy Specialist

## 2019-08-05 ENCOUNTER — Other Ambulatory Visit: Payer: Self-pay

## 2019-08-05 DIAGNOSIS — Z5181 Encounter for therapeutic drug level monitoring: Secondary | ICD-10-CM

## 2019-08-05 DIAGNOSIS — Z86711 Personal history of pulmonary embolism: Secondary | ICD-10-CM

## 2019-08-05 DIAGNOSIS — Z952 Presence of prosthetic heart valve: Secondary | ICD-10-CM

## 2019-08-05 LAB — POCT INR: INR: 2.6 (ref 2.0–3.0)

## 2019-08-12 ENCOUNTER — Telehealth: Payer: Self-pay | Admitting: Emergency Medicine

## 2019-08-12 DIAGNOSIS — I4891 Unspecified atrial fibrillation: Secondary | ICD-10-CM

## 2019-08-12 MED ORDER — METOPROLOL TARTRATE 50 MG PO TABS: 50.0000 mg | ORAL_TABLET | Freq: Two times a day (BID) | ORAL | 3 refills | Status: DC

## 2019-08-12 NOTE — Telephone Encounter (Signed)
Patient c/o palpitations and feeling her heart race for approximately 30 minutes. No CP, syncope, or SOB. + OAC. Remote transmission sent and confirmed that patient had 3 episodes of AT/AF on 08/12/19 from 0638 to 0712 with longest episode lasting 33 minutes with highest v-rate of 120. Dr Curt Bears given report on patient and transmissions reviewed. Metoprolol dose increased to 50 mg twice a day. Patient given medication change information and stated she understands ED precautions if she has syncope ,CP , or SOB.

## 2019-08-30 ENCOUNTER — Other Ambulatory Visit: Payer: Self-pay | Admitting: Cardiology

## 2019-09-02 ENCOUNTER — Other Ambulatory Visit: Payer: Self-pay

## 2019-09-02 ENCOUNTER — Ambulatory Visit (INDEPENDENT_AMBULATORY_CARE_PROVIDER_SITE_OTHER): Payer: 59 | Admitting: Pharmacist Clinician (PhC)/ Clinical Pharmacy Specialist

## 2019-09-02 DIAGNOSIS — Z86711 Personal history of pulmonary embolism: Secondary | ICD-10-CM | POA: Diagnosis not present

## 2019-09-02 DIAGNOSIS — Z952 Presence of prosthetic heart valve: Secondary | ICD-10-CM

## 2019-09-02 DIAGNOSIS — Z5181 Encounter for therapeutic drug level monitoring: Secondary | ICD-10-CM

## 2019-09-02 LAB — POCT INR: INR: 2.8 (ref 2.0–3.0)

## 2019-09-04 ENCOUNTER — Ambulatory Visit (INDEPENDENT_AMBULATORY_CARE_PROVIDER_SITE_OTHER): Payer: 59 | Admitting: *Deleted

## 2019-09-04 DIAGNOSIS — I469 Cardiac arrest, cause unspecified: Secondary | ICD-10-CM

## 2019-09-04 DIAGNOSIS — I5023 Acute on chronic systolic (congestive) heart failure: Secondary | ICD-10-CM

## 2019-09-04 LAB — CUP PACEART REMOTE DEVICE CHECK
Battery Remaining Longevity: 114 mo
Battery Remaining Percentage: 100 %
Brady Statistic RA Percent Paced: 58 %
Brady Statistic RV Percent Paced: 1 %
Date Time Interrogation Session: 20201022161933
HighPow Impedance: 80 Ohm
Implantable Lead Implant Date: 20151228
Implantable Lead Implant Date: 20151228
Implantable Lead Location: 753859
Implantable Lead Location: 753860
Implantable Lead Model: 292
Implantable Lead Model: 5076
Implantable Lead Serial Number: 358765
Implantable Pulse Generator Implant Date: 20151228
Lead Channel Impedance Value: 507 Ohm
Lead Channel Impedance Value: 530 Ohm
Lead Channel Pacing Threshold Amplitude: 0.7 V
Lead Channel Pacing Threshold Amplitude: 1.1 V
Lead Channel Pacing Threshold Pulse Width: 0.4 ms
Lead Channel Pacing Threshold Pulse Width: 0.4 ms
Lead Channel Setting Pacing Amplitude: 2 V
Lead Channel Setting Pacing Amplitude: 2.4 V
Lead Channel Setting Pacing Pulse Width: 0.4 ms
Lead Channel Setting Sensing Sensitivity: 0.5 mV
Pulse Gen Serial Number: 508420

## 2019-09-15 ENCOUNTER — Other Ambulatory Visit: Payer: Self-pay | Admitting: Cardiology

## 2019-09-15 DIAGNOSIS — Z952 Presence of prosthetic heart valve: Secondary | ICD-10-CM

## 2019-09-16 NOTE — Progress Notes (Signed)
Remote ICD transmission.   

## 2019-09-19 LAB — CUP PACEART INCLINIC DEVICE CHECK
Brady Statistic RA Percent Paced: 37 %
Brady Statistic RV Percent Paced: 3 %
Date Time Interrogation Session: 20200831040000
HighPow Impedance: 81 Ohm
Implantable Lead Implant Date: 20151228
Implantable Lead Implant Date: 20151228
Implantable Lead Location: 753859
Implantable Lead Location: 753860
Implantable Lead Model: 292
Implantable Lead Model: 5076
Implantable Lead Serial Number: 358765
Implantable Pulse Generator Implant Date: 20151228
Lead Channel Impedance Value: 533 Ohm
Lead Channel Impedance Value: 544 Ohm
Lead Channel Pacing Threshold Amplitude: 0.7 V
Lead Channel Pacing Threshold Amplitude: 1.1 V
Lead Channel Pacing Threshold Pulse Width: 0.4 ms
Lead Channel Pacing Threshold Pulse Width: 0.4 ms
Lead Channel Sensing Intrinsic Amplitude: 15 mV
Lead Channel Sensing Intrinsic Amplitude: 3.5 mV
Lead Channel Setting Pacing Amplitude: 2 V
Lead Channel Setting Pacing Amplitude: 2.4 V
Lead Channel Setting Pacing Pulse Width: 0.4 ms
Lead Channel Setting Sensing Sensitivity: 0.5 mV
Pulse Gen Serial Number: 508420

## 2019-09-22 ENCOUNTER — Encounter: Payer: Self-pay | Admitting: Cardiology

## 2019-09-22 ENCOUNTER — Other Ambulatory Visit: Payer: Self-pay

## 2019-09-22 ENCOUNTER — Ambulatory Visit (INDEPENDENT_AMBULATORY_CARE_PROVIDER_SITE_OTHER): Payer: 59 | Admitting: Cardiology

## 2019-09-22 VITALS — BP 108/64 | HR 77 | Ht 61.0 in | Wt 333.4 lb

## 2019-09-22 DIAGNOSIS — Z86711 Personal history of pulmonary embolism: Secondary | ICD-10-CM | POA: Diagnosis not present

## 2019-09-22 DIAGNOSIS — I469 Cardiac arrest, cause unspecified: Secondary | ICD-10-CM

## 2019-09-22 DIAGNOSIS — R0600 Dyspnea, unspecified: Secondary | ICD-10-CM

## 2019-09-22 DIAGNOSIS — Z95810 Presence of automatic (implantable) cardiac defibrillator: Secondary | ICD-10-CM

## 2019-09-22 DIAGNOSIS — R0609 Other forms of dyspnea: Secondary | ICD-10-CM

## 2019-09-22 DIAGNOSIS — Z952 Presence of prosthetic heart valve: Secondary | ICD-10-CM | POA: Diagnosis not present

## 2019-09-22 DIAGNOSIS — Z8674 Personal history of sudden cardiac arrest: Secondary | ICD-10-CM

## 2019-09-22 NOTE — Progress Notes (Signed)
Cardiology Office Note:    Date:  09/22/2019   ID:  Heather Mosley, DOB 08/30/1966, MRN [id number]  PCP:  Mateo Flow, MD  Cardiologist:  Jenne Campus, MD    Referring MD: Mateo Flow, MD   Chief Complaint  Patient presents with  . Follow-up  Doing well  History of Present Illness:    Heather HARMISON is a 53 y.o. female with complex past medical history which include aortic root replacement initially with bioprosthesis after data TAVR implanted that failed eventually she ended up getting mechanical Saint Jude valve 21 mm done on July 24, 2017 at [facility].  Also during The process of all this problem she had cardiac arrest, pulmonary emboli required ICD.  Now comes to my office for follow-up overall doing well denies having any chest pain, tightness, pressure, burning the chest she is struggling all her life with weight problem she is morbidly obese which obesity make her life somewhat more difficult.  Overall seems to be happy from cardiac standpoint of view.  Described to have some palpitations that being interrogated by her device.  Plenty some dosages of medication has been adjusted by EP team.  Past Medical History:  Diagnosis Date  . AICD (automatic cardioverter/defibrillator) present 2015  . Aortic valvular stenoses    "born w/it"  . Arthritis    "knees" (06/07/2017)  . Congenital heart defect   . Heart murmur   . High cholesterol   . Hypertension   . OSA on CPAP   . Pulmonary embolism ([place]) 2015    Past Surgical History:  Procedure Laterality Date  . ANOMALOUS PULMONARY VENOUS RETURN REPAIR, TOTAL  2015  . AORTIC VALVE REPAIR  1985  . AORTIC VALVE REPLACEMENT  2006   aortic valve/aortic root replacement with porcine valve, and extending aorta and hemi-arch replacement /notes 06/07/2017  . AORTIC VALVE REPLACEMENT    . [place]; 2006; 2015   "prior to my ORs those years"  . CARDIAC DEFIBRILLATOR PLACEMENT  2015  . CARDIAC VALVE REPLACEMENT     . CESAREAN SECTION  2008  . LEFT HEART CATH AND CORONARY ANGIOGRAPHY N/A 06/08/2017   Procedure: Left Heart Cath and Coronary Angiography;  Surgeon: Belva Crome, MD;  Location: [facility];  Service: Cardiovascular;  Laterality: N/A;    Current Medications: Current Meds  Medication Sig  . acetaminophen (TYLENOL) 325 MG tablet Take 650 mg by mouth 2 (two) times daily as needed for moderate pain or headache.   Marland Kitchen aspirin EC 81 MG tablet Take 81 mg by mouth daily.  Marland Kitchen atorvastatin (LIPITOR) 80 MG tablet TAKE ONE (1) TABLET BY MOUTH EVERY DAY  . diclofenac sodium (VOLTAREN) 1 % GEL Apply topically as needed.  . furosemide (LASIX) 20 MG tablet TAKE 2 TABLETS BY MOUTH DAILY (Patient taking differently: Take 40 mg by mouth as needed. )  . Glucosamine 500 MG CAPS Take 2 capsules by mouth daily.  . metoprolol tartrate (LOPRESSOR) 50 MG tablet Take 1 tablet (50 mg total) by mouth 2 (two) times daily.  Marland Kitchen mexiletine (MEXITIL) 150 MG capsule TAKE 1 CAPSULE(150 MG) BY MOUTH TWICE DAILY  . nitroGLYCERIN (NITROSTAT) 0.4 MG SL tablet Place 1 tablet (0.4 mg total) under the tongue every 5 (five) minutes x 3 doses as needed for chest pain.  . potassium chloride (K-DUR) 10 MEQ tablet Take 1 tablet (10 mEq total) by mouth daily. (Patient taking differently: Take 10 mEq by mouth as needed. )  .  warfarin (COUMADIN) 5 MG tablet TAKE AS DIRECTED BY COUMADIN CLINIC.     Allergies:   Tape   Social History   Socioeconomic History  . Marital status: Married    Spouse name: Not on file  . Number of children: Not on file  . Years of education: Not on file  . Highest education level: Not on file  Occupational History  . Not on file  Social Needs  . Financial resource strain: Not on file  . Food insecurity    Worry: Not on file    Inability: Not on file  . Transportation needs    Medical: Not on file    Non-medical: Not on file  Tobacco Use  . Smoking status: Never Smoker  . Smokeless tobacco:  Never Used  Substance and Sexual Activity  . Alcohol use: No  . Drug use: No  . Sexual activity: Yes  Lifestyle  . Physical activity    Days per week: Not on file    Minutes per session: Not on file  . Stress: Not on file  Relationships  . Social Herbalist on phone: Not on file    Gets together: Not on file    Attends religious service: Not on file    Active member of club or organization: Not on file    Attends meetings of clubs or organizations: Not on file    Relationship status: Not on file  Other Topics Concern  . Not on file  Social History Narrative  . Not on file     Family History: The patient's family history includes Hyperlipidemia in her father; Hypertension in her mother; Leukemia in her father. ROS:   Please see the history of present illness.    All 14 point review of systems negative except as described per history of present illness  EKGs/Labs/Other Studies Reviewed:      Recent Labs: 12/31/2018: BUN 12; Creatinine, Ser 0.70; Potassium 4.4; Sodium 143  Recent Lipid Panel    Component Value Date/Time   CHOL 155 12/31/2018 0856   TRIG 184 (H) 12/31/2018 0856   HDL 45 12/31/2018 0856   CHOLHDL 3.4 12/31/2018 0856   LDLCALC 73 12/31/2018 0856    Physical Exam:    VS:  BP 108/64   Pulse 77   Ht 5\' 1"  (1.549 m)   Wt (!) 333 lb 6.4 oz (151.2 kg)   SpO2 97%   BMI 63.00 kg/m     Wt Readings from Last 3 Encounters:  09/22/19 (!) 333 lb 6.4 oz (151.2 kg)  07/14/19 (!) 323 lb (146.5 kg)  04/17/19 (!) 308 lb (139.7 kg)     GEN:  Well nourished, well developed in no acute distress HEENT: Normal NECK: No JVD; No carotid bruits LYMPHATICS: No lymphadenopathy CARDIAC: RRR, mechanical valve sounds are crisp's., no rubs, no gallops RESPIRATORY:  Clear to auscultation without rales, wheezing or rhonchi  ABDOMEN: Soft, non-tender, non-distended MUSCULOSKELETAL:  No edema; No deformity  SKIN: Warm and dry LOWER EXTREMITIES: no swelling  NEUROLOGIC:  Alert and oriented x 3 PSYCHIATRIC:  Normal affect   ASSESSMENT:    1. S/P ICD (internal cardiac defibrillator) procedure   2. Cardiac arrest ([place])   3. Status post aortic valve replacement Saint Jude valve 21 mm done in July 24, 2017 at [facility]   4. History of pulmonary embolism   5. History of cardiac arrest   6. Dyspnea on exertion    PLAN:    In  order of problems listed above:  1. Status post ICD normal function interrogation reviewed 2. History of cardiac arrest.  Doing well from that point review only one ATP therapy noted on the device. 3. Status post aortic valve replacement with mechanical valve.  We will schedule her to have echocardiogram to assess the valve 4. History of pulmonary embolus stable, anticoagulated which will continue. 5. Dyspnea on exertion again echocardiogram will be done. 6. Dyslipidemia we will ask her to have fasting lipid profile done. 7. Status post aortic root repair.  We will schedule her to have a CT of her chest.   Medication Adjustments/Labs and Tests Ordered: Current medicines are reviewed at length with the patient today.  Concerns regarding medicines are outlined above.  No orders of the defined types were placed in this encounter.  Medication changes: No orders of the defined types were placed in this encounter.   Signed, Park Liter, MD, [facility] 09/22/2019 9:45 AM    [place]

## 2019-09-22 NOTE — Patient Instructions (Signed)
Medication Instructions:  Your physician recommends that you continue on your current medications as directed. Please refer to the Current Medication list given to you today.  *If you need a refill on your cardiac medications before your next appointment, please call your pharmacy*  Lab Work: Your physician recommends that you return for lab work when you come for echo FASTING: lipids   And  3-7 days before ct: BMP   If you have labs (blood work) drawn today and your tests are completely normal, you will receive your results only by: Marland Kitchen MyChart Message (if you have MyChart) OR . A paper copy in the mail If you have any lab test that is abnormal or we need to change your treatment, we will call you to review the results.  Testing/Procedures: Your physician has requested that you have an echocardiogram. Echocardiography is a painless test that uses sound waves to create images of your heart. It provides your doctor with information about the size and shape of your heart and how well your heart's chambers and valves are working. This procedure takes approximately one hour. There are no restrictions for this procedure.  Non-Cardiac CT scanning, (CAT scanning), is a noninvasive, special x-ray that produces cross-sectional images of the body using x-rays and a computer. CT scans help physicians diagnose and treat medical conditions. For some CT exams, a contrast material is used to enhance visibility in the area of the body being studied. CT scans provide greater clarity and reveal more details than regular x-ray exams.    Follow-Up: At [facility], you and your health needs are our priority.  As part of our continuing mission to provide you with exceptional heart care, we have created designated Provider Care Teams.  These Care Teams include your primary Cardiologist (physician) and Advanced Practice Providers (APPs -  Physician Assistants and Nurse Practitioners) who all work together to provide  you with the care you need, when you need it.  Your next appointment:   6 months  The format for your next appointment:   In Person  Provider:   Jenne Campus, MD  Other Instructions    Echocardiogram An echocardiogram is a procedure that uses painless sound waves (ultrasound) to produce an image of the heart. Images from an echocardiogram can provide important information about:  Signs of coronary artery disease (CAD).  Aneurysm detection. An aneurysm is a weak or damaged part of an artery wall that bulges out from the normal force of blood pumping through the body.  Heart size and shape. Changes in the size or shape of the heart can be associated with certain conditions, including heart failure, aneurysm, and CAD.  Heart muscle function.  Heart valve function.  Signs of a past heart attack.  Fluid buildup around the heart.  Thickening of the heart muscle.  A tumor or infectious growth around the heart valves. Tell a health care provider about:  Any allergies you have.  All medicines you are taking, including vitamins, herbs, eye drops, creams, and over-the-counter medicines.  Any blood disorders you have.  Any surgeries you have had.  Any medical conditions you have.  Whether you are pregnant or may be pregnant. What are the risks? Generally, this is a safe procedure. However, problems may occur, including:  Allergic reaction to dye (contrast) that may be used during the procedure. What happens before the procedure? No specific preparation is needed. You may eat and drink normally. What happens during the procedure?   An IV  tube may be inserted into one of your veins.  You may receive contrast through this tube. A contrast is an injection that improves the quality of the pictures from your heart.  A gel will be applied to your chest.  A wand-like tool (transducer) will be moved over your chest. The gel will help to transmit the sound waves from the  transducer.  The sound waves will harmlessly bounce off of your heart to allow the heart images to be captured in real-time motion. The images will be recorded on a computer. The procedure may vary among health care providers and hospitals. What happens after the procedure?  You may return to your normal, everyday life, including diet, activities, and medicines, unless your health care provider tells you not to do that. Summary  An echocardiogram is a procedure that uses painless sound waves (ultrasound) to produce an image of the heart.  Images from an echocardiogram can provide important information about the size and shape of your heart, heart muscle function, heart valve function, and fluid buildup around your heart.  You do not need to do anything to prepare before this procedure. You may eat and drink normally.  After the echocardiogram is completed, you may return to your normal, everyday life, unless your health care provider tells you not to do that. This information is not intended to replace advice given to you by your health care provider. Make sure you discuss any questions you have with your health care provider. Document Released: 10/27/2000 Document Revised: 02/20/2019 Document Reviewed: 12/02/2016 Elsevier Patient Education  2020 [employer].

## 2019-09-25 NOTE — Addendum Note (Signed)
Addended by: Ashok Norris on: 09/25/2019 02:52 PM   Modules accepted: Orders

## 2019-10-07 ENCOUNTER — Other Ambulatory Visit: Payer: Self-pay | Admitting: Cardiology

## 2019-10-14 ENCOUNTER — Ambulatory Visit (INDEPENDENT_AMBULATORY_CARE_PROVIDER_SITE_OTHER): Payer: 59 | Admitting: Pharmacist Clinician (PhC)/ Clinical Pharmacy Specialist

## 2019-10-14 ENCOUNTER — Other Ambulatory Visit: Payer: Self-pay

## 2019-10-14 DIAGNOSIS — Z86711 Personal history of pulmonary embolism: Secondary | ICD-10-CM

## 2019-10-14 DIAGNOSIS — Z952 Presence of prosthetic heart valve: Secondary | ICD-10-CM | POA: Diagnosis not present

## 2019-10-14 DIAGNOSIS — Z5181 Encounter for therapeutic drug level monitoring: Secondary | ICD-10-CM | POA: Diagnosis not present

## 2019-10-14 LAB — POCT INR: INR: 2.6 (ref 2.0–3.0)

## 2019-10-28 ENCOUNTER — Ambulatory Visit (HOSPITAL_BASED_OUTPATIENT_CLINIC_OR_DEPARTMENT_OTHER): Payer: 59

## 2019-11-04 ENCOUNTER — Ambulatory Visit (HOSPITAL_BASED_OUTPATIENT_CLINIC_OR_DEPARTMENT_OTHER)
Admission: RE | Admit: 2019-11-04 | Discharge: 2019-11-04 | Disposition: A | Discharge status: Home / Self Care | Payer: 59 | Source: Ambulatory Visit | Attending: Cardiology | Admitting: Cardiology

## 2019-11-04 ENCOUNTER — Other Ambulatory Visit: Payer: Self-pay

## 2019-11-04 DIAGNOSIS — Z952 Presence of prosthetic heart valve: Secondary | ICD-10-CM | POA: Diagnosis present

## 2019-11-04 DIAGNOSIS — R0600 Dyspnea, unspecified: Secondary | ICD-10-CM | POA: Diagnosis present

## 2019-11-04 MED ORDER — IOHEXOL 350 MG/ML SOLN
100.0000 mL | Freq: Once | INTRAVENOUS | Status: AC | PRN
  Administered 2019-11-04: 100 mL via INTRAVENOUS

## 2019-11-12 ENCOUNTER — Other Ambulatory Visit: Payer: 59

## 2019-11-12 ENCOUNTER — Other Ambulatory Visit: Payer: Self-pay

## 2019-11-12 ENCOUNTER — Ambulatory Visit (INDEPENDENT_AMBULATORY_CARE_PROVIDER_SITE_OTHER): Payer: 59

## 2019-11-12 DIAGNOSIS — Z952 Presence of prosthetic heart valve: Secondary | ICD-10-CM

## 2019-11-12 DIAGNOSIS — R0600 Dyspnea, unspecified: Secondary | ICD-10-CM | POA: Diagnosis not present

## 2019-11-12 NOTE — Progress Notes (Signed)
Complete echocardiogram has been performed.  Jimmy Jehan Ranganathan RDCS, RVT 

## 2019-11-13 LAB — BASIC METABOLIC PANEL
BUN/Creatinine Ratio: 17 (ref 9–23)
BUN: 16 mg/dL (ref 6–24)
CO2: 22 mmol/L (ref 20–29)
Calcium: 9.5 mg/dL (ref 8.7–10.2)
Chloride: 104 mmol/L (ref 96–106)
Creatinine, Ser: 0.93 mg/dL (ref 0.57–1.00)
GFR calc Af Amer: 82 mL/min/{1.73_m2} (ref >59)
GFR calc non Af Amer: 71 mL/min/{1.73_m2} (ref >59)
Glucose: 109 mg/dL — ABNORMAL HIGH (ref 65–99)
Potassium: 4.3 mmol/L (ref 3.5–5.2)
Sodium: 143 mmol/L (ref 134–144)

## 2019-11-13 LAB — LIPID PANEL
Chol/HDL Ratio: 3.9 ratio (ref 0.0–4.4)
Cholesterol, Total: 205 mg/dL — ABNORMAL HIGH (ref 100–199)
HDL: 52 mg/dL (ref >39)
LDL Chol Calc (NIH): 100 mg/dL — ABNORMAL HIGH (ref 0–99)
Triglycerides: 313 mg/dL — ABNORMAL HIGH (ref 0–149)
VLDL Cholesterol Cal: 53 mg/dL — ABNORMAL HIGH (ref 5–40)

## 2019-11-20 ENCOUNTER — Other Ambulatory Visit: Payer: Self-pay | Admitting: Cardiology

## 2019-11-20 ENCOUNTER — Telehealth: Payer: Self-pay

## 2019-11-20 DIAGNOSIS — Z5181 Encounter for therapeutic drug level monitoring: Secondary | ICD-10-CM

## 2019-11-20 NOTE — Telephone Encounter (Signed)
Called pt to inform her of her lab results, pt verbalized understanding and agreed to return within 6 weeks for fasting labs.

## 2019-11-25 ENCOUNTER — Ambulatory Visit (INDEPENDENT_AMBULATORY_CARE_PROVIDER_SITE_OTHER): Payer: 59 | Admitting: Pharmacist

## 2019-11-25 ENCOUNTER — Other Ambulatory Visit: Payer: Self-pay

## 2019-11-25 DIAGNOSIS — Z5181 Encounter for therapeutic drug level monitoring: Secondary | ICD-10-CM

## 2019-11-25 DIAGNOSIS — Z86711 Personal history of pulmonary embolism: Secondary | ICD-10-CM

## 2019-11-25 DIAGNOSIS — Z952 Presence of prosthetic heart valve: Secondary | ICD-10-CM

## 2019-11-25 LAB — POCT INR: INR: 2.8 (ref 2.0–3.0)

## 2019-11-25 NOTE — Patient Instructions (Signed)
Description   Continue taking 2 tablets daily except 2.5 tablets on Mondays and Fridays. Recheck INR in 6 weeks at the [place] office. Please call the office with any medication changes or additions [phone].

## 2019-12-04 ENCOUNTER — Ambulatory Visit (INDEPENDENT_AMBULATORY_CARE_PROVIDER_SITE_OTHER): Payer: 59 | Admitting: *Deleted

## 2019-12-04 DIAGNOSIS — I469 Cardiac arrest, cause unspecified: Secondary | ICD-10-CM

## 2019-12-04 LAB — CUP PACEART REMOTE DEVICE CHECK
Battery Remaining Longevity: 114 mo
Battery Remaining Percentage: 100 %
Brady Statistic RA Percent Paced: 55 %
Brady Statistic RV Percent Paced: 1 %
Date Time Interrogation Session: 20210121084300
HighPow Impedance: 84 Ohm
Implantable Lead Implant Date: 20151228
Implantable Lead Implant Date: 20151228
Implantable Lead Location: 753859
Implantable Lead Location: 753860
Implantable Lead Model: 292
Implantable Lead Model: 5076
Implantable Lead Serial Number: 358765
Implantable Pulse Generator Implant Date: 20151228
Lead Channel Impedance Value: 525 Ohm
Lead Channel Impedance Value: 546 Ohm
Lead Channel Pacing Threshold Amplitude: 0.7 V
Lead Channel Pacing Threshold Amplitude: 1.1 V
Lead Channel Pacing Threshold Pulse Width: 0.4 ms
Lead Channel Pacing Threshold Pulse Width: 0.4 ms
Lead Channel Setting Pacing Amplitude: 2 V
Lead Channel Setting Pacing Amplitude: 2.4 V
Lead Channel Setting Pacing Pulse Width: 0.4 ms
Lead Channel Setting Sensing Sensitivity: 0.5 mV
Pulse Gen Serial Number: 508420

## 2019-12-22 ENCOUNTER — Other Ambulatory Visit: Payer: Self-pay | Admitting: Cardiology

## 2019-12-22 DIAGNOSIS — Z952 Presence of prosthetic heart valve: Secondary | ICD-10-CM

## 2019-12-25 ENCOUNTER — Telehealth: Payer: Self-pay | Admitting: Cardiology

## 2019-12-25 DIAGNOSIS — Z952 Presence of prosthetic heart valve: Secondary | ICD-10-CM

## 2019-12-25 MED ORDER — WARFARIN SODIUM 5 MG PO TABS: ORAL_TABLET | ORAL | 0 refills | Status: DC

## 2019-12-25 NOTE — Telephone Encounter (Signed)
*  STAT* If patient is at the pharmacy, call can be transferred to refill team.   1. Which medications need to be refilled? (please list name of each medication and dose if known)  warfarin (COUMADIN) 5 MG tablet  2. Which pharmacy/location (including street and city if local pharmacy) is medication to be sent to? [place], [place].  3. Do they need a 30 day or 90 day supply? 90 day   Patient runs out of medication tomorrow.

## 2019-12-26 ENCOUNTER — Telehealth: Payer: Self-pay | Admitting: *Deleted

## 2019-12-26 NOTE — Telephone Encounter (Signed)
-----   Message from Will Meredith Leeds, MD sent at 12/05/2019 12:03 PM EST ----- Abnormal device interrogation reviewed.  Lead parameters and battery status stable.  VT episodes. Increase mexiletine to 250 mg BID.

## 2019-12-26 NOTE — Telephone Encounter (Signed)
lmtcb

## 2019-12-29 MED ORDER — MEXILETINE HCL 250 MG PO CAPS: 250.0000 mg | ORAL_CAPSULE | Freq: Three times a day (TID) | ORAL | 3 refills | Status: DC

## 2019-12-29 NOTE — Telephone Encounter (Signed)
Informed patient of results and verbal understanding expressed. Rx sent to [facility] per pt request. She will call with any issues post med increase

## 2019-12-29 NOTE — Telephone Encounter (Signed)
The pt returning Heather Mosley phone call. Her phone number is [phone].

## 2020-01-06 ENCOUNTER — Other Ambulatory Visit: Payer: Self-pay

## 2020-01-06 ENCOUNTER — Ambulatory Visit (INDEPENDENT_AMBULATORY_CARE_PROVIDER_SITE_OTHER): Payer: 59 | Admitting: Pharmacist Clinician (PhC)/ Clinical Pharmacy Specialist

## 2020-01-06 DIAGNOSIS — Z952 Presence of prosthetic heart valve: Secondary | ICD-10-CM

## 2020-01-06 DIAGNOSIS — Z5181 Encounter for therapeutic drug level monitoring: Secondary | ICD-10-CM

## 2020-01-06 DIAGNOSIS — Z86711 Personal history of pulmonary embolism: Secondary | ICD-10-CM

## 2020-01-06 LAB — POCT INR: INR: 3.7 — AB (ref 2.0–3.0)

## 2020-01-06 NOTE — Patient Instructions (Signed)
No warfarin today Tuesday Feb 23, then continue taking 2 tablets daily except 2.5 tablets on Mondays and Fridays. Recheck INR in 3 weeks at the [place] office. Please call the office with any medication changes or additions [phone].

## 2020-01-27 ENCOUNTER — Ambulatory Visit (INDEPENDENT_AMBULATORY_CARE_PROVIDER_SITE_OTHER): Payer: 59 | Admitting: *Deleted

## 2020-01-27 ENCOUNTER — Other Ambulatory Visit: Payer: Self-pay

## 2020-01-27 DIAGNOSIS — Z952 Presence of prosthetic heart valve: Secondary | ICD-10-CM

## 2020-01-27 DIAGNOSIS — Z86711 Personal history of pulmonary embolism: Secondary | ICD-10-CM | POA: Diagnosis not present

## 2020-01-27 DIAGNOSIS — Z5181 Encounter for therapeutic drug level monitoring: Secondary | ICD-10-CM

## 2020-01-27 LAB — POCT INR: INR: 3.4 — AB (ref 2.0–3.0)

## 2020-01-27 NOTE — Patient Instructions (Signed)
Take warfarin 1/2 tablet tonight then decrease dose to 2 tablets daily.  Recheck INR in 3 weeks at the [place] office. Please call the office with any medication changes or additions [phone].

## 2020-02-17 ENCOUNTER — Ambulatory Visit (INDEPENDENT_AMBULATORY_CARE_PROVIDER_SITE_OTHER): Payer: 59 | Admitting: *Deleted

## 2020-02-17 ENCOUNTER — Other Ambulatory Visit: Payer: Self-pay

## 2020-02-17 DIAGNOSIS — Z5181 Encounter for therapeutic drug level monitoring: Secondary | ICD-10-CM | POA: Diagnosis not present

## 2020-02-17 DIAGNOSIS — Z86711 Personal history of pulmonary embolism: Secondary | ICD-10-CM

## 2020-02-17 DIAGNOSIS — Z952 Presence of prosthetic heart valve: Secondary | ICD-10-CM

## 2020-02-17 LAB — POCT INR: INR: 2.3 (ref 2.0–3.0)

## 2020-03-04 ENCOUNTER — Ambulatory Visit (INDEPENDENT_AMBULATORY_CARE_PROVIDER_SITE_OTHER): Payer: 59 | Admitting: *Deleted

## 2020-03-04 DIAGNOSIS — I469 Cardiac arrest, cause unspecified: Secondary | ICD-10-CM

## 2020-03-04 LAB — CUP PACEART REMOTE DEVICE CHECK
Battery Remaining Longevity: 114 mo
Battery Remaining Percentage: 100 %
Brady Statistic RA Percent Paced: 54 %
Brady Statistic RV Percent Paced: 0 %
Date Time Interrogation Session: 20210422094300
HighPow Impedance: 85 Ohm
Implantable Lead Implant Date: 20151228
Implantable Lead Implant Date: 20151228
Implantable Lead Location: 753859
Implantable Lead Location: 753860
Implantable Lead Model: 292
Implantable Lead Model: 5076
Implantable Lead Serial Number: 358765
Implantable Pulse Generator Implant Date: 20151228
Lead Channel Impedance Value: 516 Ohm
Lead Channel Impedance Value: 554 Ohm
Lead Channel Pacing Threshold Amplitude: 0.7 V
Lead Channel Pacing Threshold Amplitude: 1.1 V
Lead Channel Pacing Threshold Pulse Width: 0.4 ms
Lead Channel Pacing Threshold Pulse Width: 0.4 ms
Lead Channel Setting Pacing Amplitude: 2 V
Lead Channel Setting Pacing Amplitude: 2.4 V
Lead Channel Setting Pacing Pulse Width: 0.4 ms
Lead Channel Setting Sensing Sensitivity: 0.5 mV
Pulse Gen Serial Number: 508420

## 2020-03-05 NOTE — Progress Notes (Signed)
ICD Remote  

## 2020-03-16 ENCOUNTER — Ambulatory Visit (INDEPENDENT_AMBULATORY_CARE_PROVIDER_SITE_OTHER): Payer: 59 | Admitting: Pharmacist

## 2020-03-16 ENCOUNTER — Other Ambulatory Visit: Payer: Self-pay

## 2020-03-16 DIAGNOSIS — Z5181 Encounter for therapeutic drug level monitoring: Secondary | ICD-10-CM

## 2020-03-16 DIAGNOSIS — Z952 Presence of prosthetic heart valve: Secondary | ICD-10-CM

## 2020-03-16 DIAGNOSIS — Z86711 Personal history of pulmonary embolism: Secondary | ICD-10-CM

## 2020-03-16 LAB — POCT INR: INR: 2.2 (ref 2.0–3.0)

## 2020-03-16 NOTE — Patient Instructions (Addendum)
Continue warfain 2 tablets daily.  Recheck INR in 4 weeks at the [place] office. Please call the office with any medication changes or additions [phone].

## 2020-04-11 ENCOUNTER — Other Ambulatory Visit: Payer: Self-pay | Admitting: Cardiology

## 2020-04-13 ENCOUNTER — Other Ambulatory Visit: Payer: Self-pay

## 2020-04-13 ENCOUNTER — Ambulatory Visit (INDEPENDENT_AMBULATORY_CARE_PROVIDER_SITE_OTHER): Payer: 59 | Admitting: Pharmacist

## 2020-04-13 DIAGNOSIS — Z5181 Encounter for therapeutic drug level monitoring: Secondary | ICD-10-CM | POA: Diagnosis not present

## 2020-04-13 DIAGNOSIS — Z86711 Personal history of pulmonary embolism: Secondary | ICD-10-CM

## 2020-04-13 DIAGNOSIS — Z952 Presence of prosthetic heart valve: Secondary | ICD-10-CM | POA: Diagnosis not present

## 2020-04-13 LAB — POCT INR: INR: 2.6 (ref 2.0–3.0)

## 2020-04-13 NOTE — Patient Instructions (Signed)
Continue warfain 2 tablets daily.  Recheck INR in 5 weeks at the [place] office. Please call the office with any medication changes or additions [phone].

## 2020-05-12 ENCOUNTER — Other Ambulatory Visit: Payer: Self-pay | Admitting: Cardiology

## 2020-05-18 ENCOUNTER — Other Ambulatory Visit: Payer: Self-pay | Admitting: Cardiology

## 2020-05-25 ENCOUNTER — Ambulatory Visit (INDEPENDENT_AMBULATORY_CARE_PROVIDER_SITE_OTHER): Payer: 59 | Admitting: Pharmacist Clinician (PhC)/ Clinical Pharmacy Specialist

## 2020-05-25 ENCOUNTER — Other Ambulatory Visit: Payer: Self-pay

## 2020-05-25 DIAGNOSIS — Z5181 Encounter for therapeutic drug level monitoring: Secondary | ICD-10-CM | POA: Diagnosis not present

## 2020-05-25 DIAGNOSIS — Z86711 Personal history of pulmonary embolism: Secondary | ICD-10-CM

## 2020-05-25 DIAGNOSIS — Z952 Presence of prosthetic heart valve: Secondary | ICD-10-CM

## 2020-05-25 LAB — POCT INR: INR: 2.1 (ref 2.0–3.0)

## 2020-05-25 NOTE — Patient Instructions (Signed)
Continue warfain 2 tablets daily.  Recheck INR in 6 weeks at the [place] office. Please call the office with any medication changes or additions [phone].   

## 2020-06-03 ENCOUNTER — Ambulatory Visit (INDEPENDENT_AMBULATORY_CARE_PROVIDER_SITE_OTHER): Payer: 59 | Admitting: *Deleted

## 2020-06-03 DIAGNOSIS — I472 Ventricular tachycardia, unspecified: Secondary | ICD-10-CM

## 2020-06-03 LAB — CUP PACEART REMOTE DEVICE CHECK
Battery Remaining Longevity: 108 mo
Battery Remaining Percentage: 100 %
Brady Statistic RA Percent Paced: 52 %
Brady Statistic RV Percent Paced: 0 %
Date Time Interrogation Session: 20210722040500
HighPow Impedance: 89 Ohm
Implantable Lead Implant Date: 20151228
Implantable Lead Implant Date: 20151228
Implantable Lead Location: 753859
Implantable Lead Location: 753860
Implantable Lead Model: 292
Implantable Lead Model: 5076
Implantable Lead Serial Number: 358765
Implantable Pulse Generator Implant Date: 20151228
Lead Channel Impedance Value: 470 Ohm
Lead Channel Impedance Value: 667 Ohm
Lead Channel Pacing Threshold Amplitude: 0.7 V
Lead Channel Pacing Threshold Amplitude: 1.1 V
Lead Channel Pacing Threshold Pulse Width: 0.4 ms
Lead Channel Pacing Threshold Pulse Width: 0.4 ms
Lead Channel Setting Pacing Amplitude: 2 V
Lead Channel Setting Pacing Amplitude: 2.4 V
Lead Channel Setting Pacing Pulse Width: 0.4 ms
Lead Channel Setting Sensing Sensitivity: 0.5 mV
Pulse Gen Serial Number: 508420

## 2020-06-04 NOTE — Progress Notes (Signed)
Remote ICD transmission.   

## 2020-07-06 ENCOUNTER — Other Ambulatory Visit: Payer: Self-pay

## 2020-07-06 ENCOUNTER — Ambulatory Visit (INDEPENDENT_AMBULATORY_CARE_PROVIDER_SITE_OTHER): Payer: 59 | Admitting: Pharmacist

## 2020-07-06 DIAGNOSIS — Z952 Presence of prosthetic heart valve: Secondary | ICD-10-CM

## 2020-07-06 DIAGNOSIS — Z5181 Encounter for therapeutic drug level monitoring: Secondary | ICD-10-CM

## 2020-07-06 DIAGNOSIS — Z86711 Personal history of pulmonary embolism: Secondary | ICD-10-CM | POA: Diagnosis not present

## 2020-07-06 LAB — POCT INR: INR: 2.1 (ref 2.0–3.0)

## 2020-07-06 NOTE — Patient Instructions (Signed)
Continue warfain 2 tablets daily.  Recheck INR in 6 weeks at the [place] office. Please call the office with any medication changes or additions [phone].   

## 2020-07-12 ENCOUNTER — Other Ambulatory Visit: Payer: Self-pay | Admitting: Cardiology

## 2020-07-15 ENCOUNTER — Other Ambulatory Visit: Payer: Self-pay | Admitting: Cardiology

## 2020-07-26 ENCOUNTER — Other Ambulatory Visit: Payer: Self-pay | Admitting: Cardiology

## 2020-08-13 ENCOUNTER — Other Ambulatory Visit: Payer: Self-pay | Admitting: Cardiology

## 2020-08-13 DIAGNOSIS — I4891 Unspecified atrial fibrillation: Secondary | ICD-10-CM

## 2020-08-16 ENCOUNTER — Telehealth: Payer: Self-pay | Admitting: Emergency Medicine

## 2020-08-16 NOTE — Telephone Encounter (Signed)
Patient notified she will receive Toprol XL refill for 2 months and no further refills will be provided until after follow-up appointment with Dr Curt Bears. She will expect a call from a scheduler to make an appointment in [place] with Dr. Curt Bears.

## 2020-08-16 NOTE — Telephone Encounter (Signed)
Spoke to pt. Aware she will need to follow up w/ Dr. Curt Bears next available as she is overdue. Pt understands office will call to arrange November office visit in [place]. Aware will not refill Lopressor again until seen in office. Pt is agreeable to plan.

## 2020-08-16 NOTE — Telephone Encounter (Signed)
°*  STAT* If patient is at the pharmacy, call can be transferred to refill team.   1. Which medications need to be refilled? (please list name of each medication and dose if known)metoprolol tartrate (LOPRESSOR) 50 MG tablet [Pharmacy Med Name: METOPROLOL TARTRATE 50 MG TAB] 2. Which pharmacy/location (including street and city if local pharmacy) is medication to be sent to?  [place], [address] [place] [address]  Phone:  [phone] Fax:  [phone]   3. Do they need a 30 day or 90 day supply? 30  Pt called in and stated she is completely out of this med and would like to know the status of it.    Best number [phone]

## 2020-08-20 ENCOUNTER — Other Ambulatory Visit: Payer: Self-pay | Admitting: Cardiology

## 2020-08-23 ENCOUNTER — Ambulatory Visit (INDEPENDENT_AMBULATORY_CARE_PROVIDER_SITE_OTHER): Payer: 59 | Admitting: Cardiology

## 2020-08-23 ENCOUNTER — Other Ambulatory Visit: Payer: Self-pay

## 2020-08-23 ENCOUNTER — Encounter: Payer: Self-pay | Admitting: Cardiology

## 2020-08-23 DIAGNOSIS — Z8674 Personal history of sudden cardiac arrest: Secondary | ICD-10-CM | POA: Diagnosis not present

## 2020-08-23 DIAGNOSIS — I4891 Unspecified atrial fibrillation: Secondary | ICD-10-CM | POA: Diagnosis not present

## 2020-08-23 DIAGNOSIS — Z95810 Presence of automatic (implantable) cardiac defibrillator: Secondary | ICD-10-CM

## 2020-08-23 MED ORDER — METOPROLOL TARTRATE 50 MG PO TABS: 50.0000 mg | ORAL_TABLET | Freq: Two times a day (BID) | ORAL | 2 refills | Status: DC

## 2020-08-23 MED ORDER — MEXILETINE HCL 250 MG PO CAPS: 250.0000 mg | ORAL_CAPSULE | Freq: Two times a day (BID) | ORAL | 1 refills | Status: DC

## 2020-08-23 NOTE — Patient Instructions (Signed)
Medication Instructions:  Your physician recommends that you continue on your current medications as directed. Please refer to the Current Medication list given to you today.  *If you need a refill on your cardiac medications before your next appointment, please call your pharmacy*   Lab Work: None ordered   Testing/Procedures: None ordered   Follow-Up: At [facility], you and your health needs are our priority.  As part of our continuing mission to provide you with exceptional heart care, we have created designated Provider Care Teams.  These Care Teams include your primary Cardiologist (physician) and Advanced Practice Providers (APPs -  Physician Assistants and Nurse Practitioners) who all work together to provide you with the care you need, when you need it.  Remote monitoring is used to monitor your Pacemaker or ICD from home. This monitoring reduces the number of office visits required to check your device to one time per year. It allows [place] to keep an eye on the functioning of your device to ensure it is working properly. You are scheduled for a device check from home on 09/02/2020. You may send your transmission at any time that day. If you have a wireless device, the transmission will be sent automatically. After your physician reviews your transmission, you will receive a postcard with your next transmission date.  Your next appointment:   6 month(s)  The format for your next appointment:   In Person  Provider:   Allegra Lai, MD   Thank you for choosing [facility]!!   Trinidad Curet, RN [phone]

## 2020-08-23 NOTE — Progress Notes (Signed)
Electrophysiology Office Note   Date:  08/23/2020   ID:  Heather Mosley, DOB 03/17/1966, MRN [id number]  PCP:  Mateo Flow, MD  Cardiologist:  Agustin Cree Primary Electrophysiologist:  Tiffinie Caillier Meredith Leeds, MD    No chief complaint on file.    History of Present Illness: Heather Mosley is a 54 y.o. female who is being seen today for the evaluation of ventricular tachycardia, ICD at the request of [facility]. Presenting today for electrophysiology evaluation.    She has a history of aortic valve/aortic root replacement with a porcine valve and extended aortic and hemiarch replacement in 2006, hypertension, and obesity.  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  Shortly afterwards she developed a PEA and cardiac arrest secondary to VT.  She had an ICD implanted at [facility] and was placed on Xarelto.  She did have recurrent ventricular tachycardia and was started on mexiletine.  Today, denies symptoms of palpitations, chest pain, shortness of breath, orthopnea, PND, lower extremity edema, claudication, dizziness, presyncope, syncope, bleeding, or neurologic sequela. The patient is tolerating medications without difficulties.  Since last being seen she has done well.  She has no chest pain or shortness of breath.  She is able to do all of her daily activities without restriction.  She has had some nonsustained ventricular tachycardia, though she is unaware of palpitations.  Past Medical History:  Diagnosis Date   AICD (automatic cardioverter/defibrillator) present 2015   Aortic valvular stenoses    "born w/it"   Arthritis    "knees" (06/07/2017)   Congenital heart defect    Heart murmur    High cholesterol    Hypertension    OSA on CPAP    Pulmonary embolism ([place]) 2015   Past Surgical History:  Procedure Laterality Date   ANOMALOUS PULMONARY VENOUS RETURN REPAIR, TOTAL  2015   AORTIC VALVE  REPAIR  1985   AORTIC VALVE REPLACEMENT  2006   aortic valve/aortic root replacement with porcine valve, and extending aorta and hemi-arch replacement /notes 06/07/2017   [place]; 2006; 2015   "prior to my ORs those years"   [place]  2015   CARDIAC VALVE REPLACEMENT     CESAREAN SECTION  2008   LEFT HEART CATH AND CORONARY ANGIOGRAPHY N/A 06/08/2017   Procedure: Left Heart Cath and Coronary Angiography;  Surgeon: Belva Crome, MD;  Location: [facility];  Service: Cardiovascular;  Laterality: N/A;     Current Outpatient Medications  Medication Sig Dispense Refill   acetaminophen (TYLENOL) 325 MG tablet Take 650 mg by mouth 2 (two) times daily as needed for moderate pain or headache.      amoxicillin (AMOXIL) 500 MG capsule Take 4 capsules by mouth as needed. 1 HOUR PRIOR TO DENTAL APPOINTMENT     aspirin EC 81 MG tablet Take 81 mg by mouth daily.     atorvastatin (LIPITOR) 80 MG tablet TAKE ONE (1) TABLET BY MOUTH EVERY DAY 30 tablet 0   diclofenac sodium (VOLTAREN) 1 % GEL Apply topically as needed.     furosemide (LASIX) 20 MG tablet TAKE 2 TABLETS BY MOUTH ONCE DAILY. 60 tablet 5   Glucosamine 500 MG CAPS Take 2 capsules by mouth daily.     metoprolol tartrate (LOPRESSOR) 50 MG tablet Take 1 tablet (50 mg total) by mouth 2 (two) times daily. 180 tablet 2  mexiletine (MEXITIL) 250 MG capsule Take 1 capsule (250 mg total) by mouth 2 (two) times daily. 180 capsule 1   nitroGLYCERIN (NITROSTAT) 0.4 MG SL tablet Place 1 tablet (0.4 mg total) under the tongue every 5 (five) minutes x 3 doses as needed for chest pain. 25 tablet 11   potassium chloride (KLOR-CON) 10 MEQ tablet Take 1 tablet (10 mEq total) by mouth as needed. 90 tablet 1   warfarin (COUMADIN) 5 MG tablet Take 2 to 2.5 tablets by mouth daily as directed 70 tablet 2   warfarin (COUMADIN) 5 MG tablet Take 2 to 2.5 tablets daily   Or as directed by the anticoagulation clinic. 190 tablet 0   warfarin (COUMADIN) 5 MG tablet Take 1 tablet (5 mg total) by mouth 2 (two) times daily. 180 tablet 1   No current facility-administered medications for this visit.    Allergies:   Tape   Social History:  The patient  reports that she has never smoked. She has never used smokeless tobacco. She reports that she does not drink alcohol and does not use drugs.   Family History:  The patient's family history includes Hyperlipidemia in her father; Hypertension in her mother; Leukemia in her father.   ROS:  Please see the history of present illness.   Otherwise, review of systems is positive for none.   All other systems are reviewed and negative.   PHYSICAL EXAM: VS:  BP 116/68    Pulse 75    Ht 5\' 1"  (1.549 m)    Wt (!) 346 lb 12.8 oz (157.3 kg)    SpO2 97%    BMI 65.53 kg/m  , BMI Body mass index is 65.53 kg/m. GEN: Well nourished, well developed, in no acute distress  HEENT: normal  Neck: no JVD, carotid bruits, or masses Cardiac: RRR; no murmurs, rubs, or gallops,no edema  Respiratory:  clear to auscultation bilaterally, normal work of breathing GI: soft, nontender, nondistended, + BS MS: no deformity or atrophy  Skin: warm and dry, device site well healed Neuro:  Strength and sensation are intact Psych: euthymic mood, full affect  EKG:  EKG is ordered today. Personal review of the ekg ordered shows atrial paced, rate 75  Personal review of the device interrogation today. Results in [facility]: 11/12/2019: BUN 16; Creatinine, Ser 0.93; Potassium 4.3; Sodium 143    Lipid Panel     Component Value Date/Time   CHOL 205 (H) 11/12/2019 0814   TRIG 313 (H) 11/12/2019 0814   HDL 52 11/12/2019 0814   CHOLHDL 3.9 11/12/2019 0814   LDLCALC 100 (H) 11/12/2019 0814     Wt Readings from Last 3 Encounters:  08/23/20 (!) 346 lb 12.8 oz (157.3 kg)  09/22/19 (!) 333 lb 6.4 oz (151.2 kg)  07/14/19 (!) 323 lb  (146.5 kg)      Other studies Reviewed: Additional studies/ records that were reviewed today include: TTE 11/01/18  Review of the above records today demonstrates:  - Left ventricle: The cavity size was normal. There was moderate   concentric hypertrophy. Systolic function was normal. The   estimated ejection fraction was in the range of 55% to 65%. Wall   motion was normal; there were no regional wall motion   abnormalities. - Aortic valve: Valve area (VTI): 1.4 cm^2. Valve area (Vmax): 1.37   cm^2. Valve area (Vmean): 1.19 cm^2. - Left atrium: The atrium was mildly dilated.   ASSESSMENT AND PLAN:  1.  VT arrest: Status post [employer] ICD implanted in 2015.  Device functioning appropriately.  Has been put on mexiletine (ECG monitoring for high risk medications) for recurrent VT. she is continued to have short episodes but is asymptomatic.  No changes.  2.  Aortic valve/aortic root replacement: Currently followed by Dr. Agustin Cree.  No changes.    3.  Hypertension: Currently well controlled  4.  Obesity: Diet and exercise encouraged  5.  Obstructive sleep apnea: CPAP compliance encouraged  6.  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  Patient is wanted to hold off on further therapy.  Current medicines are reviewed at length with the patient today.   The patient does not have concerns regarding her medicines.  The following changes were made today: None  Labs/ tests ordered today include:  Orders Placed This Encounter  Procedures   EKG 12-Lead     Disposition:   FU with Catilyn Boggus 6 months  Signed, Zeek Rostron Meredith Leeds, MD  08/23/2020 11:10 AM     [facility] [address] [place]  [address] [phone] (office) [phone] (fax)

## 2020-08-24 ENCOUNTER — Ambulatory Visit (INDEPENDENT_AMBULATORY_CARE_PROVIDER_SITE_OTHER): Payer: 59

## 2020-08-24 DIAGNOSIS — Z86711 Personal history of pulmonary embolism: Secondary | ICD-10-CM

## 2020-08-24 DIAGNOSIS — Z952 Presence of prosthetic heart valve: Secondary | ICD-10-CM

## 2020-08-24 DIAGNOSIS — Z5181 Encounter for therapeutic drug level monitoring: Secondary | ICD-10-CM | POA: Diagnosis not present

## 2020-08-24 LAB — POCT INR: INR: 2.5 (ref 2.0–3.0)

## 2020-08-24 NOTE — Patient Instructions (Signed)
Continue warfain 2 tablets daily.  Recheck INR in 6 weeks at the [place] office. Please call the office with any medication changes or additions [phone].   

## 2020-09-02 ENCOUNTER — Ambulatory Visit (INDEPENDENT_AMBULATORY_CARE_PROVIDER_SITE_OTHER): Payer: 59

## 2020-09-02 DIAGNOSIS — I472 Ventricular tachycardia, unspecified: Secondary | ICD-10-CM

## 2020-09-02 LAB — CUP PACEART REMOTE DEVICE CHECK
Battery Remaining Longevity: 96 mo
Battery Remaining Percentage: 100 %
Brady Statistic RA Percent Paced: 51 %
Brady Statistic RV Percent Paced: 0 %
Date Time Interrogation Session: 20211021030300
HighPow Impedance: 90 Ohm
Implantable Lead Implant Date: 20151228
Implantable Lead Implant Date: 20151228
Implantable Lead Location: 753859
Implantable Lead Location: 753860
Implantable Lead Model: 292
Implantable Lead Model: 5076
Implantable Lead Serial Number: 358765
Implantable Pulse Generator Implant Date: 20151228
Lead Channel Impedance Value: 527 Ohm
Lead Channel Impedance Value: 543 Ohm
Lead Channel Pacing Threshold Amplitude: 0.6 V
Lead Channel Pacing Threshold Amplitude: 0.9 V
Lead Channel Pacing Threshold Pulse Width: 0.4 ms
Lead Channel Pacing Threshold Pulse Width: 0.4 ms
Lead Channel Setting Pacing Amplitude: 2 V
Lead Channel Setting Pacing Amplitude: 2.4 V
Lead Channel Setting Pacing Pulse Width: 0.4 ms
Lead Channel Setting Sensing Sensitivity: 0.5 mV
Pulse Gen Serial Number: 508420

## 2020-09-08 NOTE — Progress Notes (Signed)
Remote ICD transmission.   

## 2020-09-21 ENCOUNTER — Other Ambulatory Visit: Payer: Self-pay | Admitting: Cardiology

## 2020-10-05 ENCOUNTER — Ambulatory Visit (INDEPENDENT_AMBULATORY_CARE_PROVIDER_SITE_OTHER): Payer: 59

## 2020-10-05 ENCOUNTER — Other Ambulatory Visit: Payer: Self-pay

## 2020-10-05 DIAGNOSIS — Z952 Presence of prosthetic heart valve: Secondary | ICD-10-CM | POA: Diagnosis not present

## 2020-10-05 DIAGNOSIS — Z5181 Encounter for therapeutic drug level monitoring: Secondary | ICD-10-CM

## 2020-10-05 DIAGNOSIS — Z86711 Personal history of pulmonary embolism: Secondary | ICD-10-CM | POA: Diagnosis not present

## 2020-10-05 LAB — POCT INR: INR: 2.2 (ref 2.0–3.0)

## 2020-10-05 NOTE — Patient Instructions (Signed)
Continue warfain 2 tablets daily.  Recheck INR in 6 weeks at the [place] office. Please call the office with any medication changes or additions [phone].   

## 2020-10-22 ENCOUNTER — Other Ambulatory Visit: Payer: Self-pay | Admitting: Cardiology

## 2020-11-09 IMAGING — CT CT ANGIO CHEST
2 of 7 series · 17 of 46 positions shown · IV contrast (iopamidol)
Comparison: Prior CTA chest 06/27/2017

CLINICAL DATA: 52-year-old female with a history of nonrheumatic
aortic valvular insufficiency status post multiple aortic valve
replacements most recently mechanical valve prosthesis. Patient has
also had prior aortic root replacement.

EXAM:
CT ANGIOGRAPHY CHEST WITH CONTRAST
TECHNIQUE: Multidetector CT imaging of the chest was performed using the
standard protocol during bolus administration of intravenous
contrast. Multiplanar CT image reconstructions and MIPs were
obtained to evaluate the vascular anatomy.
CONTRAST:  100mL VUMOOO-PMP IOPAMIDOL (VUMOOO-PMP) INJECTION 76%

[Series 7: aorta 3.0 i31f 2 · axial · 0.66mm/px · z∈[+1253,+1511]mm · 14 of 94 slices shown]
[im 4/94  lung]
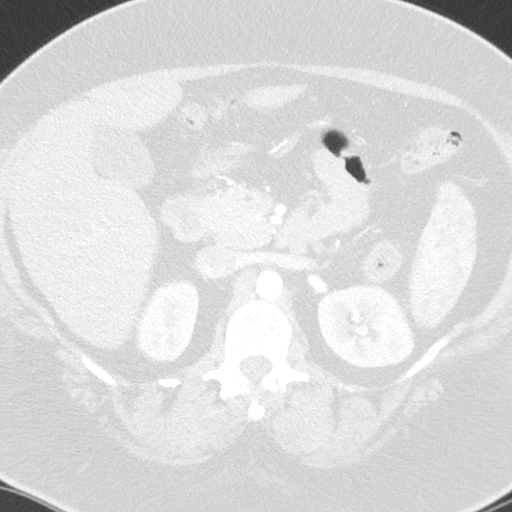
[im 11/94  soft-tissue]
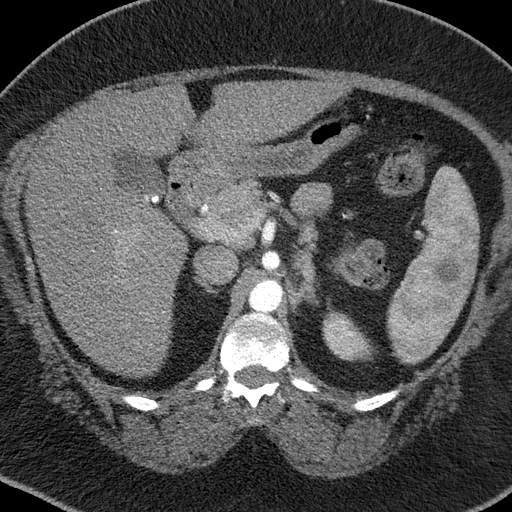
[im 18/94  lung]
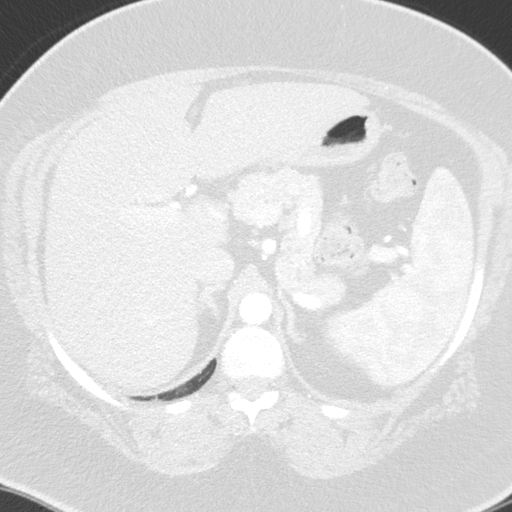
[im 25/94  soft-tissue]
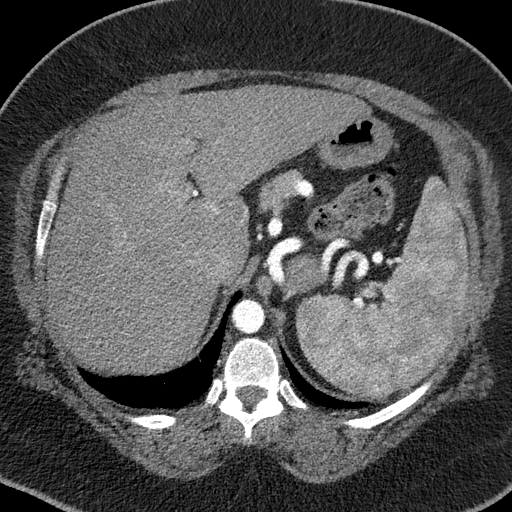
[im 32/94  lung]
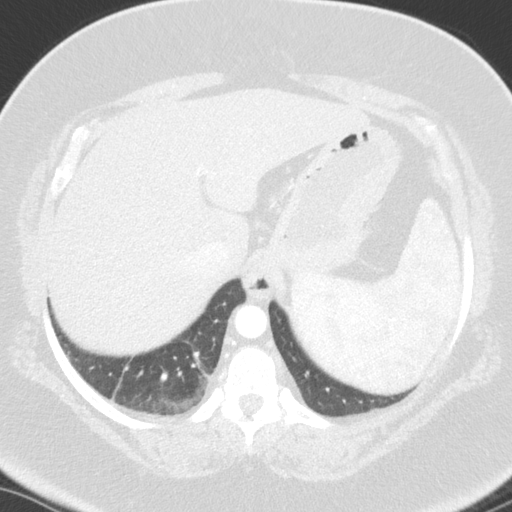
[im 38/94  soft-tissue]
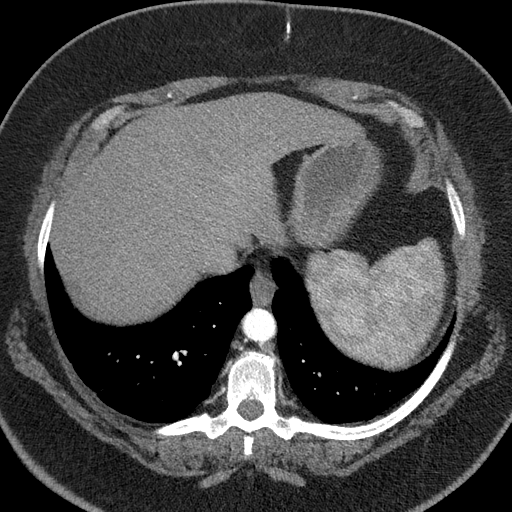
[im 45/94  lung]
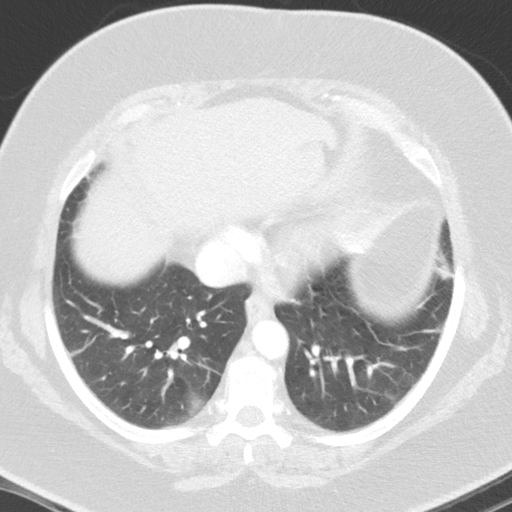
[im 49/94  soft-tissue]
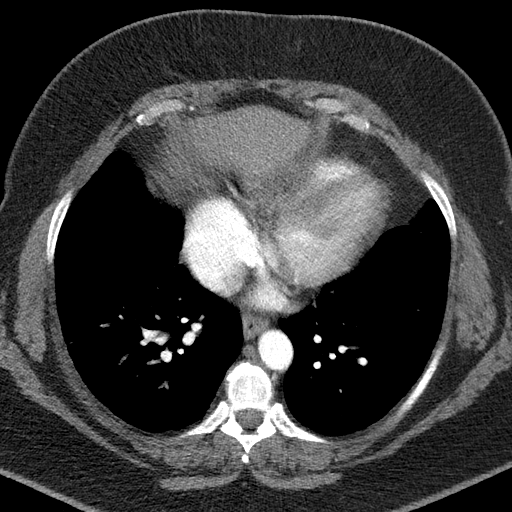
[im 56/94  lung]
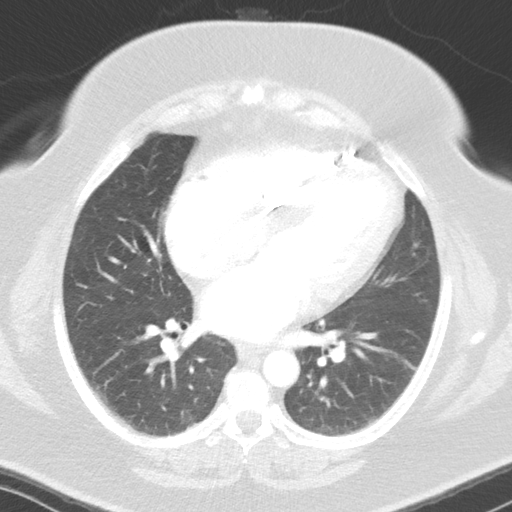
[im 63/94  soft-tissue]
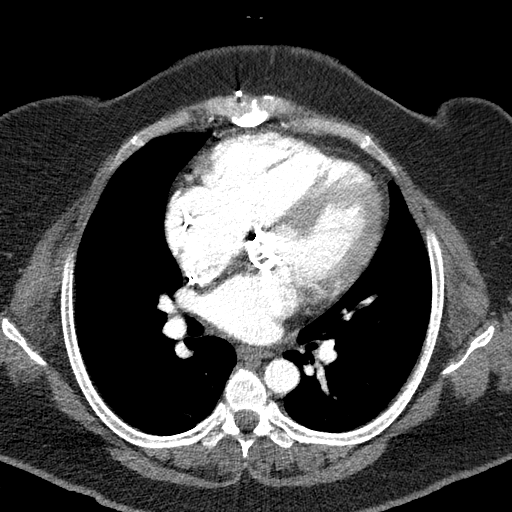
[im 69/94  lung]
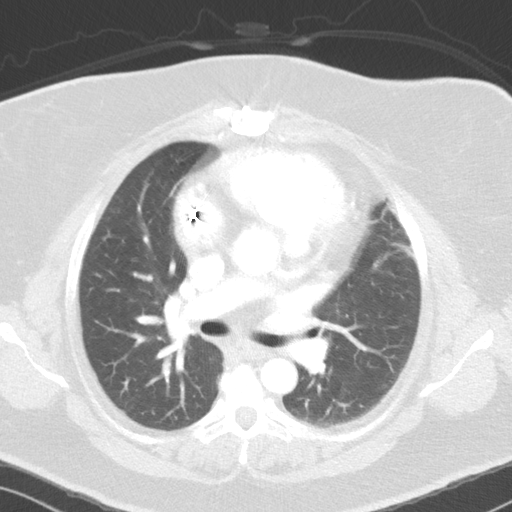
[im 76/94  soft-tissue]
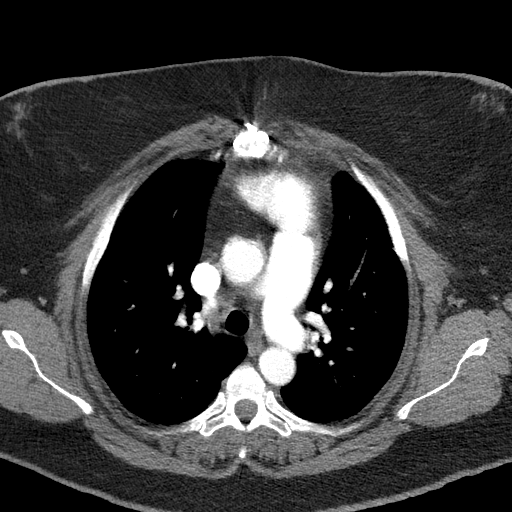
[im 83/94  lung]
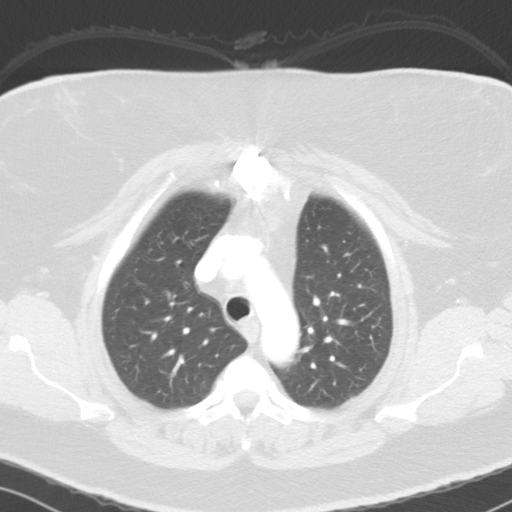
[im 90/94  soft-tissue]
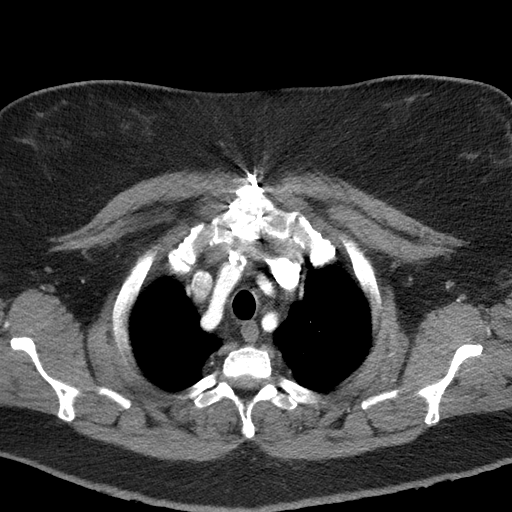

[Series 10: coronals · coronal · 0.58mm/px · 3 of 163 slices shown]
[im 41/163  soft-tissue]
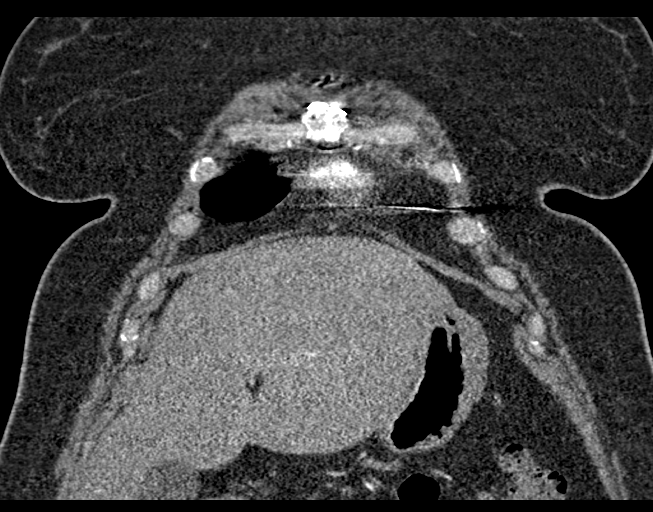
[im 82/163  soft-tissue]
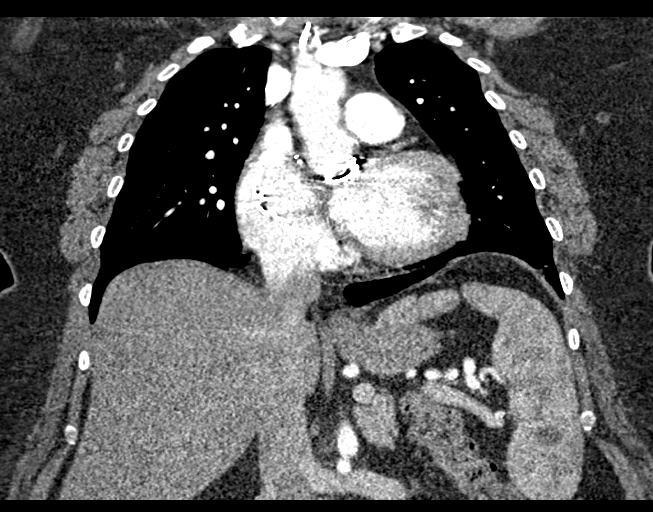
[im 122/163  soft-tissue]
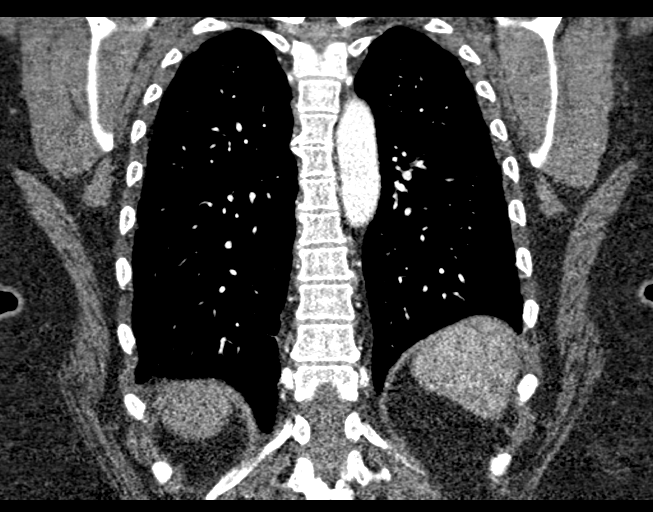

[17 of 46 positions shown; findings below may reference images not displayed]

FINDINGS: Cardiovascular: Surgical changes of prior mechanical aortic valve
and aortic root repair. No evidence of complicating feature. The
transverse aorta and descending thoracic aorta are normal in
caliber. No evidence of dissection. Conventional 3 vessel arch
anatomy. The main pulmonary artery is enlarged at 3.3 cm in
diameter. The heart is also enlarged, particularly the right heart.
A cardiac rhythm maintenance device is present with leads
terminating in the right atrium and right ventricular apex. No
evidence of pericardial effusion.

Mediastinum/Nodes: Unremarkable CT appearance of the thyroid gland.
No suspicious mediastinal or hilar adenopathy. No soft tissue
mediastinal mass. Small hiatal hernia.

Lungs/Pleura: Lungs are clear. No pleural effusion or pneumothorax.

Upper Abdomen: Approximately 2.7 cm low-attenuation lesion in the
caudate lobe of the liver which demonstrates peripheral incomplete
nodular enhancement. Similar findings were visible on prior CT scans
dating back to 1018. This almost certainly represents a benign
hemangioma and no further follow-up is required. No acute
abnormality identified within the upper abdomen. A circumscribed
low-attenuation lesion in the spleen measures 2 cm in diameter and
is statistically a most certainly benign.

Musculoskeletal: No acute fracture or aggressive appearing lytic or
blastic osseous lesion. Healed median sternotomy.

Review of the MIP images confirms the above findings.
IMPRESSION: 1. Surgical changes of interval revision aortic root and aortic
valve replacement with a mechanical valve compared to 06/27/2017. No
evidence of complication.
2. Cardiomegaly with right heart enlargement.
3. Enlarged main pulmonary artery consistent with underlying
pulmonary arterial hypertension.
4. Small hiatal hernia.
5. Additional ancillary findings as above.

## 2020-11-10 ENCOUNTER — Other Ambulatory Visit: Payer: Self-pay | Admitting: Cardiology

## 2020-11-16 ENCOUNTER — Other Ambulatory Visit: Payer: Self-pay

## 2020-11-16 ENCOUNTER — Ambulatory Visit (INDEPENDENT_AMBULATORY_CARE_PROVIDER_SITE_OTHER): Payer: 59

## 2020-11-16 DIAGNOSIS — Z5181 Encounter for therapeutic drug level monitoring: Secondary | ICD-10-CM

## 2020-11-16 DIAGNOSIS — Z952 Presence of prosthetic heart valve: Secondary | ICD-10-CM | POA: Diagnosis not present

## 2020-11-16 DIAGNOSIS — Z86711 Personal history of pulmonary embolism: Secondary | ICD-10-CM

## 2020-11-16 LAB — POCT INR: INR: 2.1 (ref 2.0–3.0)

## 2020-11-16 NOTE — Patient Instructions (Signed)
Continue warfain 2 tablets daily.  Recheck INR in 6 weeks at the [place] office. Please call the office with any medication changes or additions [phone].   

## 2020-11-23 DIAGNOSIS — I350 Nonrheumatic aortic (valve) stenosis: Secondary | ICD-10-CM | POA: Insufficient documentation

## 2020-11-23 DIAGNOSIS — G4733 Obstructive sleep apnea (adult) (pediatric): Secondary | ICD-10-CM | POA: Insufficient documentation

## 2020-11-23 DIAGNOSIS — Z9989 Dependence on other enabling machines and devices: Secondary | ICD-10-CM | POA: Insufficient documentation

## 2020-11-23 DIAGNOSIS — M1990 Unspecified osteoarthritis, unspecified site: Secondary | ICD-10-CM | POA: Insufficient documentation

## 2020-11-23 DIAGNOSIS — R011 Cardiac murmur, unspecified: Secondary | ICD-10-CM | POA: Insufficient documentation

## 2020-11-23 DIAGNOSIS — E7800 Pure hypercholesterolemia, unspecified: Secondary | ICD-10-CM | POA: Insufficient documentation

## 2020-11-23 DIAGNOSIS — I10 Essential (primary) hypertension: Secondary | ICD-10-CM | POA: Insufficient documentation

## 2020-11-23 DIAGNOSIS — N941 Unspecified dyspareunia: Secondary | ICD-10-CM | POA: Insufficient documentation

## 2020-11-23 DIAGNOSIS — Q249 Congenital malformation of heart, unspecified: Secondary | ICD-10-CM | POA: Insufficient documentation

## 2020-11-23 HISTORY — DX: Unspecified dyspareunia: N94.10

## 2020-11-25 ENCOUNTER — Ambulatory Visit: Payer: 59 | Admitting: Cardiology

## 2020-11-25 ENCOUNTER — Other Ambulatory Visit: Payer: Self-pay

## 2020-11-25 ENCOUNTER — Encounter: Payer: Self-pay | Admitting: Cardiology

## 2020-11-25 ENCOUNTER — Other Ambulatory Visit: Payer: Self-pay | Admitting: Cardiology

## 2020-11-25 VITALS — BP 136/82 | HR 80 | Ht 61.25 in | Wt 343.0 lb

## 2020-11-25 DIAGNOSIS — Z8674 Personal history of sudden cardiac arrest: Secondary | ICD-10-CM

## 2020-11-25 DIAGNOSIS — Z95810 Presence of automatic (implantable) cardiac defibrillator: Secondary | ICD-10-CM | POA: Diagnosis not present

## 2020-11-25 DIAGNOSIS — Z954 Presence of other heart-valve replacement: Secondary | ICD-10-CM

## 2020-11-25 DIAGNOSIS — Z952 Presence of prosthetic heart valve: Secondary | ICD-10-CM

## 2020-11-25 DIAGNOSIS — Z9989 Dependence on other enabling machines and devices: Secondary | ICD-10-CM

## 2020-11-25 DIAGNOSIS — G4733 Obstructive sleep apnea (adult) (pediatric): Secondary | ICD-10-CM

## 2020-11-25 NOTE — Patient Instructions (Signed)
Medication Instructions:  Your physician recommends that you continue on your current medications as directed. Please refer to the Current Medication list given to you today.  *If you need a refill on your cardiac medications before your next appointment, please call your pharmacy*   Lab Work: Your physician recommends that you return for lab work today: lipid  If you have labs (blood work) drawn today and your tests are completely normal, you will receive your results only by: . MyChart Message (if you have MyChart) OR . A paper copy in the mail If you have any lab test that is abnormal or we need to change your treatment, we will call you to review the results.   Testing/Procedures: Your physician has requested that you have an echocardiogram. Echocardiography is a painless test that uses sound waves to create images of your heart. It provides your doctor with information about the size and shape of your heart and how well your heart's chambers and valves are working. This procedure takes approximately one hour. There are no restrictions for this procedure.     Follow-Up: At [facility], you and your health needs are our priority.  As part of our continuing mission to provide you with exceptional heart care, we have created designated Provider Care Teams.  These Care Teams include your primary Cardiologist (physician) and Advanced Practice Providers (APPs -  Physician Assistants and Nurse Practitioners) who all work together to provide you with the care you need, when you need it.  We recommend signing up for the patient portal called "MyChart".  Sign up information is provided on this After Visit Summary.  MyChart is used to connect with patients for Virtual Visits (Telemedicine).  Patients are able to view lab/test results, encounter notes, upcoming appointments, etc.  Non-urgent messages can be sent to your provider as well.   To learn more about what you can do with MyChart, go to  [email or web address].    Your next appointment:   6 month(s)  The format for your next appointment:   In Person  Provider:   Robert Krasowski, MD   Other Instructions   Echocardiogram An echocardiogram is a test that uses sound waves (ultrasound) to produce images of the heart. Images from an echocardiogram can provide important information about:  Heart size and shape.  The size and thickness and movement of your heart's walls.  Heart muscle function and strength.  Heart valve function or if you have stenosis. Stenosis is when the heart valves are too narrow.  If blood is flowing backward through the heart valves (regurgitation).  A tumor or infectious growth around the heart valves.  Areas of heart muscle that are not working well because of poor blood flow or injury from a heart attack.  Aneurysm detection. An aneurysm is a weak or damaged part of an artery wall. The wall bulges out from the normal force of blood pumping through the body. Tell a health care provider about:  Any allergies you have.  All medicines you are taking, including vitamins, herbs, eye drops, creams, and over-the-counter medicines.  Any blood disorders you have.  Any surgeries you have had.  Any medical conditions you have.  Whether you are pregnant or may be pregnant. What are the risks? Generally, this is a safe test. However, problems may occur, including an allergic reaction to dye (contrast) that may be used during the test. What happens before the test? No specific preparation is needed. You may eat and drink normally.   What happens during the test?  You will take off your clothes from the waist up and put on a hospital gown.  Electrodes or electrocardiogram (ECG)patches may be placed on your chest. The electrodes or patches are then connected to a device that monitors your heart rate and rhythm.  You will lie down on a table for an ultrasound exam. A gel will be applied to  your chest to help sound waves pass through your skin.  A handheld device, called a transducer, will be pressed against your chest and moved over your heart. The transducer produces sound waves that travel to your heart and bounce back (or "echo" back) to the transducer. These sound waves will be captured in real-time and changed into images of your heart that can be viewed on a video monitor. The images will be recorded on a computer and reviewed by your health care provider.  You may be asked to change positions or hold your breath for a short time. This makes it easier to get different views or better views of your heart.  In some cases, you may receive contrast through an IV in one of your veins. This can improve the quality of the pictures from your heart. The procedure may vary among health care providers and hospitals.   What can I expect after the test? You may return to your normal, everyday life, including diet, activities, and medicines, unless your health care provider tells you not to do that. Follow these instructions at home:  It is up to you to get the results of your test. Ask your health care provider, or the department that is doing the test, when your results will be ready.  Keep all follow-up visits. This is important. Summary  An echocardiogram is a test that uses sound waves (ultrasound) to produce images of the heart.  Images from an echocardiogram can provide important information about the size and shape of your heart, heart muscle function, heart valve function, and other possible heart problems.  You do not need to do anything to prepare before this test. You may eat and drink normally.  After the echocardiogram is completed, you may return to your normal, everyday life, unless your health care provider tells you not to do that. This information is not intended to replace advice given to you by your health care provider. Make sure you discuss any questions you have  with your health care provider. Document Revised: 06/22/2020 Document Reviewed: 06/22/2020 Elsevier Patient Education  2021 [employer]   

## 2020-11-25 NOTE — Addendum Note (Signed)
Addended by: Senaida Ores on: 11/25/2020 10:10 AM   Modules accepted: Orders

## 2020-11-25 NOTE — Progress Notes (Signed)
Cardiology Office Note:    Date:  11/25/2020   ID:  Heather Mosley, DOB January 19, 1966, MRN [id number]  PCP:  Lise Auer, MD  Cardiologist:  Gypsy Balsam, MD    Referring MD: Lise Auer, MD   Chief Complaint  Patient presents with  . Follow-up  Am doing fine  History of Present Illness:    Heather Mosley is a 55 y.o. female  with complex past medical history which include aortic root replacement initially with bioprosthesis after data TAVR implanted that failed eventually she ended up getting mechanical Saint Jude valve 21 mm done on July 24, 2017 at [facility].  Also during the process of all this problem she had cardiac arrest, pulmonary emboli required ICD. She comes today 2 months for follow-up overall she is doing well she described to have some exertional shortness of breath as usual.  There is no chest pain tightness squeezing pressure burning chest she still struggling with obesity.  No discharges from defibrillator.  She was noted to have some nonsustained V. tach and she was put on mexiletine as well as beta-blocker seems to be tolerating this well.  Past Medical History:  Diagnosis Date  . Acute on chronic systolic ACC/AHA stage C congestive heart failure (HCC) 11/09/2014  . AICD (automatic cardioverter/defibrillator) present 2015  . AKI (acute kidney injury) (HCC) 07/24/2017  . Aortic valvular stenoses    "born w/it"  . Arthritis    "knees" (06/07/2017)  . Cardiac arrest (HCC) 11/03/2014  . Chest pain 06/07/2017  . Congenital heart defect   . Encounter for therapeutic drug monitoring 09/11/2017  . Exertional dyspnea 07/16/2017  . Heart murmur   . High cholesterol   . History of biliary T-tube placement 11/09/2014   Overview:  Overview:  BSX DC ICD implanted 11/09/14 (Followed by [facility])  Formatting of this note might be different from the original. Overview:  Overview:  BSX DC ICD implanted 11/09/14 (Followed by [facility]) Formatting of this note might be different  from the original. Overview:  BSX DC ICD implanted 11/09/14 (Followed by [facility])  . History of cardiac arrest 06/06/2017  . History of pulmonary embolism 06/06/2017  . Hypertension   . Hypoxemia 07/24/2017  . ICD (implantable cardioverter-defibrillator) in place 05/24/2016  . Lactate blood increased 07/24/2017  . Nonrheumatic aortic valve insufficiency 09/18/2014  . Obesity 06/02/2015  . OSA on CPAP   . Pulmonary embolism (HCC) 2015  . Right ventricular systolic dysfunction 07/24/2017  . S/P AVR (aortic valve replacement) 07/27/2017   St Jude Valve 21 mm - 07/24/2017 [facility]  . S/P ICD (internal cardiac defibrillator) procedure 11/09/2014   Overview:  BSX DC ICD implanted 11/09/14 (Followed by [facility])  Formatting of this note might be different from the original. BSX DC ICD implanted 11/09/14 (Followed by [facility])  . S/P TAVR (transcatheter aortic valve replacement) 09/18/2014  . Severe aortic stenosis 10/28/2014  . 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 She initially underwent aortic valve commissurotomy in 1985 with subsequent aortic root replacement with a porcine valve and ascending aorta and hemiarch replacement in 2006. She subsequently developed severe symptomatic prosthetic aortic valve regurgitation and underwent successful valve and valve transcatheter aortic valve replacement with a  . Unspecified dyspareunia (CODE) 11/23/2020    Past Surgical History:  Procedure Laterality Date  . ANOMALOUS PULMONARY VENOUS RETURN REPAIR, TOTAL  2015  . AORTIC VALVE REPAIR  [place] VALVE REPLACEMENT  2006   aortic valve/aortic root replacement with porcine valve, and extending aorta and hemi-arch replacement /notes 06/07/2017  . AORTIC VALVE REPLACEMENT    . [place]; 2006; 2015   "prior to my ORs those years"  . CARDIAC DEFIBRILLATOR PLACEMENT   2015  . CARDIAC VALVE REPLACEMENT    . CESAREAN SECTION  2008  . LEFT HEART CATH AND CORONARY ANGIOGRAPHY N/A 06/08/2017   Procedure: Left Heart Cath and Coronary Angiography;  Surgeon: Belva Crome, MD;  Location: [facility];  Service: Cardiovascular;  Laterality: N/A;    Current Medications: Current Meds  Medication Sig  . acetaminophen (TYLENOL) 325 MG tablet Take 650 mg by mouth 2 (two) times daily as needed for moderate pain or headache.   Marland Kitchen aspirin EC 81 MG tablet Take 81 mg by mouth daily.  Marland Kitchen atorvastatin (LIPITOR) 80 MG tablet TAKE ONE (1) TABLET BY MOUTH EVERY DAY **MUST HAVE APPT BEFORE MORE REFILLS* *  . diclofenac sodium (VOLTAREN) 1 % GEL Apply topically as needed.  . furosemide (LASIX) 20 MG tablet TAKE 2 TABLETS BY MOUTH ONCE DAILY.  Marland Kitchen Glucosamine 500 MG CAPS Take 2 capsules by mouth daily.  . metoprolol tartrate (LOPRESSOR) 50 MG tablet Take 1 tablet (50 mg total) by mouth 2 (two) times daily.  Marland Kitchen mexiletine (MEXITIL) 250 MG capsule Take 1 capsule (250 mg total) by mouth 2 (two) times daily.  . nitroGLYCERIN (NITROSTAT) 0.4 MG SL tablet Place 1 tablet (0.4 mg total) under the tongue every 5 (five) minutes x 3 doses as needed for chest pain.  . potassium chloride (KLOR-CON) 10 MEQ tablet Take 1 tablet (10 mEq total) by mouth as needed.  . warfarin (COUMADIN) 5 MG tablet Take 2 to 2.5 tablets by mouth daily as directed (Patient taking differently: Take 2  tablets by mouth daily as directed)  . [DISCONTINUED] warfarin (COUMADIN) 5 MG tablet Take 2 to 2.5 tablets daily  Or as directed by the anticoagulation clinic.     Allergies:   Tape   Social History   Socioeconomic History  . Marital status: Married    Spouse name: Not on file  . Number of children: Not on file  . Years of education: Not on file  . Highest education level: Not on file  Occupational History  . Not on file  Tobacco Use  . Smoking status: Never Smoker  . Smokeless tobacco: Never Used   Vaping Use  . Vaping Use: Never used  Substance and Sexual Activity  . Alcohol use: No  . Drug use: No  . Sexual activity: Yes  Other Topics Concern  . Not on file  Social History Narrative  . Not on file   Social Determinants of Health   Financial Resource Strain: Not on file  Food Insecurity: Not on file  Transportation Needs: Not on file  Physical Activity: Not on file  Stress: Not on file  Social Connections: Not on file     Family History: The patient's family history includes Hyperlipidemia in her father; Hypertension in her mother; Leukemia in her father. ROS:   Please see the history of present illness.    All 14 point review of systems negative except as described per history of present illness  EKGs/Labs/Other Studies Reviewed:      Recent Labs: No results found for requested labs within last 8760 hours.  Recent Lipid Panel    Component Value Date/Time  CHOL 205 (H) 11/12/2019 0814   TRIG 313 (H) 11/12/2019 0814   HDL 52 11/12/2019 0814   CHOLHDL 3.9 11/12/2019 0814   LDLCALC 100 (H) 11/12/2019 0814    Physical Exam:    VS:  BP 136/82 (BP Location: Right Arm, Patient Position: Sitting)   Pulse 80   Ht 5' 1.25" (1.556 m)   Wt (!) 343 lb (155.6 kg)   SpO2 95%   BMI 64.28 kg/m     Wt Readings from Last 3 Encounters:  11/25/20 (!) 343 lb (155.6 kg)  08/23/20 (!) 346 lb 12.8 oz (157.3 kg)  09/22/19 (!) 333 lb 6.4 oz (151.2 kg)     GEN:  Well nourished, well developed in no acute distress HEENT: Normal NECK: No JVD; No carotid bruits LYMPHATICS: No lymphadenopathy CARDIAC: RRR, crisp mechanical valve sounds normal, soft systolic murmur grade 1/6 best heard right upper portion of the sternum, no rubs, no gallops RESPIRATORY:  Clear to auscultation without rales, wheezing or rhonchi  ABDOMEN: Soft, non-tender, non-distended MUSCULOSKELETAL:  No edema; No deformity  SKIN: Warm and dry LOWER EXTREMITIES: no swelling NEUROLOGIC:  Alert and  oriented x 3 PSYCHIATRIC:  Normal affect   ASSESSMENT:    1. S/P AVR (aortic valve replacement)   2. S/P ICD (internal cardiac defibrillator) procedure   3. 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 History of cardiac arrest   5. OSA on CPAP    PLAN:    In order of problems listed above:  1. Status post arctic valve replacement.  Her valve sounds good.  Echocardiogram will be done. 2. ICD present.  Interrogation reviewed normal parameters and doing well. 3. Status post combined arctic root replacement valve replacement.  Stable. 4. History of cardiac arrest doing well. 5. Obstructive sleep apnea on CPAP. 6. Morbid obesity still ongoing problem.  Will try to refer her to our weight management clinic. 7. Dyslipidemia fasting lipid profile will be done.   Medication Adjustments/Labs and Tests Ordered: Current medicines are reviewed at length with the patient today.  Concerns regarding medicines are outlined above.  No orders of the defined types were placed in this encounter.  Medication changes: No orders of the defined types were placed in this encounter.   Signed, Park Liter, MD, [facility] 11/25/2020 10:02 AM    [place]

## 2020-11-26 ENCOUNTER — Telehealth: Payer: Self-pay

## 2020-11-26 LAB — LIPID PANEL
Chol/HDL Ratio: 3.6 ratio (ref 0.0–4.4)
Cholesterol, Total: 175 mg/dL (ref 100–199)
HDL: 49 mg/dL (ref >39)
LDL Chol Calc (NIH): 91 mg/dL (ref 0–99)
Triglycerides: 207 mg/dL — ABNORMAL HIGH (ref 0–149)
VLDL Cholesterol Cal: 35 mg/dL (ref 5–40)

## 2020-11-26 NOTE — Telephone Encounter (Signed)
-----   Message from Park Liter, MD sent at 11/26/2020  9:08 AM EST ----- Cholesterol acceptable, continue present management

## 2020-11-26 NOTE — Telephone Encounter (Signed)
Patient advised of results and verbalized understanding.  

## 2020-11-29 ENCOUNTER — Other Ambulatory Visit: Payer: Self-pay | Admitting: Cardiology

## 2020-11-29 DIAGNOSIS — Z952 Presence of prosthetic heart valve: Secondary | ICD-10-CM

## 2020-12-02 ENCOUNTER — Ambulatory Visit (INDEPENDENT_AMBULATORY_CARE_PROVIDER_SITE_OTHER): Payer: 59

## 2020-12-02 DIAGNOSIS — I5189 Other ill-defined heart diseases: Secondary | ICD-10-CM

## 2020-12-02 LAB — CUP PACEART REMOTE DEVICE CHECK
Battery Remaining Longevity: 102 mo
Battery Remaining Percentage: 100 %
Brady Statistic RA Percent Paced: 48 %
Brady Statistic RV Percent Paced: 0 %
Date Time Interrogation Session: 20220120030100
HighPow Impedance: 85 Ohm
Implantable Lead Implant Date: 20151228
Implantable Lead Implant Date: 20151228
Implantable Lead Location: 753859
Implantable Lead Location: 753860
Implantable Lead Model: 292
Implantable Lead Model: 5076
Implantable Lead Serial Number: 358765
Implantable Pulse Generator Implant Date: 20151228
Lead Channel Impedance Value: 427 Ohm
Lead Channel Impedance Value: 499 Ohm
Lead Channel Pacing Threshold Amplitude: 0.6 V
Lead Channel Pacing Threshold Amplitude: 0.9 V
Lead Channel Pacing Threshold Pulse Width: 0.4 ms
Lead Channel Pacing Threshold Pulse Width: 0.4 ms
Lead Channel Setting Pacing Amplitude: 2 V
Lead Channel Setting Pacing Amplitude: 2.4 V
Lead Channel Setting Pacing Pulse Width: 0.4 ms
Lead Channel Setting Sensing Sensitivity: 0.5 mV
Pulse Gen Serial Number: 508420

## 2020-12-06 ENCOUNTER — Other Ambulatory Visit: Payer: Self-pay | Admitting: Cardiology

## 2020-12-15 NOTE — Progress Notes (Signed)
Remote ICD transmission.   

## 2020-12-20 ENCOUNTER — Encounter: Payer: 59 | Admitting: Cardiology

## 2020-12-23 ENCOUNTER — Ambulatory Visit (INDEPENDENT_AMBULATORY_CARE_PROVIDER_SITE_OTHER): Payer: 59

## 2020-12-23 ENCOUNTER — Other Ambulatory Visit: Payer: Self-pay

## 2020-12-23 DIAGNOSIS — Z95810 Presence of automatic (implantable) cardiac defibrillator: Secondary | ICD-10-CM

## 2020-12-23 DIAGNOSIS — Z8674 Personal history of sudden cardiac arrest: Secondary | ICD-10-CM

## 2020-12-23 DIAGNOSIS — G4733 Obstructive sleep apnea (adult) (pediatric): Secondary | ICD-10-CM

## 2020-12-23 DIAGNOSIS — Z954 Presence of other heart-valve replacement: Secondary | ICD-10-CM | POA: Diagnosis not present

## 2020-12-23 DIAGNOSIS — Z952 Presence of prosthetic heart valve: Secondary | ICD-10-CM | POA: Diagnosis not present

## 2020-12-23 DIAGNOSIS — Z9989 Dependence on other enabling machines and devices: Secondary | ICD-10-CM

## 2020-12-23 LAB — ECHOCARDIOGRAM COMPLETE
AR max vel: 1.28 cm2
AV Area VTI: 1.36 cm2
AV Area mean vel: 1.31 cm2
AV Mean grad: 23 mmHg
AV Peak grad: 37.5 mmHg
Ao pk vel: 3.06 m/s
Area-P 1/2: 2.6 cm2
S' Lateral: 2.5 cm

## 2020-12-23 NOTE — Progress Notes (Signed)
Complete echocardiogram performed.  Jimmy Zyria Fiscus RDCS, RVT  

## 2020-12-28 ENCOUNTER — Other Ambulatory Visit: Payer: Self-pay

## 2020-12-28 ENCOUNTER — Ambulatory Visit (INDEPENDENT_AMBULATORY_CARE_PROVIDER_SITE_OTHER): Payer: 59

## 2020-12-28 DIAGNOSIS — Z86711 Personal history of pulmonary embolism: Secondary | ICD-10-CM

## 2020-12-28 DIAGNOSIS — Z5181 Encounter for therapeutic drug level monitoring: Secondary | ICD-10-CM | POA: Diagnosis not present

## 2020-12-28 DIAGNOSIS — Z952 Presence of prosthetic heart valve: Secondary | ICD-10-CM | POA: Diagnosis not present

## 2020-12-28 LAB — POCT INR: INR: 2.3 (ref 2.0–3.0)

## 2020-12-28 NOTE — Patient Instructions (Signed)
Continue warfain 2 tablets daily.  Recheck INR in 6 weeks at the [place] office. Please call the office with any medication changes or additions [phone].   

## 2021-01-19 ENCOUNTER — Telehealth: Payer: Self-pay | Admitting: Cardiology

## 2021-01-19 MED ORDER — ATORVASTATIN CALCIUM 80 MG PO TABS: 80.0000 mg | ORAL_TABLET | Freq: Every day | ORAL | 3 refills | Status: DC

## 2021-01-19 NOTE — Telephone Encounter (Signed)
Refill sent in per request.  

## 2021-01-19 NOTE — Telephone Encounter (Signed)
*  STAT* If patient is at the pharmacy, call can be transferred to refill team.   1. Which medications need to be refilled? (please list name of each medication and dose if known) need a new prescription for Atorvastatin  2. Which pharmacy/location (including street and city if local pharmacy) is medication to be sent to? [facility], [place]  3. Do they need a 30 day or 90 day supply? 90 days and refills

## 2021-02-07 ENCOUNTER — Telehealth: Payer: Self-pay | Admitting: Emergency Medicine

## 2021-02-07 ENCOUNTER — Telehealth: Payer: Self-pay | Admitting: Cardiology

## 2021-02-07 NOTE — Telephone Encounter (Signed)
Patient s/p St. Jude mechanical AVR and with history of PE (2015) on warfarin for anticoagulation. Her TAVR was done in 10/29/2014. After discharge, had cardiac arrest and found to have PE on 11/03/2014, requiring ICD placement. Had follow up mechanical St. Jude AVR at [facility] in 2018.    Procedure: Right ring trigger finger release Date of procedure: TBD  CrCl 101 mL/min using adjusted body weight (Scr from 2020) Platelet count 193K (2018)  Will route to physician for input regarding whether Lovenox bridge is needed given history of mechanical AVR and history of PE in the setting of cardiac arrest.

## 2021-02-07 NOTE — Telephone Encounter (Signed)
   [facility] Pre-operative Risk Assessment    Request for surgical clearance:  1. What type of surgery is being performed? Right ring trigger finger release  2. When is this surgery scheduled?  TBD   3. What type of clearance is required (medical clearance vs. Pharmacy clearance to hold med vs. Both)? both  4. Are there any medications that need to be held prior to surgery and how long? warfarin (COUMADIN) 5 MG tablet length would be determine by the dr and would need to know if need bridge  5. Practice name and name of physician performing surgery? Dr Mertie Clause  6. What is your office phone number [phone]   7.   What is your office fax number [phone]  8.   Anesthesia type (None, local, MAC, general) ? Light sedation with Heather nerve block  Heather Mosley Heather Mosley 02/07/2021, 8:59 AM  _________________________________________________________________   (provider comments below)

## 2021-02-07 NOTE — Telephone Encounter (Signed)
Events reviewed with [employer] rep. Change in RA impedance variation caused adjustment in minute volume to correct. Continue to monitor.

## 2021-02-07 NOTE — Telephone Encounter (Signed)
Blatitude alert for 3 episodes of AF 02/03/21 , longest lasting 13 seconds. Questionable EMI+ coumadin, Lopressor 50 mg BID, mexilitine 250 mg BID. Patient was asymptomatic will continue to monitor.

## 2021-02-08 ENCOUNTER — Ambulatory Visit (INDEPENDENT_AMBULATORY_CARE_PROVIDER_SITE_OTHER): Payer: 59

## 2021-02-08 ENCOUNTER — Other Ambulatory Visit: Payer: Self-pay

## 2021-02-08 DIAGNOSIS — Z86711 Personal history of pulmonary embolism: Secondary | ICD-10-CM | POA: Diagnosis not present

## 2021-02-08 DIAGNOSIS — Z952 Presence of prosthetic heart valve: Secondary | ICD-10-CM

## 2021-02-08 DIAGNOSIS — Z5181 Encounter for therapeutic drug level monitoring: Secondary | ICD-10-CM | POA: Diagnosis not present

## 2021-02-08 LAB — POCT INR: INR: 2.6 (ref 2.0–3.0)

## 2021-02-08 NOTE — Patient Instructions (Signed)
Continue warfain 2 tablets daily.  Recheck INR in 6 weeks at the [place] office. Please call the office with any medication changes or additions [phone].

## 2021-02-09 ENCOUNTER — Telehealth: Payer: Self-pay | Admitting: Emergency Medicine

## 2021-02-09 ENCOUNTER — Ambulatory Visit (INDEPENDENT_AMBULATORY_CARE_PROVIDER_SITE_OTHER): Payer: 59 | Admitting: Emergency Medicine

## 2021-02-09 DIAGNOSIS — I472 Ventricular tachycardia, unspecified: Secondary | ICD-10-CM

## 2021-02-09 LAB — CUP PACEART INCLINIC DEVICE CHECK
Date Time Interrogation Session: 20220330153129
HighPow Impedance: 85 Ohm
Implantable Lead Implant Date: 20151228
Implantable Lead Implant Date: 20151228
Implantable Lead Location: 753859
Implantable Lead Location: 753860
Implantable Lead Model: 292
Implantable Lead Model: 5076
Implantable Lead Serial Number: 358765
Implantable Pulse Generator Implant Date: 20151228
Lead Channel Impedance Value: 502 Ohm
Lead Channel Impedance Value: 523 Ohm
Lead Channel Pacing Threshold Amplitude: 0.9 V
Lead Channel Pacing Threshold Pulse Width: 0.4 ms
Lead Channel Sensing Intrinsic Amplitude: 16.8 mV
Lead Channel Sensing Intrinsic Amplitude: 4.2 mV
Lead Channel Setting Pacing Amplitude: 2 V
Lead Channel Setting Pacing Amplitude: 2.4 V
Lead Channel Setting Pacing Pulse Width: 0.4 ms
Lead Channel Setting Sensing Sensitivity: 0.5 mV
Pulse Gen Serial Number: 508420

## 2021-02-09 NOTE — Telephone Encounter (Signed)
Patient reports audible alert from device. [employer] rep consulted and device will need to be interrigated to acknowledge alarm. Will come in today at 1130 to device clinic.

## 2021-02-09 NOTE — Progress Notes (Signed)
Latitude alert received 02/05/21 for atrial pacing out of range. Per notes see below.       Patient called device clinic noting her device was making a beeping sound. Consulted with [employer] and recommends patient come into clinic to have beeping disabled.  ICD check in clinic. Thresholds and sensing consistent with previous device measurements. RV impedance trends stable over time. RA impedance was elevated briefly and has returned back to normal.  Consulted with Joey from [place] and recommends to turn off atrial impedance beep alert in device.  Alerts will be seen and monitored via home remotes. Change made in device. Histogram distribution appropriate for patient and level of activity. Device programmed at appropriate safety margins. Device programmed to optimize intrinsic conduction. Pt enrolled in remote follow-up 03/03/21. Patient education completed.   A few observations noted, patients underlying was AS/VS 70 bpm today. Her LRL is set at 75 bpm, was programmed this way when she came into the practice in 2019. She is AP 48% of the time today. Dr. Ileana Ladd, do you want to leave her LRL at 75 bpm or consider lowering at next OV?  Routing to Dr. Curt Bears for his review and recommendations.  RA lead test completed today 02/09/21 (see below)  RA Impedance ~ 523 ohmns RA Sensing ~ 4.2 mV RA threshold ~ 0.9V @ 0.4 ms

## 2021-02-16 NOTE — Telephone Encounter (Signed)
Dr. Agustin Cree, please see note from Pharmacy below in regards to whether or not Lovenox bridge is needed.   Please route response back to P CV DIV PREOP.  Thank you!

## 2021-02-16 NOTE — Telephone Encounter (Signed)
Left message to call back and ask to speak with pre-op team.  Will also route note back to Pharmacy pool since Dr. Drue Dun as recommended Lovenox bridge.  Darreld Mclean, PA-C 02/16/2021 11:19 AM

## 2021-02-16 NOTE — Telephone Encounter (Signed)
Normally will not bridge patient for [facility] valve prosthesis but with her history of PE and multiple risk factors I propose to bridge her with Lovenox

## 2021-02-16 NOTE — Telephone Encounter (Signed)
We will coordinate bridge in coumadin clinic in [place]. Patient needs to let us know ASAP when her procedure is so that we can coordinate bridge.

## 2021-02-16 NOTE — Telephone Encounter (Signed)
[name] is calling back due to never receiving a response in regards to this clearance. Please advise.

## 2021-02-17 NOTE — Telephone Encounter (Signed)
   Patient Name: Heather Mosley  DOB: Feb 09, 1966  MRN: [id number]   Primary Cardiologist: Jenne Campus, MD  Chart reviewed as part of pre-operative protocol coverage. Patient was contacted 02/17/2021 in reference to pre-operative risk assessment for pending surgery as outlined below.  JASHIRA COTUGNO was last seen on 11/25/2020 by Dr. Agustin Cree.  Since that day, RAYANNE PADMANABHAN has done well without chest pain or worsening dyspnea.  Therefore, based on ACC/AHA guidelines, the patient would be at acceptable risk for the planned procedure without further cardiovascular testing.   Please forward a separate cardiac clearance to our device clinic as patient has a [place].  Our device clinic can give further guidance on what need to be done on the ICD during the surgery. (Although given the fact surgery is in the distal extremities, may not need anything to be done on the ICD given the distal location.)  Please set the surgery at least 6 days or more out from the time you have received this letter as the patient will need at least 5 days of holding for the Coumadin with Lovenox bridging.  Once date and the time of the surgery has been set, the patient will need to contact our Coumadin clinic in [place] to arrange Lovenox bridging.  The patient was advised that if she develops new symptoms prior to surgery to contact our office to arrange for a follow-up visit, and she verbalized understanding.  I will route this recommendation to the requesting party via Epic fax function and remove from pre-op pool. Please call with questions.  [place], [place] 02/17/2021, 4:20 PM

## 2021-02-21 NOTE — Telephone Encounter (Addendum)
   Patient Name: Heather Mosley  DOB: Jul 11, 1966  MRN: [id number]   Primary Cardiologist: Jenne Campus, MD  Chart reviewed as part of pre-operative protocol coverage. Given past medical history and time since last visit, based on ACC/AHA guidelines, Heather Mosley would be at acceptable risk for the planned procedure without further cardiovascular testing.   Called and spoke to patient and informed her of recommendations for Lovenox bridging. Pt does not have date for procedure yet. Plan to see patient as pharmacy visit 6-7 days prior to procedure. Patient voiced understanding.  The patient was advised that if she develops new symptoms prior to surgery to contact our office to arrange for a follow-up visit, and she verbalized understanding.  I will route this recommendation to the requesting party via Epic fax function and remove from pre-op pool.  Please call with questions.  Corvette Orser Ninfa Meeker, PA-C 02/21/2021, 2:40 PM

## 2021-02-22 ENCOUNTER — Telehealth: Payer: Self-pay | Admitting: Cardiology

## 2021-02-22 NOTE — Telephone Encounter (Signed)
Follow Up:    Heather Mosley is calling to checkon the status of pt's clearance. Please fax asap to [phone].

## 2021-02-22 NOTE — Telephone Encounter (Signed)
Clearance was faxed again

## 2021-03-01 ENCOUNTER — Other Ambulatory Visit: Payer: Self-pay

## 2021-03-01 ENCOUNTER — Ambulatory Visit (INDEPENDENT_AMBULATORY_CARE_PROVIDER_SITE_OTHER): Payer: 59

## 2021-03-01 DIAGNOSIS — Z952 Presence of prosthetic heart valve: Secondary | ICD-10-CM | POA: Diagnosis not present

## 2021-03-01 DIAGNOSIS — Z5181 Encounter for therapeutic drug level monitoring: Secondary | ICD-10-CM

## 2021-03-01 DIAGNOSIS — Z86711 Personal history of pulmonary embolism: Secondary | ICD-10-CM | POA: Diagnosis not present

## 2021-03-01 LAB — POCT INR: INR: 2.1 (ref 2.0–3.0)

## 2021-03-01 MED ORDER — ENOXAPARIN SODIUM 150 MG/ML ~~LOC~~ SOLN: 150.0000 mg | Freq: Two times a day (BID) | SUBCUTANEOUS | 1 refills | Status: DC

## 2021-03-01 NOTE — Patient Instructions (Signed)
Continue warfain 2 tablets daily.  Recheck INR in 3 weeks at the [place] office. Please call the office with any medication changes or additions [phone].   4/23: Last dose of warfarin.  4/24: No warfarin or enoxaparin (Lovenox).  4/25: Inject enoxaparin 150mg  in the fatty abdominal tissue at least 2 inches from the belly button twice a day about 12 hours apart, 8am and 8pm rotate sites. No warfarin.  4/26: Inject enoxaparin in the fatty tissue every 12 hours, 8am and 8pm. No warfarin.  4/27: Inject enoxaparin in the fatty tissue every 12 hours, 8am and 8pm. No warfarin.  4/28: Inject enoxaparin in the fatty tissue in the morning at 8 am (No PM dose). No warfarin.  4/29: Procedure Day - No enoxaparin - Resume warfarin in the evening or as directed by doctor  4/30: Resume enoxaparin inject in the fatty tissue every 12 hours and take warfarin  5/1: Inject enoxaparin in the fatty tissue every 12 hours and take warfarin  5/2: Inject enoxaparin in the fatty tissue every 12 hours and take warfarin  5/3: Take AM Dose of enoxaparin; warfarin appt to check INR.

## 2021-03-03 ENCOUNTER — Ambulatory Visit (INDEPENDENT_AMBULATORY_CARE_PROVIDER_SITE_OTHER): Payer: 59

## 2021-03-03 DIAGNOSIS — I472 Ventricular tachycardia, unspecified: Secondary | ICD-10-CM

## 2021-03-03 LAB — CUP PACEART REMOTE DEVICE CHECK
Battery Remaining Longevity: 96 mo
Battery Remaining Percentage: 100 %
Brady Statistic RA Percent Paced: 52 %
Brady Statistic RV Percent Paced: 0 %
Date Time Interrogation Session: 20220421032800
HighPow Impedance: 80 Ohm
Implantable Lead Implant Date: 20151228
Implantable Lead Implant Date: 20151228
Implantable Lead Location: 753859
Implantable Lead Location: 753860
Implantable Lead Model: 292
Implantable Lead Model: 5076
Implantable Lead Serial Number: 358765
Implantable Pulse Generator Implant Date: 20151228
Lead Channel Impedance Value: 499 Ohm
Lead Channel Impedance Value: 569 Ohm
Lead Channel Pacing Threshold Amplitude: 0.9 V
Lead Channel Pacing Threshold Amplitude: 0.9 V
Lead Channel Pacing Threshold Pulse Width: 0.4 ms
Lead Channel Pacing Threshold Pulse Width: 0.4 ms
Lead Channel Setting Pacing Amplitude: 2 V
Lead Channel Setting Pacing Amplitude: 2.4 V
Lead Channel Setting Pacing Pulse Width: 0.4 ms
Lead Channel Setting Sensing Sensitivity: 0.5 mV
Pulse Gen Serial Number: 508420

## 2021-03-04 ENCOUNTER — Other Ambulatory Visit: Payer: Self-pay

## 2021-03-04 MED ORDER — MEXILETINE HCL 250 MG PO CAPS: 250.0000 mg | ORAL_CAPSULE | Freq: Two times a day (BID) | ORAL | 2 refills | Status: DC

## 2021-03-04 NOTE — Telephone Encounter (Signed)
Refill of Mexiletine 250 mg.

## 2021-03-09 ENCOUNTER — Telehealth: Payer: Self-pay | Admitting: Emergency Medicine

## 2021-03-09 NOTE — Telephone Encounter (Signed)
Hope called with answer to questions concerning surgery. Confirmed fax # to send form to is [phone] per [facility]. Form faxed with copy of last transmission and Dr Macky Lower last office note.

## 2021-03-09 NOTE — Telephone Encounter (Signed)
LM with Opal Sidles to determine if cautery will be used during surgery for right hand on 03/11/21. Jane given device clinic # to call back with information.

## 2021-03-15 ENCOUNTER — Other Ambulatory Visit: Payer: Self-pay

## 2021-03-15 ENCOUNTER — Ambulatory Visit (INDEPENDENT_AMBULATORY_CARE_PROVIDER_SITE_OTHER): Payer: 59

## 2021-03-15 DIAGNOSIS — Z5181 Encounter for therapeutic drug level monitoring: Secondary | ICD-10-CM

## 2021-03-15 DIAGNOSIS — Z952 Presence of prosthetic heart valve: Secondary | ICD-10-CM | POA: Diagnosis not present

## 2021-03-15 DIAGNOSIS — Z86711 Personal history of pulmonary embolism: Secondary | ICD-10-CM

## 2021-03-15 LAB — POCT INR: INR: 1.2 — AB (ref 2.0–3.0)

## 2021-03-15 NOTE — Patient Instructions (Signed)
Take 3 tablets tonight only and then continue 2 tablets daily. Finish Lovenox injections over the next few days. Recheck INR in 4 weeks at the [place] office. Please call the office with any medication changes or additions [phone].

## 2021-03-22 NOTE — Progress Notes (Signed)
Remote ICD transmission.   

## 2021-04-12 ENCOUNTER — Ambulatory Visit: Payer: 59

## 2021-04-12 ENCOUNTER — Other Ambulatory Visit: Payer: Self-pay | Admitting: Cardiology

## 2021-04-12 ENCOUNTER — Other Ambulatory Visit: Payer: Self-pay

## 2021-04-12 DIAGNOSIS — Z86711 Personal history of pulmonary embolism: Secondary | ICD-10-CM

## 2021-04-12 DIAGNOSIS — I4891 Unspecified atrial fibrillation: Secondary | ICD-10-CM

## 2021-04-12 DIAGNOSIS — Z952 Presence of prosthetic heart valve: Secondary | ICD-10-CM

## 2021-04-12 DIAGNOSIS — Z5181 Encounter for therapeutic drug level monitoring: Secondary | ICD-10-CM | POA: Diagnosis not present

## 2021-04-12 LAB — POCT INR: INR: 1.9 — AB (ref 2.0–3.0)

## 2021-04-12 NOTE — Patient Instructions (Signed)
Take 3 tablets tonight only and then continue 2 tablets daily.  Recheck INR in 6 weeks at the [place] office. Please call the office with any medication changes or additions [phone].

## 2021-04-13 ENCOUNTER — Telehealth: Payer: Self-pay | Admitting: Cardiology

## 2021-04-13 DIAGNOSIS — Z952 Presence of prosthetic heart valve: Secondary | ICD-10-CM

## 2021-04-13 MED ORDER — WARFARIN SODIUM 5 MG PO TABS: ORAL_TABLET | ORAL | 0 refills | Status: DC

## 2021-04-13 NOTE — Telephone Encounter (Signed)
*  STAT* If patient is at the pharmacy, call can be transferred to refill team.   1. Which medications need to be refilled? (please list name of each medication and dose if known) warfarin (COUMADIN) 5 MG tablet  2. Which pharmacy/location (including street and city if local pharmacy) is medication to be sent to? [place], [place].  3. Do they need a 30 day or 90 day supply? 30 day supply

## 2021-04-13 NOTE — Telephone Encounter (Signed)
Refill sent coumadin

## 2021-05-24 ENCOUNTER — Other Ambulatory Visit: Payer: Self-pay

## 2021-05-24 ENCOUNTER — Ambulatory Visit: Payer: 59

## 2021-05-24 DIAGNOSIS — Z5181 Encounter for therapeutic drug level monitoring: Secondary | ICD-10-CM | POA: Diagnosis not present

## 2021-05-24 DIAGNOSIS — Z86711 Personal history of pulmonary embolism: Secondary | ICD-10-CM | POA: Diagnosis not present

## 2021-05-24 DIAGNOSIS — Z952 Presence of prosthetic heart valve: Secondary | ICD-10-CM | POA: Diagnosis not present

## 2021-05-24 LAB — POCT INR: INR: 1.6 — AB (ref 2.0–3.0)

## 2021-05-24 NOTE — Patient Instructions (Signed)
Take 3 tablets tonight only and then continue 2 tablets daily.  Recheck INR in 3 weeks at the [place] office. Please call the office with any medication changes or additions [phone].

## 2021-06-02 ENCOUNTER — Telehealth: Payer: Self-pay

## 2021-06-02 ENCOUNTER — Ambulatory Visit (INDEPENDENT_AMBULATORY_CARE_PROVIDER_SITE_OTHER): Payer: 59

## 2021-06-02 DIAGNOSIS — I469 Cardiac arrest, cause unspecified: Secondary | ICD-10-CM

## 2021-06-02 LAB — CUP PACEART REMOTE DEVICE CHECK
Battery Remaining Longevity: 90 mo
Battery Remaining Percentage: 100 %
Brady Statistic RA Percent Paced: 49 %
Brady Statistic RV Percent Paced: 0 %
Date Time Interrogation Session: 20220721030200
HighPow Impedance: 88 Ohm
Implantable Lead Implant Date: 20151228
Implantable Lead Implant Date: 20151228
Implantable Lead Location: 753859
Implantable Lead Location: 753860
Implantable Lead Model: 292
Implantable Lead Model: 5076
Implantable Lead Serial Number: 358765
Implantable Pulse Generator Implant Date: 20151228
Lead Channel Impedance Value: 3000 Ohm
Lead Channel Impedance Value: 524 Ohm
Lead Channel Pacing Threshold Amplitude: 0.9 V
Lead Channel Pacing Threshold Amplitude: 0.9 V
Lead Channel Pacing Threshold Pulse Width: 0.4 ms
Lead Channel Pacing Threshold Pulse Width: 0.4 ms
Lead Channel Setting Pacing Amplitude: 2 V
Lead Channel Setting Pacing Amplitude: 2.4 V
Lead Channel Setting Pacing Pulse Width: 0.4 ms
Lead Channel Setting Sensing Sensitivity: 0.5 mV
Pulse Gen Serial Number: 508420

## 2021-06-02 NOTE — Telephone Encounter (Signed)
Patient has been scheduled to see Dr. Curt Bears 06/06/21 in [place] at 11:45 am

## 2021-06-02 NOTE — Telephone Encounter (Signed)
Scheduled latitude alert received for RA lead impedance out of range (> 3000 ohms). Hx of brief elevated numbers according to last in-clinic 02/09/21 (trends appeared stable). RA pacing 49%. Routing to Dr. Curt Bears for any new recommendations.

## 2021-06-03 ENCOUNTER — Telehealth: Payer: Self-pay

## 2021-06-03 NOTE — Telephone Encounter (Signed)
Patient called wanting clarification about if she needed to come in this close to her remote and she did not think she was overdue.   Patient had apt. Follow up in 6 months back in 08/2020 so patient was overdue. Explained to patient about RA lead impedance alert and how Dr. Curt Bears did recommend her coming into the office to have her device checked at at the office visit. Patient verbalized understanding and agreeable to plan. Appreciative of clarification.

## 2021-06-03 NOTE — Telephone Encounter (Signed)
The patient wanted to know why she needs to come in. I told her per the nurses note that her RA lead impedance was high. Per Dr. Curt Bears states that she is overdue to see him. I let her speak with Leigh, rn.

## 2021-06-04 NOTE — Progress Notes (Signed)
Electrophysiology Office Note   Date:  06/06/2021   ID:  Heather Mosley, DOB Apr 03, 1966, MRN [id number]  PCP:  Mateo Flow, MD  Cardiologist:  Agustin Cree Primary Electrophysiologist:  Toriano Aikey Meredith Leeds, MD    No chief complaint on file.    History of Present Illness: Heather Mosley is a 55 y.o. female who is being seen today for the evaluation of ventricular tachycardia, ICD at the request of Jenne Campus. Presenting today for electrophysiology evaluation.    She has a history of aortic valve/aortic root replacement with a porcine valve and extended aortic and Hemi arch replacement in 2006, hypertension, obesity.  She developed symptomatic prosthetic regurgitation and underwent successful TAVR in 2015.  Shortly afterwards she developed PEA arrest and cardiac arrest secondary to VT.  She is status post ICD implanted at [facility] and was placed on Xarelto.  She had recurrent tachycardia and was started on mexiletine.  Today, denies symptoms of palpitations, chest pain, shortness of breath, orthopnea, PND, lower extremity edema, claudication, dizziness, presyncope, syncope, bleeding, or neurologic sequela. The patient is tolerating medications without difficulties.  Since being seen she has done well.  She has no chest pain or shortness of breath.  She is able to do all of her daily activities without issue.  She has noted no further palpitations.  She is overall comfortable with her control.  Past Medical History:  Diagnosis Date   Acute on chronic systolic ACC/AHA stage C congestive heart failure ([place]) 11/09/2014   AICD (automatic cardioverter/defibrillator) present 2015   AKI (acute kidney injury) ([place]) 07/24/2017   Aortic valvular stenoses    "born w/it"   Arthritis    "knees" (06/07/2017)   Cardiac arrest ([place]) 11/03/2014   Chest pain 06/07/2017   Congenital heart defect    Encounter for therapeutic drug monitoring 09/11/2017   Exertional dyspnea 07/16/2017    Heart murmur    High cholesterol    History of biliary T-tube placement 11/09/2014   Overview:  Overview:  BSX DC ICD implanted 11/09/14 (Followed by [facility])  Formatting of this note might be different from the original. Overview:  Overview:  BSX DC ICD implanted 11/09/14 (Followed by [facility]) Formatting of this note might be different from the original. Overview:  BSX DC ICD implanted 11/09/14 (Followed by [facility])   History of cardiac arrest 06/06/2017   History of pulmonary embolism 06/06/2017   Hypertension    Hypoxemia 07/24/2017   ICD (implantable cardioverter-defibrillator) in place 05/24/2016   Lactate blood increased 07/24/2017   Nonrheumatic aortic valve insufficiency 09/18/2014   Obesity 06/02/2015   OSA on CPAP    Pulmonary embolism ([place]) 2015   Right ventricular systolic dysfunction [id number]   S/P AVR (aortic valve replacement) 07/27/2017   St Jude Valve 21 mm - 07/24/2017 [facility]   S/P ICD (internal cardiac defibrillator) procedure 11/09/2014   Overview:  BSX DC ICD implanted 11/09/14 (Followed by [facility])  Formatting of this note might be different from the original. 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 She initially underwent aortic valve commissurotomy in 1985 with subsequent aortic root replacement with a porcine valve and ascending aorta and hemiarch replacement in 2006. She subsequently developed severe symptomatic prosthetic aortic valve regurgitation and  underwent successful valve and valve transcatheter aortic valve replacement with a   Unspecified dyspareunia (CODE) 11/23/2020   Past Surgical History:  Procedure Laterality Date   ANOMALOUS PULMONARY VENOUS RETURN REPAIR, TOTAL  2015    AORTIC VALVE REPAIR  1985   AORTIC VALVE REPLACEMENT  2006   aortic valve/aortic root replacement with porcine valve, and extending aorta and hemi-arch replacement /notes 06/07/2017   [place]; 2006; 2015   "prior to my ORs those years"   [place]  2015   CARDIAC VALVE REPLACEMENT     CESAREAN SECTION  2008   LEFT HEART CATH AND CORONARY ANGIOGRAPHY N/A 06/08/2017   Procedure: Left Heart Cath and Coronary Angiography;  Surgeon: Belva Crome, MD;  Location: [place] CV LAB;  Service: Cardiovascular;  Laterality: N/A;     Current Outpatient Medications  Medication Sig Dispense Refill   acetaminophen (TYLENOL) 325 MG tablet Take 650 mg by mouth 2 (two) times daily as needed for moderate pain or headache.      aspirin EC 81 MG tablet Take 81 mg by mouth daily.     atorvastatin (LIPITOR) 80 MG tablet Take 1 tablet (80 mg total) by mouth daily. 90 tablet 3   diclofenac sodium (VOLTAREN) 1 % GEL Apply topically as needed.     furosemide (LASIX) 20 MG tablet TAKE 2 TABLETS BY MOUTH ONCE DAILY. 60 tablet 5   Glucosamine 500 MG CAPS Take 2 capsules by mouth daily.     metoprolol tartrate (LOPRESSOR) 50 MG tablet TAKE 1 TABLET BY MOUTH TWICE DAILY. 180 tablet 3   mexiletine (MEXITIL) 250 MG capsule Take 1 capsule (250 mg total) by mouth 2 (two) times daily. 180 capsule 2   nitroGLYCERIN (NITROSTAT) 0.4 MG SL tablet PLACE 1 TABLET UNDER THE TONGUE EVERY 5 MINUTES FOR 3 DOSES AS NEEDED FOR CHEST PAIN 25 tablet 11   potassium chloride (KLOR-CON) 10 MEQ tablet Take 1 tablet (10 mEq total) by mouth as needed. 90 tablet 1   warfarin (COUMADIN) 5 MG tablet Take 2  tablets by mouth daily or as directed 180 tablet 0   No current facility-administered medications for this visit.    Allergies:   Tape   Social History:  The patient  reports that she has never smoked. She has never used smokeless tobacco. She reports that she does  not drink alcohol and does not use drugs.   Family History:  The patient's family history includes Hyperlipidemia in her father; Hypertension in her mother; Leukemia in her father.   ROS:  Please see the history of present illness.   Otherwise, review of systems is positive for none.   All other systems are reviewed and negative.   PHYSICAL EXAM: VS:  BP 134/80   Pulse 75   Ht 5' 1.25" (1.556 m)   Wt (!) 345 lb (156.5 kg)   SpO2 95%   BMI 64.66 kg/m  , BMI Body mass index is 64.66 kg/m. GEN: Well nourished, well developed, in no acute distress  HEENT: normal  Neck: no JVD, carotid bruits, or masses Cardiac: RRR; no murmurs, rubs, or gallops,no edema  Respiratory:  clear to auscultation bilaterally, normal work of breathing GI: soft, nontender, nondistended, + BS MS: no deformity or atrophy  Skin: warm and dry, device site well healed Neuro:  Strength and sensation are intact Psych: euthymic mood, full affect  EKG:  EKG is ordered today. Personal  review of the ekg ordered shows atrial paced, rate 75, lateral T wave inversions  Personal review of the device interrogation today. Results in [place]: No results found for requested labs within last 8760 hours.    Lipid Panel     Component Value Date/Time   CHOL 175 11/25/2020 1014   TRIG 207 (H) 11/25/2020 1014   HDL 49 11/25/2020 1014   CHOLHDL 3.6 11/25/2020 1014   LDLCALC 91 11/25/2020 1014     Wt Readings from Last 3 Encounters:  06/06/21 (!) 345 lb (156.5 kg)  11/25/20 (!) 343 lb (155.6 kg)  08/23/20 (!) 346 lb 12.8 oz (157.3 kg)      Other studies Reviewed: Additional studies/ records that were reviewed today include: TTE 11/01/18  Review of the above records today demonstrates:  - Left ventricle: The cavity size was normal. There was moderate   concentric hypertrophy. Systolic function was normal. The   estimated ejection fraction was in the range of 55% to 65%. Wall   motion was normal;  there were no regional wall motion   abnormalities. - Aortic valve: Valve area (VTI): 1.4 cm^2. Valve area (Vmax): 1.37   cm^2. Valve area (Vmean): 1.19 cm^2. - Left atrium: The atrium was mildly dilated.   ASSESSMENT AND PLAN:  1.  VT arrest: Status post [employer] ICD implanted in 2015.  Device functioning appropriately.  Has been started on mexiletine for recurrent VT. high risk medication monitoring.  Only short episodes on mexiletine.  No changes.  2.  Aortic valve/aortic root replacement: Currently followed by Agustin Cree.  No changes.  3.  Hypertension: Currently well controlled  4.  Obesity: Diet and exercise encouraged  5.  Obstructive sleep apnea: CPAP compliance encouraged  6.  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  Currently asymptomatic.   Current medicines are reviewed at length with the patient today.   The patient does not have concerns regarding her medicines.  The following changes were made today: None  Labs/ tests ordered today include:  Orders Placed This Encounter  Procedures   EKG 12-Lead      Disposition:   FU with Travius Crochet 12 months  Signed, Chundra Sauerwein Meredith Leeds, MD  06/06/2021 12:21 PM     [address] [place] [address] [phone] (office) [phone] (fax)

## 2021-06-06 ENCOUNTER — Encounter: Payer: Self-pay | Admitting: Cardiology

## 2021-06-06 ENCOUNTER — Other Ambulatory Visit: Payer: Self-pay

## 2021-06-06 ENCOUNTER — Ambulatory Visit (INDEPENDENT_AMBULATORY_CARE_PROVIDER_SITE_OTHER): Payer: 59 | Admitting: Cardiology

## 2021-06-06 VITALS — BP 134/80 | HR 75 | Ht 61.25 in | Wt 345.0 lb

## 2021-06-06 DIAGNOSIS — I472 Ventricular tachycardia, unspecified: Secondary | ICD-10-CM

## 2021-06-14 ENCOUNTER — Ambulatory Visit (INDEPENDENT_AMBULATORY_CARE_PROVIDER_SITE_OTHER): Payer: 59

## 2021-06-14 ENCOUNTER — Other Ambulatory Visit: Payer: Self-pay

## 2021-06-14 DIAGNOSIS — Z952 Presence of prosthetic heart valve: Secondary | ICD-10-CM

## 2021-06-14 DIAGNOSIS — Z86711 Personal history of pulmonary embolism: Secondary | ICD-10-CM

## 2021-06-14 DIAGNOSIS — Z5181 Encounter for therapeutic drug level monitoring: Secondary | ICD-10-CM

## 2021-06-14 LAB — POCT INR: INR: 2.1 (ref 2.0–3.0)

## 2021-06-14 NOTE — Patient Instructions (Signed)
continue 2 tablets daily.  Recheck INR in 6 weeks at the [place] office. Please call the office with any medication changes or additions [phone].

## 2021-06-24 NOTE — Progress Notes (Signed)
Remote ICD transmission.   

## 2021-07-14 ENCOUNTER — Telehealth: Payer: Self-pay | Admitting: Cardiology

## 2021-07-14 ENCOUNTER — Other Ambulatory Visit: Payer: Self-pay | Admitting: Cardiology

## 2021-07-14 DIAGNOSIS — Z952 Presence of prosthetic heart valve: Secondary | ICD-10-CM

## 2021-07-14 NOTE — Telephone Encounter (Signed)
*  STAT* If patient is at the pharmacy, call can be transferred to refill team.   1. Which medications need to be refilled? (please list name of each medication and dose if known)  warfarin (COUMADIN) 5 MG tablet  2. Which pharmacy/location (including street and city if local pharmacy) is medication to be sent to? [place], [place].  3. Do they need a 30 day or 90 day supply? 90 with refills    Patient is out of medication

## 2021-07-14 NOTE — Telephone Encounter (Signed)
Refilled already in another encounter today

## 2021-08-02 ENCOUNTER — Other Ambulatory Visit: Payer: Self-pay

## 2021-08-02 ENCOUNTER — Ambulatory Visit: Payer: 59

## 2021-08-02 DIAGNOSIS — Z952 Presence of prosthetic heart valve: Secondary | ICD-10-CM | POA: Diagnosis not present

## 2021-08-02 DIAGNOSIS — Z86711 Personal history of pulmonary embolism: Secondary | ICD-10-CM | POA: Diagnosis not present

## 2021-08-02 DIAGNOSIS — Z5181 Encounter for therapeutic drug level monitoring: Secondary | ICD-10-CM | POA: Diagnosis not present

## 2021-08-02 LAB — POCT INR: INR: 2.9 (ref 2.0–3.0)

## 2021-08-02 NOTE — Patient Instructions (Signed)
continue 2 tablets daily.  Recheck INR in 6 weeks at the [place] office. Please call the office with any medication changes or additions [phone].

## 2021-09-01 ENCOUNTER — Ambulatory Visit (INDEPENDENT_AMBULATORY_CARE_PROVIDER_SITE_OTHER): Payer: 59

## 2021-09-01 DIAGNOSIS — I469 Cardiac arrest, cause unspecified: Secondary | ICD-10-CM | POA: Diagnosis not present

## 2021-09-01 LAB — CUP PACEART REMOTE DEVICE CHECK
Battery Remaining Longevity: 90 mo
Battery Remaining Percentage: 98 %
Brady Statistic RA Percent Paced: 47 %
Brady Statistic RV Percent Paced: 0 %
Date Time Interrogation Session: 20221020032700
HighPow Impedance: 80 Ohm
Implantable Lead Implant Date: 20151228
Implantable Lead Implant Date: 20151228
Implantable Lead Location: 753859
Implantable Lead Location: 753860
Implantable Lead Model: 292
Implantable Lead Model: 5076
Implantable Lead Serial Number: 358765
Implantable Pulse Generator Implant Date: 20151228
Lead Channel Impedance Value: 496 Ohm
Lead Channel Impedance Value: 502 Ohm
Lead Channel Pacing Threshold Amplitude: 0.9 V
Lead Channel Pacing Threshold Amplitude: 1 V
Lead Channel Pacing Threshold Pulse Width: 0.4 ms
Lead Channel Pacing Threshold Pulse Width: 0.4 ms
Lead Channel Setting Pacing Amplitude: 2 V
Lead Channel Setting Pacing Amplitude: 2.4 V
Lead Channel Setting Pacing Pulse Width: 0.4 ms
Lead Channel Setting Sensing Sensitivity: 0.5 mV
Pulse Gen Serial Number: 508420

## 2021-09-09 ENCOUNTER — Other Ambulatory Visit: Payer: Self-pay | Admitting: Cardiology

## 2021-09-09 NOTE — Progress Notes (Signed)
Remote ICD transmission.   

## 2021-09-12 ENCOUNTER — Other Ambulatory Visit: Payer: Self-pay

## 2021-09-13 ENCOUNTER — Other Ambulatory Visit: Payer: Self-pay

## 2021-09-13 ENCOUNTER — Ambulatory Visit: Payer: 59

## 2021-09-13 ENCOUNTER — Encounter: Payer: Self-pay | Admitting: Cardiology

## 2021-09-13 ENCOUNTER — Ambulatory Visit: Payer: 59 | Admitting: Cardiology

## 2021-09-13 VITALS — BP 126/76 | HR 76 | Ht 61.25 in | Wt 343.4 lb

## 2021-09-13 DIAGNOSIS — E7800 Pure hypercholesterolemia, unspecified: Secondary | ICD-10-CM | POA: Diagnosis not present

## 2021-09-13 DIAGNOSIS — Z95810 Presence of automatic (implantable) cardiac defibrillator: Secondary | ICD-10-CM | POA: Diagnosis not present

## 2021-09-13 DIAGNOSIS — Z86711 Personal history of pulmonary embolism: Secondary | ICD-10-CM | POA: Diagnosis not present

## 2021-09-13 DIAGNOSIS — Z952 Presence of prosthetic heart valve: Secondary | ICD-10-CM | POA: Diagnosis not present

## 2021-09-13 DIAGNOSIS — Z5181 Encounter for therapeutic drug level monitoring: Secondary | ICD-10-CM

## 2021-09-13 LAB — LIPID PANEL
Chol/HDL Ratio: 3.4 ratio (ref 0.0–4.4)
Cholesterol, Total: 165 mg/dL (ref 100–199)
HDL: 49 mg/dL (ref >39)
LDL Chol Calc (NIH): 80 mg/dL (ref 0–99)
Triglycerides: 219 mg/dL — ABNORMAL HIGH (ref 0–149)
VLDL Cholesterol Cal: 36 mg/dL (ref 5–40)

## 2021-09-13 LAB — POCT INR: INR: 2.2 (ref 2.0–3.0)

## 2021-09-13 LAB — AST: AST: 45 IU/L — ABNORMAL HIGH (ref 0–40)

## 2021-09-13 LAB — ALT: ALT: 37 IU/L — ABNORMAL HIGH (ref 0–32)

## 2021-09-13 MED ORDER — POTASSIUM CHLORIDE ER 10 MEQ PO TBCR: 10.0000 meq | EXTENDED_RELEASE_TABLET | ORAL | 1 refills | Status: DC

## 2021-09-13 MED ORDER — FUROSEMIDE 20 MG PO TABS: 20.0000 mg | ORAL_TABLET | Freq: Every day | ORAL | 3 refills | Status: DC

## 2021-09-13 MED ORDER — DAPAGLIFLOZIN PROPANEDIOL 10 MG PO TABS: 10.0000 mg | ORAL_TABLET | Freq: Every day | ORAL | 1 refills | Status: DC

## 2021-09-13 NOTE — Patient Instructions (Signed)
Description   Continue 2 tablets daily. Recheck INR in 6 weeks at the [place] office.  Please call the office with any medication changes or additions [phone].       

## 2021-09-13 NOTE — Progress Notes (Signed)
Cardiology Office Note:    Date:  09/13/2021   ID:  Heather Mosley, DOB 12-20-1965, MRN [id number]  PCP:  Mateo Flow, MD  Cardiologist:  Jenne Campus, MD    Referring MD: Mateo Flow, MD   Chief Complaint  Patient presents with   Follow-up  Doing fine  History of Present Illness:    Heather Mosley is a 55 y.o. female    with complex past medical history which include aortic root replacement initially with bioprosthesis after data TAVR implanted that failed eventually she ended up getting mechanical Saint Jude valve 21 mm done on July 24, 2017 at [facility].  Also during the process of all this problem she had cardiac arrest, pulmonary emboli required ICD. She comes today 2 months of follow-up.  Overall she is doing very well.  Denies have any chest pain tightness squeezing pressure burning chest overall things are looking good  Past Medical History:  Diagnosis Date   Acute on chronic systolic ACC/AHA stage C congestive heart failure ([place]) 11/09/2014   AICD (automatic cardioverter/defibrillator) present 2015   AKI (acute kidney injury) ([place]) 07/24/2017   Aortic valvular stenoses    "born w/it"   Arthritis    "knees" (06/07/2017)   Cardiac arrest ([place]) 11/03/2014   Chest pain 06/07/2017   Congenital heart defect    Encounter for therapeutic drug monitoring 09/11/2017   Exertional dyspnea 07/16/2017   Heart murmur    High cholesterol    History of biliary T-tube placement 11/09/2014   Overview:  Overview:  BSX DC ICD implanted 11/09/14 (Followed by [facility])  Formatting of this note might be different from the original. Overview:  Overview:  BSX DC ICD implanted 11/09/14 (Followed by [facility]) Formatting of this note might be different from the original. Overview:  BSX DC ICD implanted 11/09/14 (Followed by [facility])   History of cardiac arrest 06/06/2017   History of pulmonary embolism 06/06/2017   Hypertension    Hypoxemia 07/24/2017   ICD (implantable  cardioverter-defibrillator) in place 05/24/2016   Lactate blood increased 07/24/2017   Nonrheumatic aortic valve insufficiency 09/18/2014   Obesity 06/02/2015   OSA on CPAP    Pulmonary embolism ([place]) 2015   Right ventricular systolic dysfunction 1/66/0630   S/P AVR (aortic valve replacement) 07/27/2017   St Jude Valve 21 mm - 07/24/2017 [facility]   S/P ICD (internal cardiac defibrillator) procedure 11/09/2014   Overview:  BSX DC ICD implanted 11/09/14 (Followed by [facility])  Formatting of this note might be different from the original. 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 She initially underwent aortic valve commissurotomy in 1985 with subsequent aortic root replacement with a porcine valve and ascending aorta and hemiarch replacement in 2006. She subsequently developed severe symptomatic prosthetic aortic valve regurgitation and underwent successful valve and valve transcatheter aortic valve replacement with a   Unspecified dyspareunia (CODE) 11/23/2020    Past Surgical History:  Procedure Laterality Date   ANOMALOUS PULMONARY VENOUS RETURN REPAIR, TOTAL  2015   AORTIC VALVE REPAIR  1985   AORTIC VALVE REPLACEMENT  2006   aortic valve/aortic root replacement with porcine valve, and extending aorta and hemi-arch replacement /notes 06/07/2017   [place];  2006; 2015   "prior to my ORs those years"   [place]  2015   CARDIAC VALVE REPLACEMENT     CESAREAN SECTION  2008   LEFT HEART CATH AND CORONARY ANGIOGRAPHY N/A 06/08/2017   Procedure: Left Heart Cath and Coronary Angiography;  Surgeon: Belva Crome, MD;  Location: [place] CV LAB;  Service:  Cardiovascular;  Laterality: N/A;    Current Medications: Current Meds  Medication Sig   acetaminophen (TYLENOL) 325 MG tablet Take 650 mg by mouth 2 (two) times daily as needed for moderate pain or headache.    aspirin EC 81 MG tablet Take 81 mg by mouth daily.   atorvastatin (LIPITOR) 80 MG tablet Take 1 tablet (80 mg total) by mouth daily.   furosemide (LASIX) 20 MG tablet TAKE 2 TABLETS BY MOUTH ONCE DAILY. (Patient taking differently: Take 40 mg by mouth as needed for fluid or edema.)   Glucosamine 500 MG CAPS Take 2 capsules by mouth daily.   metoprolol tartrate (LOPRESSOR) 50 MG tablet TAKE 1 TABLET BY MOUTH TWICE DAILY. (Patient taking differently: Take 50 mg by mouth 2 (two) times daily.)   mexiletine (MEXITIL) 250 MG capsule TAKE 1 CAPSULE(250 MG) BY MOUTH TWICE DAILY (Patient taking differently: Take 250 mg by mouth 2 (two) times daily.)   nitroGLYCERIN (NITROSTAT) 0.4 MG SL tablet PLACE 1 TABLET UNDER THE TONGUE EVERY 5 MINUTES FOR 3 DOSES AS NEEDED FOR CHEST PAIN (Patient taking differently: Place 0.4 mg under the tongue every 5 (five) minutes as needed for chest pain.)   potassium chloride (KLOR-CON) 10 MEQ tablet Take 1 tablet (10 mEq total) by mouth as needed. (Patient taking differently: Take 10 mEq by mouth as needed (take with Furosemide).)   warfarin (COUMADIN) 5 MG tablet TAKE 2 TABLETS BY MOUTH ONCE DAILY OR ASDIRECTED (Patient taking differently: Take 5 mg by mouth See admin instructions. TAKE 2 TABLETS BY MOUTH ONCE DAILY OR ASDIRECTED)     Allergies:   Tape   Social History   Socioeconomic History   Marital status: Married    Spouse name: Not on file   Number of children: Not on file   Years of education: Not on file   Highest education level: Not on file  Occupational History   Not on file  Tobacco Use   Smoking status: Never   Smokeless tobacco: Never  Vaping Use   Vaping Use: Never used  Substance and Sexual Activity   Alcohol use: No   Drug use: No    Sexual activity: Yes  Other Topics Concern   Not on file  Social History Narrative   Not on file   Social Determinants of Health   Financial Resource Strain: Not on file  Food Insecurity: Not on file  Transportation Needs: Not on file  Physical Activity: Not on file  Stress: Not on file  Social Connections: Not on file     Family History: The patient's family history includes Hyperlipidemia in her father; Hypertension in her mother; Leukemia in her father. ROS:   Please see the history of present illness.    All 14 point review of systems negative except as described per history of present illness  EKGs/Labs/Other Studies Reviewed:      Recent Labs: No results found for requested labs within last 8760 hours.  Recent Lipid Panel    Component Value Date/Time   CHOL 175 11/25/2020 1014   TRIG 207 (H) 11/25/2020 1014   HDL 49 11/25/2020  1014   CHOLHDL 3.6 11/25/2020 1014   LDLCALC 91 11/25/2020 1014    Physical Exam:    VS:  BP 126/76 (BP Location: Left Arm, Patient Position: Sitting)   Pulse 76   Ht 5' 1.25" (1.556 m)   Wt (!) 343 lb 6.4 oz (155.8 kg)   SpO2 97%   BMI 64.36 kg/m     Wt Readings from Last 3 Encounters:  09/13/21 (!) 343 lb 6.4 oz (155.8 kg)  06/06/21 (!) 345 lb (156.5 kg)  11/25/20 (!) 343 lb (155.6 kg)     GEN:  Well nourished, well developed in no acute distress HEENT: Normal NECK: No JVD; No carotid bruits LYMPHATICS: No lymphadenopathy CARDIAC: RRR, crisp mechanical valve sounds., no rubs, no gallops RESPIRATORY:  Clear to auscultation without rales, wheezing or rhonchi  ABDOMEN: Soft, non-tender, non-distended MUSCULOSKELETAL:  No edema; No deformity  SKIN: Warm and dry LOWER EXTREMITIES: no swelling NEUROLOGIC:  Alert and oriented x 3 PSYCHIATRIC:  Normal affect   ASSESSMENT:    1. S/P AVR (aortic valve replacement)   2. S/P TAVR (transcatheter aortic valve replacement)   3. S/P ICD (internal cardiac defibrillator) procedure    4. ICD (implantable cardioverter-defibrillator) in place    PLAN:    In order of problems listed above:  Status post aortic valve replacement, that was done 3 years after TAVR implantation.  Overall she is doing very well from that point review we will schedule her to have an echocardiogram echocardiogram will happen in February.  She denies have any signs and symptoms of swelling follow-up car also valve sounds crisp. ICD present is followed by EP team, I did review interrogation stable function. Morbid obesity sadly still a problem she is struggling with this issue all her life trying to be elevated more active hopefully that will help. Obstructive sleep apnea which is quite significant.  She does have some difficulty with equipment she is waiting for new BiPAP mask. Frequent ventricular ectopy, status post cardiac arrest.  She is on mexiletine as well as beta-blocker which I will continue. Dyslipidemia: We will check her fasting lipid profile today.  I did review her K PN which show me her LDL of 91 HDL 49 this is from January 2022.  She does not have coronary artery disease.   Medication Adjustments/Labs and Tests Ordered: Current medicines are reviewed at length with the patient today.  Concerns regarding medicines are outlined above.  No orders of the defined types were placed in this encounter.  Medication changes: No orders of the defined types were placed in this encounter.   Signed, Park Liter, MD, [facility] 09/13/2021 9:37 AM    [place]

## 2021-09-13 NOTE — Patient Instructions (Signed)
Medication Instructions:  Your physician recommends that you continue on your current medications as directed. Please refer to the Current Medication list given to you today.  *If you need a refill on your cardiac medications before your next appointment, please call your pharmacy*   Lab Work: Your physician recommends that you return for lab work in: Today for lipid panel, ALT & AST  If you have labs (blood work) drawn today and your tests are completely normal, you will receive your results only by: [place] (if you have MyChart) OR A paper copy in the mail If you have any lab test that is abnormal or we need to change your treatment, we will call you to review the results.   Testing/Procedures: Your physician has requested that you have an echocardiogram. Echocardiography is a painless test that uses sound waves to create images of your heart. It provides your doctor with information about the size and shape of your heart and how well your heart's chambers and valves are working. This procedure takes approximately one hour. There are no restrictions for this procedure.    Follow-Up: At [facility], you and your health needs are our priority.  As part of our continuing mission to provide you with exceptional heart care, we have created designated Provider Care Teams.  These Care Teams include your primary Cardiologist (physician) and Advanced Practice Providers (APPs -  Physician Assistants and Nurse Practitioners) who all work together to provide you with the care you need, when you need it.  We recommend signing up for the patient portal called "MyChart".  Sign up information is provided on this After Visit Summary.  MyChart is used to connect with patients for Virtual Visits (Telemedicine).  Patients are able to view lab/test results, encounter notes, upcoming appointments, etc.  Non-urgent messages can be sent to your provider as well.   To learn more about what you can do with  MyChart, go to [email or web address].    Your next appointment:   1 year(s)  The format for your next appointment:   In Person  Provider:   Jenne Campus, MD   Other Instructions

## 2021-09-15 ENCOUNTER — Telehealth: Payer: Self-pay | Admitting: Cardiology

## 2021-09-15 NOTE — Telephone Encounter (Signed)
Pt is returning call.  

## 2021-09-15 NOTE — Telephone Encounter (Signed)
Patient informed of results.  

## 2021-10-10 ENCOUNTER — Other Ambulatory Visit: Payer: Self-pay | Admitting: Cardiology

## 2021-10-10 DIAGNOSIS — Z952 Presence of prosthetic heart valve: Secondary | ICD-10-CM

## 2021-10-21 ENCOUNTER — Telehealth: Payer: Self-pay

## 2021-10-21 NOTE — Telephone Encounter (Signed)
Patient called to follow up on remote transmission sent last evening for symptoms of AF/AFL. Known history. Transmission reviewed. No AT/AF episodes noted yesterday however patient was having PACs/PVCs. Multiple Rythmiq episodes with 1 EGM. Patient states she was feeling fluttering" in her throat which has indicated AF in the past. She began to notice these symptoms around 3:30 pm yesterday and they continued intermittently throughout the night. States they are improved this am, occurring occasionally. Patient is compliant with lopressor and mexiletine however did miss her dose of metoprolol Wednesday night and consumed caffeine throughout the morning yesterday. Provided patient reassurance that she was not in AT/AF and to continue medications as prescribed. She will monitor symptoms and contact device clinic if symptoms do not resolve by Monday.   PRESENTING   RYTHMIQ EGM

## 2021-10-21 NOTE — Telephone Encounter (Signed)
The patient called asking to speak with the nurse. I let her speak with Portia, rn.

## 2021-10-24 ENCOUNTER — Telehealth: Payer: Self-pay

## 2021-10-24 NOTE — Telephone Encounter (Signed)
Successful telephone encounter to patient to follow up on incoming call. Patient states she is doing well this morning. Feels PVCs and PACs have resolved as she is no longer feeling palpitations or fluttering. She was asked to send manual transmission however she is currently at work. She continue to be compliant with her medications and has not missed another dose of her lopressor. Patient instructed to call device clinic if symptoms return. Patient appreciative of care.

## 2021-10-24 NOTE — Telephone Encounter (Signed)
The patient left a voicemail to follow up on the conversation she had with Portia on Friday. She states she ws still having issues over the weekend. She would like for the nurse to give her a call back because she has some questions.

## 2021-10-25 ENCOUNTER — Other Ambulatory Visit: Payer: Self-pay | Admitting: Obstetrics and Gynecology

## 2021-10-25 ENCOUNTER — Other Ambulatory Visit: Payer: Self-pay

## 2021-10-25 ENCOUNTER — Ambulatory Visit: Payer: 59

## 2021-10-25 DIAGNOSIS — Z952 Presence of prosthetic heart valve: Secondary | ICD-10-CM

## 2021-10-25 DIAGNOSIS — Z5181 Encounter for therapeutic drug level monitoring: Secondary | ICD-10-CM | POA: Diagnosis not present

## 2021-10-25 DIAGNOSIS — Z86711 Personal history of pulmonary embolism: Secondary | ICD-10-CM | POA: Diagnosis not present

## 2021-10-25 DIAGNOSIS — Z1231 Encounter for screening mammogram for malignant neoplasm of breast: Secondary | ICD-10-CM

## 2021-10-25 LAB — POCT INR: INR: 2.1 (ref 2.0–3.0)

## 2021-10-25 NOTE — Patient Instructions (Signed)
Continue 2 tablets daily.  Recheck INR in 6 weeks at the [place] office. Please call the office with any medication changes or additions [phone].

## 2021-11-30 ENCOUNTER — Ambulatory Visit
Admission: RE | Admit: 2021-11-30 | Discharge: 2021-11-30 | Disposition: A | Discharge status: Home / Self Care | Payer: 59 | Source: Ambulatory Visit | Attending: Obstetrics and Gynecology | Admitting: Obstetrics and Gynecology

## 2021-11-30 DIAGNOSIS — Z1231 Encounter for screening mammogram for malignant neoplasm of breast: Secondary | ICD-10-CM

## 2021-12-01 ENCOUNTER — Ambulatory Visit (INDEPENDENT_AMBULATORY_CARE_PROVIDER_SITE_OTHER): Payer: 59

## 2021-12-01 DIAGNOSIS — Z95810 Presence of automatic (implantable) cardiac defibrillator: Secondary | ICD-10-CM

## 2021-12-01 LAB — CUP PACEART REMOTE DEVICE CHECK
Battery Remaining Longevity: 84 mo
Battery Remaining Percentage: 95 %
Brady Statistic RA Percent Paced: 50 %
Brady Statistic RV Percent Paced: 0 %
Date Time Interrogation Session: 20230119030100
HighPow Impedance: 85 Ohm
Implantable Lead Implant Date: 20151228
Implantable Lead Implant Date: 20151228
Implantable Lead Location: 753859
Implantable Lead Location: 753860
Implantable Lead Model: 292
Implantable Lead Model: 5076
Implantable Lead Serial Number: 358765
Implantable Pulse Generator Implant Date: 20151228
Lead Channel Impedance Value: 478 Ohm
Lead Channel Impedance Value: 546 Ohm
Lead Channel Pacing Threshold Amplitude: 0.9 V
Lead Channel Pacing Threshold Amplitude: 1 V
Lead Channel Pacing Threshold Pulse Width: 0.4 ms
Lead Channel Pacing Threshold Pulse Width: 0.4 ms
Lead Channel Setting Pacing Amplitude: 2 V
Lead Channel Setting Pacing Amplitude: 2.4 V
Lead Channel Setting Pacing Pulse Width: 0.4 ms
Lead Channel Setting Sensing Sensitivity: 0.5 mV
Pulse Gen Serial Number: 508420

## 2021-12-06 ENCOUNTER — Other Ambulatory Visit: Payer: Self-pay

## 2021-12-06 ENCOUNTER — Ambulatory Visit (INDEPENDENT_AMBULATORY_CARE_PROVIDER_SITE_OTHER): Payer: 59

## 2021-12-06 DIAGNOSIS — Z5181 Encounter for therapeutic drug level monitoring: Secondary | ICD-10-CM | POA: Diagnosis not present

## 2021-12-06 DIAGNOSIS — Z952 Presence of prosthetic heart valve: Secondary | ICD-10-CM

## 2021-12-06 DIAGNOSIS — Z86711 Personal history of pulmonary embolism: Secondary | ICD-10-CM | POA: Diagnosis not present

## 2021-12-06 LAB — POCT INR: INR: 2.8 (ref 2.0–3.0)

## 2021-12-06 NOTE — Patient Instructions (Signed)
Description   Continue 2 tablets daily. Recheck INR in 6 weeks at the [place] office.  Please call the office with any medication changes or additions [phone].       

## 2021-12-08 ENCOUNTER — Telehealth: Payer: Self-pay

## 2021-12-08 NOTE — Telephone Encounter (Signed)
Patient calling in requesting RN review most recent manual transmission as she feel she is having irregular heart rates. Transmission reviewed. No episodes noted. Patient continues to have PACs. Reassurance provided. Patient appreciative of review.

## 2021-12-14 ENCOUNTER — Other Ambulatory Visit: Payer: 59

## 2021-12-14 NOTE — Progress Notes (Signed)
Remote ICD transmission.   

## 2021-12-26 DIAGNOSIS — G4733 Obstructive sleep apnea (adult) (pediatric): Secondary | ICD-10-CM | POA: Diagnosis not present

## 2021-12-27 DIAGNOSIS — G4733 Obstructive sleep apnea (adult) (pediatric): Secondary | ICD-10-CM | POA: Diagnosis not present

## 2021-12-28 ENCOUNTER — Other Ambulatory Visit: Payer: Self-pay

## 2021-12-28 ENCOUNTER — Ambulatory Visit (INDEPENDENT_AMBULATORY_CARE_PROVIDER_SITE_OTHER): Payer: BC Managed Care – PPO

## 2021-12-28 DIAGNOSIS — Z952 Presence of prosthetic heart valve: Secondary | ICD-10-CM | POA: Diagnosis not present

## 2021-12-28 LAB — ECHOCARDIOGRAM COMPLETE
AR max vel: 0.95 cm2
AV Area VTI: 1.05 cm2
AV Area mean vel: 0.92 cm2
AV Mean grad: 23.5 mmHg
AV Peak grad: 42.3 mmHg
Ao pk vel: 3.25 m/s
Area-P 1/2: 2.83 cm2
S' Lateral: 2.3 cm

## 2021-12-28 MED ORDER — PERFLUTREN LIPID MICROSPHERE
1.0000 mL | INTRAVENOUS | Status: AC | PRN
  Administered 2021-12-28: 4 mL via INTRAVENOUS

## 2022-01-03 ENCOUNTER — Other Ambulatory Visit: Payer: Self-pay | Admitting: Cardiology

## 2022-01-03 DIAGNOSIS — Z952 Presence of prosthetic heart valve: Secondary | ICD-10-CM

## 2022-01-03 NOTE — Telephone Encounter (Signed)
Prescription refill request received for warfarin Lov: 09/13/2021 Next INR check: 3/7 Warfarin tablet strength: 5mg 

## 2022-01-04 ENCOUNTER — Telehealth: Payer: Self-pay

## 2022-01-04 NOTE — Telephone Encounter (Signed)
-----   Message from Park Liter, MD sent at 01/03/2022 10:35 AM EST ----- Left ventricle ejection fraction is normal, gradient across the prosthetic valve is identical to before.  Unchanged.  Overall looks good

## 2022-01-04 NOTE — Telephone Encounter (Signed)
Patient notified of results, she mention Dr. Curt Bears office said her pacemaker recordings was showing episodes of PAC's ongoing for 4 months now. Dr. Curt Bears office said nothing to be concern at this moment but she want me to run this by you and see what your thought about this. Message sent to Dr. Raliegh Ip

## 2022-01-06 NOTE — Telephone Encounter (Signed)
Patient notified of the following per Dr. K 

## 2022-01-06 NOTE — Telephone Encounter (Signed)
Patient returned call

## 2022-01-06 NOTE — Telephone Encounter (Signed)
Per Dr. Raliegh Ip stated the following: This is  not dangerous.  We can just simply monitoring it    I called and LM to return my call.

## 2022-01-17 ENCOUNTER — Ambulatory Visit: Payer: BC Managed Care – PPO

## 2022-01-17 ENCOUNTER — Other Ambulatory Visit: Payer: Self-pay

## 2022-01-17 DIAGNOSIS — Z5181 Encounter for therapeutic drug level monitoring: Secondary | ICD-10-CM | POA: Diagnosis not present

## 2022-01-17 DIAGNOSIS — Z86711 Personal history of pulmonary embolism: Secondary | ICD-10-CM

## 2022-01-17 DIAGNOSIS — Z952 Presence of prosthetic heart valve: Secondary | ICD-10-CM

## 2022-01-17 LAB — POCT INR: INR: 2.7 (ref 2.0–3.0)

## 2022-01-17 NOTE — Patient Instructions (Signed)
Description   Continue 2 tablets daily. Recheck INR in 6 weeks at the [place] office.  Please call the office with any medication changes or additions [phone].       

## 2022-01-23 ENCOUNTER — Other Ambulatory Visit: Payer: Self-pay | Admitting: Cardiology

## 2022-01-24 DIAGNOSIS — G4733 Obstructive sleep apnea (adult) (pediatric): Secondary | ICD-10-CM | POA: Diagnosis not present

## 2022-01-25 DIAGNOSIS — G4733 Obstructive sleep apnea (adult) (pediatric): Secondary | ICD-10-CM | POA: Diagnosis not present

## 2022-02-24 DIAGNOSIS — G4733 Obstructive sleep apnea (adult) (pediatric): Secondary | ICD-10-CM | POA: Diagnosis not present

## 2022-02-28 ENCOUNTER — Ambulatory Visit: Payer: BC Managed Care – PPO

## 2022-02-28 DIAGNOSIS — Z952 Presence of prosthetic heart valve: Secondary | ICD-10-CM | POA: Diagnosis not present

## 2022-02-28 DIAGNOSIS — Z5181 Encounter for therapeutic drug level monitoring: Secondary | ICD-10-CM

## 2022-02-28 DIAGNOSIS — Z86711 Personal history of pulmonary embolism: Secondary | ICD-10-CM | POA: Diagnosis not present

## 2022-02-28 LAB — POCT INR: INR: 2.4 (ref 2.0–3.0)

## 2022-02-28 NOTE — Patient Instructions (Signed)
Description   Continue 2 tablets daily. Recheck INR in 7 weeks at the [place] office. Please call the office with any medication changes or additions [phone].       

## 2022-03-02 ENCOUNTER — Ambulatory Visit (INDEPENDENT_AMBULATORY_CARE_PROVIDER_SITE_OTHER): Payer: BC Managed Care – PPO

## 2022-03-02 DIAGNOSIS — I472 Ventricular tachycardia, unspecified: Secondary | ICD-10-CM

## 2022-03-02 LAB — CUP PACEART REMOTE DEVICE CHECK
Battery Remaining Longevity: 84 mo
Battery Remaining Percentage: 92 %
Brady Statistic RA Percent Paced: 52 %
Brady Statistic RV Percent Paced: 0 %
Date Time Interrogation Session: 20230420030100
HighPow Impedance: 85 Ohm
Implantable Lead Implant Date: 20151228
Implantable Lead Implant Date: 20151228
Implantable Lead Location: 753859
Implantable Lead Location: 753860
Implantable Lead Model: 292
Implantable Lead Model: 5076
Implantable Lead Serial Number: 358765
Implantable Pulse Generator Implant Date: 20151228
Lead Channel Impedance Value: 453 Ohm
Lead Channel Impedance Value: 512 Ohm
Lead Channel Pacing Threshold Amplitude: 0.9 V
Lead Channel Pacing Threshold Amplitude: 1 V
Lead Channel Pacing Threshold Pulse Width: 0.4 ms
Lead Channel Pacing Threshold Pulse Width: 0.4 ms
Lead Channel Setting Pacing Amplitude: 2 V
Lead Channel Setting Pacing Amplitude: 2.4 V
Lead Channel Setting Pacing Pulse Width: 0.4 ms
Lead Channel Setting Sensing Sensitivity: 0.5 mV
Pulse Gen Serial Number: 508420

## 2022-03-20 NOTE — Progress Notes (Signed)
Remote ICD transmission.   

## 2022-03-26 DIAGNOSIS — G4733 Obstructive sleep apnea (adult) (pediatric): Secondary | ICD-10-CM | POA: Diagnosis not present

## 2022-03-27 DIAGNOSIS — G4733 Obstructive sleep apnea (adult) (pediatric): Secondary | ICD-10-CM | POA: Diagnosis not present

## 2022-04-04 ENCOUNTER — Other Ambulatory Visit: Payer: Self-pay | Admitting: Cardiology

## 2022-04-04 ENCOUNTER — Telehealth: Payer: Self-pay | Admitting: Cardiology

## 2022-04-04 DIAGNOSIS — Z952 Presence of prosthetic heart valve: Secondary | ICD-10-CM

## 2022-04-04 NOTE — Telephone Encounter (Signed)
Pt c/o medication issue:  1. Name of Medication: mexiletine (MEXITIL) 250 MG capsule  2. How are you currently taking this medication (dosage and times per day)? As prescribed   3. Are you having a reaction (difficulty breathing--STAT)? No   4. What is your medication issue? Patient is calling stating her insurance has changed causing the cost of this medication to triple in cost. She is requesting a callback to discuss alternatives due to this. Please advise.

## 2022-04-05 NOTE — Telephone Encounter (Signed)
Patient called back. Copay went up to $105/90 days. Drug is on tier 2.  This is cheaper than good rx copay cards I will see if we can do a tier exception. Not confident that will get tier exception Will check with Dr. Curt Bears to see if he thinks changing therapy would be acceptable.

## 2022-04-05 NOTE — Telephone Encounter (Signed)
[facility] has a coupon for [facility] for $51.84/ month or [facility] for $48.35.  We can also try calling insurance to see why cost is so much and see if we can do a tier exception.  Is patient sure that pharmacy ran the prescription under her insurance?  There was no option for tier exception on covermymeds. Med available without PA.  Called pt and LVM for her to call back to discuss what the cost is.

## 2022-04-06 NOTE — Telephone Encounter (Signed)
Called and spoke to amy w/[employer] and they stated at tier exceptions aren't applicable to commercial plans and only to medicare. So this pt doesn't qualify. They can contact their plan and see if they are able to enroll in a specialty copay assistance at [phone].

## 2022-04-06 NOTE — Telephone Encounter (Signed)
Dr. Curt Bears- do you think its acceptable to change therapy? Or should we pursue assistance?

## 2022-04-06 NOTE — Telephone Encounter (Signed)
Called the pharmacy to obtain new insurance: [phone] Pcn- [id number] Help desk #: [phone]

## 2022-04-07 ENCOUNTER — Telehealth: Payer: Self-pay | Admitting: Cardiology

## 2022-04-07 DIAGNOSIS — I4891 Unspecified atrial fibrillation: Secondary | ICD-10-CM

## 2022-04-07 MED ORDER — METOPROLOL TARTRATE 50 MG PO TABS: 50.0000 mg | ORAL_TABLET | Freq: Two times a day (BID) | ORAL | 3 refills | Status: DC

## 2022-04-07 NOTE — Telephone Encounter (Signed)
Patient notified Rx sent

## 2022-04-07 NOTE — Telephone Encounter (Signed)
*  STAT* If patient is at the pharmacy, call can be transferred to refill team.   1. Which medications need to be refilled? (please list name of each medication and dose if known) metoprolol tartrate (LOPRESSOR) 50 MG tablet  2. Which pharmacy/location (including street and city if local pharmacy) is medication to be sent to? [place], [place]  3. Do they need a 30 day or 90 day supply? 90 day   Patient is out of medication.

## 2022-04-11 NOTE — Telephone Encounter (Signed)
I gave patient the phone # for specialty copay assistance. I advised her of Dr. Curt Bears message that he rather not change medication since she is stable, but if she cannot afford, can look at different options. I advised patient to call insurance and then let us know if she cannot afford.

## 2022-04-18 ENCOUNTER — Ambulatory Visit: Payer: BC Managed Care – PPO

## 2022-04-18 DIAGNOSIS — Z86711 Personal history of pulmonary embolism: Secondary | ICD-10-CM | POA: Diagnosis not present

## 2022-04-18 DIAGNOSIS — Z952 Presence of prosthetic heart valve: Secondary | ICD-10-CM | POA: Diagnosis not present

## 2022-04-18 DIAGNOSIS — Z5181 Encounter for therapeutic drug level monitoring: Secondary | ICD-10-CM | POA: Diagnosis not present

## 2022-04-18 LAB — POCT INR: INR: 2.7 (ref 2.0–3.0)

## 2022-04-18 NOTE — Patient Instructions (Signed)
Description   Continue 2 tablets daily. Recheck INR in 7 weeks at the [place] office. Please call the office with any medication changes or additions [phone].       

## 2022-04-26 DIAGNOSIS — G4733 Obstructive sleep apnea (adult) (pediatric): Secondary | ICD-10-CM | POA: Diagnosis not present

## 2022-05-24 DIAGNOSIS — G4733 Obstructive sleep apnea (adult) (pediatric): Secondary | ICD-10-CM | POA: Diagnosis not present

## 2022-05-26 DIAGNOSIS — G4733 Obstructive sleep apnea (adult) (pediatric): Secondary | ICD-10-CM | POA: Diagnosis not present

## 2022-06-01 ENCOUNTER — Ambulatory Visit (INDEPENDENT_AMBULATORY_CARE_PROVIDER_SITE_OTHER): Payer: BC Managed Care – PPO

## 2022-06-01 DIAGNOSIS — I469 Cardiac arrest, cause unspecified: Secondary | ICD-10-CM | POA: Diagnosis not present

## 2022-06-01 LAB — CUP PACEART REMOTE DEVICE CHECK
Battery Remaining Longevity: 78 mo
Battery Remaining Percentage: 86 %
Brady Statistic RA Percent Paced: 54 %
Brady Statistic RV Percent Paced: 1 %
Date Time Interrogation Session: 20230720034000
HighPow Impedance: 79 Ohm
Implantable Lead Implant Date: 20151228
Implantable Lead Implant Date: 20151228
Implantable Lead Location: 753859
Implantable Lead Location: 753860
Implantable Lead Model: 292
Implantable Lead Model: 5076
Implantable Lead Serial Number: 358765
Implantable Pulse Generator Implant Date: 20151228
Lead Channel Impedance Value: 500 Ohm
Lead Channel Impedance Value: 501 Ohm
Lead Channel Pacing Threshold Amplitude: 0.9 V
Lead Channel Pacing Threshold Amplitude: 1 V
Lead Channel Pacing Threshold Pulse Width: 0.4 ms
Lead Channel Pacing Threshold Pulse Width: 0.4 ms
Lead Channel Setting Pacing Amplitude: 2 V
Lead Channel Setting Pacing Amplitude: 2.4 V
Lead Channel Setting Pacing Pulse Width: 0.4 ms
Lead Channel Setting Sensing Sensitivity: 0.5 mV
Pulse Gen Serial Number: 508420

## 2022-06-03 ENCOUNTER — Other Ambulatory Visit: Payer: Self-pay | Admitting: Cardiology

## 2022-06-05 ENCOUNTER — Other Ambulatory Visit: Payer: Self-pay | Admitting: Cardiology

## 2022-06-13 ENCOUNTER — Ambulatory Visit: Payer: BC Managed Care – PPO

## 2022-06-13 DIAGNOSIS — Z952 Presence of prosthetic heart valve: Secondary | ICD-10-CM

## 2022-06-13 DIAGNOSIS — Z86711 Personal history of pulmonary embolism: Secondary | ICD-10-CM

## 2022-06-13 DIAGNOSIS — Z5181 Encounter for therapeutic drug level monitoring: Secondary | ICD-10-CM | POA: Diagnosis not present

## 2022-06-13 LAB — POCT INR: INR: 2.6 (ref 2.0–3.0)

## 2022-06-13 NOTE — Patient Instructions (Signed)
Description   Continue 2 tablets daily. Recheck INR in 7 weeks at the [place] office. Please call the office with any medication changes or additions [phone].

## 2022-06-19 DIAGNOSIS — G4733 Obstructive sleep apnea (adult) (pediatric): Secondary | ICD-10-CM | POA: Diagnosis not present

## 2022-06-23 NOTE — Progress Notes (Signed)
Remote ICD transmission.   

## 2022-06-30 ENCOUNTER — Other Ambulatory Visit: Payer: Self-pay | Admitting: Cardiology

## 2022-06-30 ENCOUNTER — Telehealth: Payer: Self-pay | Admitting: Cardiology

## 2022-06-30 DIAGNOSIS — Z952 Presence of prosthetic heart valve: Secondary | ICD-10-CM

## 2022-06-30 MED ORDER — WARFARIN SODIUM 5 MG PO TABS: ORAL_TABLET | ORAL | 0 refills | Status: DC

## 2022-06-30 NOTE — Telephone Encounter (Signed)
Prescription refill request received for warfarin Lov: Heather Mosley 09/13/2021 Next INR check: 9/19 Warfarin tablet strength: '5mg'$ 

## 2022-06-30 NOTE — Telephone Encounter (Signed)
*  STAT* If patient is at the pharmacy, call can be transferred to refill team.   1. Which medications need to be refilled? (please list name of each medication and dose if known)  warfarin (COUMADIN) 5 MG tablet  2. Which pharmacy/location (including street and city if local pharmacy) is medication to be sent to? [place], [place]  3. Do they need a 30 day or 90 day supply?  90 day supply

## 2022-08-01 ENCOUNTER — Ambulatory Visit: Payer: BC Managed Care – PPO | Attending: Cardiology

## 2022-08-01 DIAGNOSIS — Z952 Presence of prosthetic heart valve: Secondary | ICD-10-CM | POA: Diagnosis not present

## 2022-08-01 DIAGNOSIS — Z86711 Personal history of pulmonary embolism: Secondary | ICD-10-CM | POA: Diagnosis not present

## 2022-08-01 DIAGNOSIS — Z5181 Encounter for therapeutic drug level monitoring: Secondary | ICD-10-CM

## 2022-08-01 LAB — POCT INR: INR: 3.3 — AB (ref 2.0–3.0)

## 2022-08-01 NOTE — Patient Instructions (Signed)
Description   Only take 1 tablet today and then continue 2 tablets daily. Recheck INR in 5 weeks at the [place] office. Please call the office with any medication changes or additions [phone].

## 2022-08-31 ENCOUNTER — Ambulatory Visit (INDEPENDENT_AMBULATORY_CARE_PROVIDER_SITE_OTHER): Payer: BC Managed Care – PPO

## 2022-08-31 DIAGNOSIS — Z95810 Presence of automatic (implantable) cardiac defibrillator: Secondary | ICD-10-CM | POA: Diagnosis not present

## 2022-08-31 DIAGNOSIS — I469 Cardiac arrest, cause unspecified: Secondary | ICD-10-CM

## 2022-09-02 ENCOUNTER — Other Ambulatory Visit: Payer: Self-pay | Admitting: Cardiology

## 2022-09-04 ENCOUNTER — Other Ambulatory Visit: Payer: Self-pay | Admitting: Cardiology

## 2022-09-05 ENCOUNTER — Ambulatory Visit: Payer: BC Managed Care – PPO | Attending: Cardiology

## 2022-09-05 DIAGNOSIS — Z86711 Personal history of pulmonary embolism: Secondary | ICD-10-CM

## 2022-09-05 DIAGNOSIS — Z952 Presence of prosthetic heart valve: Secondary | ICD-10-CM | POA: Diagnosis not present

## 2022-09-05 DIAGNOSIS — Z5181 Encounter for therapeutic drug level monitoring: Secondary | ICD-10-CM | POA: Diagnosis not present

## 2022-09-05 LAB — POCT INR: INR: 2.7 (ref 2.0–3.0)

## 2022-09-05 NOTE — Patient Instructions (Addendum)
Description   Continue 2 tablets daily. Recheck INR in 4 weeks at the [place] office.  Please call the office with any medication changes or additions [phone].

## 2022-09-06 LAB — CUP PACEART REMOTE DEVICE CHECK
Battery Remaining Longevity: 72 mo
Battery Remaining Percentage: 84 %
Brady Statistic RA Percent Paced: 55 %
Brady Statistic RV Percent Paced: 1 %
Date Time Interrogation Session: 20231019070800
HighPow Impedance: 79 Ohm
Implantable Lead Connection Status: 753985
Implantable Lead Connection Status: 753985
Implantable Lead Implant Date: 20151228
Implantable Lead Implant Date: 20151228
Implantable Lead Location: 753859
Implantable Lead Location: 753860
Implantable Lead Model: 292
Implantable Lead Model: 5076
Implantable Lead Serial Number: 358765
Implantable Pulse Generator Implant Date: 20151228
Lead Channel Impedance Value: 485 Ohm
Lead Channel Impedance Value: 513 Ohm
Lead Channel Pacing Threshold Amplitude: 0.9 V
Lead Channel Pacing Threshold Amplitude: 1 V
Lead Channel Pacing Threshold Pulse Width: 0.4 ms
Lead Channel Pacing Threshold Pulse Width: 0.4 ms
Lead Channel Setting Pacing Amplitude: 2 V
Lead Channel Setting Pacing Amplitude: 2.4 V
Lead Channel Setting Pacing Pulse Width: 0.4 ms
Lead Channel Setting Sensing Sensitivity: 0.5 mV
Pulse Gen Serial Number: 508420
Zone Setting Status: 755011

## 2022-09-08 NOTE — Progress Notes (Signed)
Remote ICD transmission.   

## 2022-10-03 ENCOUNTER — Ambulatory Visit: Payer: BC Managed Care – PPO | Attending: Cardiology | Admitting: Cardiology

## 2022-10-03 ENCOUNTER — Ambulatory Visit (INDEPENDENT_AMBULATORY_CARE_PROVIDER_SITE_OTHER): Payer: BC Managed Care – PPO

## 2022-10-03 ENCOUNTER — Encounter: Payer: Self-pay | Admitting: Cardiology

## 2022-10-03 VITALS — BP 124/82 | HR 53 | Ht 61.0 in | Wt 345.4 lb

## 2022-10-03 DIAGNOSIS — Z5181 Encounter for therapeutic drug level monitoring: Secondary | ICD-10-CM | POA: Diagnosis not present

## 2022-10-03 DIAGNOSIS — I10 Essential (primary) hypertension: Secondary | ICD-10-CM

## 2022-10-03 DIAGNOSIS — Z954 Presence of other heart-valve replacement: Secondary | ICD-10-CM

## 2022-10-03 DIAGNOSIS — Z952 Presence of prosthetic heart valve: Secondary | ICD-10-CM

## 2022-10-03 DIAGNOSIS — Z86711 Personal history of pulmonary embolism: Secondary | ICD-10-CM

## 2022-10-03 DIAGNOSIS — Z95810 Presence of automatic (implantable) cardiac defibrillator: Secondary | ICD-10-CM

## 2022-10-03 DIAGNOSIS — Z6841 Body Mass Index (BMI) 40.0 and over, adult: Secondary | ICD-10-CM

## 2022-10-03 LAB — POCT INR: INR: 2.9 (ref 2.0–3.0)

## 2022-10-03 MED ORDER — METOPROLOL SUCCINATE ER 50 MG PO TB24: ORAL_TABLET | ORAL | 3 refills | Status: DC

## 2022-10-03 NOTE — Patient Instructions (Addendum)
Medication Instructions:  Your physician has recommended you make the following change in your medication:   STOP: Metoprolol Tartrate  Start: Metoprolol Succinate '100mg'$  in the AM,  '50mg'$  in the PM    Lab Work: BMP, Magnesium- today If you have labs (blood work) drawn today and your tests are completely normal, you will receive your results only by: [place] (if you have MyChart) OR A paper copy in the mail If you have any lab test that is abnormal or we need to change your treatment, we will call you to review the results.   Testing/Procedures: Your physician has requested that you have an echocardiogram. Echocardiography is a painless test that uses sound waves to create images of your heart. It provides your doctor with information about the size and shape of your heart and how well your heart's chambers and valves are working. This procedure takes approximately one hour. There are no restrictions for this procedure. Please do NOT wear cologne, perfume, aftershave, or lotions (deodorant is allowed). Please arrive 15 minutes prior to your appointment time.    Follow-Up: At [facility], you and your health needs are our priority.  As part of our continuing mission to provide you with exceptional heart care, we have created designated Provider Care Teams.  These Care Teams include your primary Cardiologist (physician) and Advanced Practice Providers (APPs -  Physician Assistants and Nurse Practitioners) who all work together to provide you with the care you need, when you need it.  We recommend signing up for the patient portal called "MyChart".  Sign up information is provided on this After Visit Summary.  MyChart is used to connect with patients for Virtual Visits (Telemedicine).  Patients are able to view lab/test results, encounter notes, upcoming appointments, etc.  Non-urgent messages can be sent to your provider as well.   To learn more about what you can do with MyChart,  go to [email or web address].    Your next appointment:   6 month(s)  The format for your next appointment:   In Person  Provider:   Jenne Campus, MD    Other Instructions NA

## 2022-10-03 NOTE — Progress Notes (Unsigned)
Cardiology Office Note:    Date:  10/03/2022   ID:  Heather Mosley, DOB 16-Jun-1966, MRN [id number]  PCP:  Mateo Flow, MD  Cardiologist:  Jenne Campus, MD    Referring MD: Mateo Flow, MD   Chief Complaint  Patient presents with   Follow-up    History of Present Illness:    Heather Mosley is a 56 y.o. female with very complex past medical history.  Many years ago she did have aortic root replacement with bioprosthetic arctic valve that eventually failed after that she got TAVR implanted that fell fairly quickly after that and finally she ended up with scheduled 21 mm valve done in July 24, 2017 at [facility].  Her course was complicated by pulmonary emboli, cardiac arrest, she does have an ICD.  Additional problems include dyslipidemia, morbidly obese. She is in my office today for follow-up she complain of having some palpitation she is here coronary valve working and she here her valve flip-flopping which makes her nervous otherwise no dizziness no tightness no pressure no burning in the chest no shortness of breath.  Past Medical History:  Diagnosis Date   Acute on chronic systolic ACC/AHA stage C congestive heart failure ([place]) 11/09/2014   AICD (automatic cardioverter/defibrillator) present 2015   AKI (acute kidney injury) ([place]) 07/24/2017   Aortic valvular stenoses    "born w/it"   Arthritis    "knees" (06/07/2017)   Cardiac arrest ([place]) 11/03/2014   Chest pain 06/07/2017   Congenital heart defect    Encounter for therapeutic drug monitoring 09/11/2017   Exertional dyspnea 07/16/2017   Heart murmur    High cholesterol    History of biliary T-tube placement 11/09/2014   Overview:  Overview:  BSX DC ICD implanted 11/09/14 (Followed by [facility])  Formatting of this note might be different from the original. Overview:  Overview:  BSX DC ICD implanted 11/09/14 (Followed by [facility]) Formatting of this note might be different from the original. Overview:  BSX DC ICD implanted  11/09/14 (Followed by [facility])   History of cardiac arrest 06/06/2017   History of pulmonary embolism 06/06/2017   Hypertension    Hypoxemia 07/24/2017   ICD (implantable cardioverter-defibrillator) in place 05/24/2016   Lactate blood increased 07/24/2017   Nonrheumatic aortic valve insufficiency 09/18/2014   Obesity 06/02/2015   OSA on CPAP    Pulmonary embolism ([place]) 2015   Right ventricular systolic dysfunction 9/79/8921   S/P AVR (aortic valve replacement) 07/27/2017   [employer] Jude Valve 21 mm - 07/24/2017 [facility]   S/P ICD (internal cardiac defibrillator) procedure 11/09/2014   Overview:  BSX DC ICD implanted 11/09/14 (Followed by [facility])  Formatting of this note might be different from the original. 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 She initially underwent aortic valve commissurotomy in 1985 with subsequent aortic root replacement with a porcine valve and ascending aorta and hemiarch replacement in 2006. She subsequently developed severe symptomatic prosthetic aortic valve regurgitation and underwent successful valve and valve transcatheter aortic valve replacement with a   Unspecified dyspareunia (CODE) 11/23/2020    Past Surgical History:  Procedure Laterality Date   ANOMALOUS PULMONARY VENOUS RETURN REPAIR, TOTAL  2015   AORTIC VALVE REPAIR  1985   AORTIC VALVE REPLACEMENT  2006   aortic valve/aortic root replacement with porcine valve, and extending aorta and hemi-arch replacement /notes 06/07/2017   [place]; 2006; 2015   "prior to my ORs those years"   [place]  2015   CARDIAC VALVE REPLACEMENT     CESAREAN SECTION  2008   LEFT  HEART CATH AND CORONARY ANGIOGRAPHY N/A 06/08/2017   Procedure: Left Heart Cath and Coronary Angiography;  Surgeon: Belva Crome, MD;  Location: [place] [facility];  Service: Cardiovascular;  Laterality: N/A;    Current Medications: Current Meds  Medication Sig   acetaminophen (TYLENOL) 325 MG tablet Take 650 mg by mouth 2 (two) times daily as needed for moderate pain or headache.    aspirin EC 81 MG tablet Take 81 mg by mouth daily.   atorvastatin (LIPITOR) 80 MG tablet TAKE 1 TABLET BY MOUTH ONCE DAILY. (Patient taking differently: Take 80 mg by mouth daily.)   FUROSEMIDE PO Take 1 tablet by mouth as needed (fluid retention).   Glucosamine 500 MG CAPS Take 2 capsules by mouth daily.   metoprolol tartrate (LOPRESSOR) 50 MG tablet Take 1 tablet (50 mg total) by mouth 2 (two) times daily.   mexiletine (MEXITIL) 250 MG capsule Take 1 capsule (250 mg total) by mouth 2 (two) times daily. Patient must keep 10/03/22 for further refills. 3 rd/final attempt   nitroGLYCERIN (NITROSTAT) 0.4 MG SL tablet PLACE 1 TABLET UNDER THE TONGUE EVERY 5 MINUTES FOR 3 DOSES AS NEEDED FOR CHEST PAIN (Patient taking differently: Place 0.4 mg under the tongue every 5 (five) minutes as needed for chest pain.)   potassium chloride (KLOR-CON) 10 MEQ tablet Take 1 tablet (10 mEq total) by mouth as needed. (Patient taking differently: Take 10 mEq by mouth as needed (take with Furosemide).)   warfarin (COUMADIN) 5 MG tablet TAKE 2 TABLETS BY MOUTH DAILY OR AS DIRECTED BY COUMADIN CLINIC (Patient taking differently: Take 10 mg by mouth daily at 4 PM. TAKE 2 TABLETS BY MOUTH DAILY OR AS DIRECTED BY COUMADIN CLINIC)   warfarin (COUMADIN) 5 MG tablet TAKE 2 TABLETS BY MOUTH DAILY OR AS DIRECTED BY COUMADIN CLINIC (Patient taking differently: Take 5 mg by mouth daily at 4 PM. TAKE 2 TABLETS BY MOUTH DAILY OR AS DIRECTED BY COUMADIN CLINIC)     Allergies:   Tape   Social History   Socioeconomic History   Marital status:  Married    Spouse name: Not on file   Number of children: Not on file   Years of education: Not on file   Highest education level: Not on file  Occupational History   Not on file  Tobacco Use   Smoking status: Never   Smokeless tobacco: Never  Vaping Use   Vaping Use: Never used  Substance and Sexual Activity   Alcohol use: No   Drug use: No   Sexual activity: Yes  Other Topics Concern   Not on file  Social History Narrative   Not on file   Social Determinants of Health   Financial Resource Strain: Not on file  Food Insecurity: Not on file  Transportation Needs: Not on file  Physical Activity: Not on file  Stress: Not on file  Social Connections: Not on file     Family History: The patient's family history includes Hyperlipidemia in her father; Hypertension in her mother; Leukemia in her father. ROS:   Please see the history of present  illness.    All 14 point review of systems negative except as described per history of present illness  EKGs/Labs/Other Studies Reviewed:      Recent Labs: No results found for requested labs within last 365 days.  Recent Lipid Panel    Component Value Date/Time   CHOL 165 09/13/2021 1004   TRIG 219 (H) 09/13/2021 1004   HDL 49 09/13/2021 1004   CHOLHDL 3.4 09/13/2021 1004   LDLCALC 80 09/13/2021 1004    Physical Exam:    VS:  BP 124/82 (BP Location: Left Arm, Patient Position: Sitting)   Pulse (!) 53   Ht '5\' 1"'$  (1.549 m)   Wt (!) 345 lb 6.4 oz (156.7 kg)   SpO2 97%   BMI 65.26 kg/m     Wt Readings from Last 3 Encounters:  10/03/22 (!) 345 lb 6.4 oz (156.7 kg)  09/13/21 (!) 343 lb 6.4 oz (155.8 kg)  06/06/21 (!) 345 lb (156.5 kg)     GEN:  Well nourished, well developed in no acute distress HEENT: Normal NECK: No JVD; No carotid bruits LYMPHATICS: No lymphadenopathy CARDIAC: RRR, crisp valve sounds are present, no murmurs, no rubs, no gallops RESPIRATORY:  Clear to auscultation without rales, wheezing or rhonchi   ABDOMEN: Soft, non-tender, non-distended MUSCULOSKELETAL:  No edema; No deformity  SKIN: Warm and dry LOWER EXTREMITIES: no swelling NEUROLOGIC:  Alert and oriented x 3 PSYCHIATRIC:  Normal affect   ASSESSMENT:    1. 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 S/P TAVR (transcatheter aortic valve replacement)   3. S/P ICD (internal cardiac defibrillator) procedure   4. Primary hypertension   5. Class 3 severe obesity due to excess calories with body mass index (BMI) of 40.0 to 44.9 in adult, unspecified whether serious comorbidity present ([place])   6. S/P AVR (aortic valve replacement)    PLAN:    In order of problems listed above:  Status post aortic valve replacement surgery with prosthesis, schedule her to have echocardiogram to assess left ventricle ejection fraction as well as function of the valve. ICD present, it is mostly anterior device, I did review interrogation the device she does have some APCs noted, no recent therapy delivered. Essential hyper tension blood pressure well controlled continue present management. Morbid obesity still a problem.  She understands she is trying to work on losing weight but so far not successful. Palpitations, she describes APCs.  On the EKG today we did notice 2 APCs, interrogation of the device also showed the same arrhythmia.  I will try to check her electrolytes including potassium and magnesium, I will switch her metoprolol from metoprolol titrate 50 twice daily to metoprolol succinate 100 in the morning and 50 in the afternoon.  Hopefully that helps I will also schedule To have echocardiogram to assess left ventricle ejection fraction.   Medication Adjustments/Labs and Tests Ordered: Current medicines are reviewed at length with the patient today.  Concerns regarding medicines are outlined above.  No orders of the defined types were placed in this encounter.  Medication changes:  No orders of the defined types were placed in this encounter.   Signed, Park Liter, MD, [facility] 10/03/2022 9:49 AM    [place]

## 2022-10-03 NOTE — Patient Instructions (Signed)
Description   Continue 2 tablets daily. Recheck INR in 5 weeks at the [place] office.  Please call the office with any medication changes or additions [phone].

## 2022-10-04 ENCOUNTER — Telehealth: Payer: Self-pay

## 2022-10-04 LAB — BASIC METABOLIC PANEL
BUN/Creatinine Ratio: 17 (ref 9–23)
BUN: 13 mg/dL (ref 6–24)
CO2: 23 mmol/L (ref 20–29)
Calcium: 9.1 mg/dL (ref 8.7–10.2)
Chloride: 105 mmol/L (ref 96–106)
Creatinine, Ser: 0.78 mg/dL (ref 0.57–1.00)
Glucose: 128 mg/dL — ABNORMAL HIGH (ref 70–99)
Potassium: 4.4 mmol/L (ref 3.5–5.2)
Sodium: 141 mmol/L (ref 134–144)
eGFR: 90 mL/min/{1.73_m2} (ref >59)

## 2022-10-04 LAB — MAGNESIUM: Magnesium: 1.5 mg/dL — ABNORMAL LOW (ref 1.6–2.3)

## 2022-10-04 NOTE — Telephone Encounter (Signed)
Results reviewed with pt as per Dr. Krasowski's note.  Pt verbalized understanding and had no additional questions. Routed to PCP  

## 2022-10-16 ENCOUNTER — Ambulatory Visit: Payer: BC Managed Care – PPO | Attending: Cardiology | Admitting: Cardiology

## 2022-10-16 ENCOUNTER — Encounter: Payer: Self-pay | Admitting: Cardiology

## 2022-10-16 VITALS — BP 128/74 | HR 73 | Ht 61.0 in | Wt 339.0 lb

## 2022-10-16 DIAGNOSIS — I469 Cardiac arrest, cause unspecified: Secondary | ICD-10-CM

## 2022-10-16 DIAGNOSIS — I472 Ventricular tachycardia, unspecified: Secondary | ICD-10-CM

## 2022-10-16 DIAGNOSIS — I491 Atrial premature depolarization: Secondary | ICD-10-CM

## 2022-10-16 DIAGNOSIS — Z79899 Other long term (current) drug therapy: Secondary | ICD-10-CM | POA: Diagnosis not present

## 2022-10-16 LAB — CUP PACEART INCLINIC DEVICE CHECK
Date Time Interrogation Session: 20231204094214
HighPow Impedance: 88 Ohm
Implantable Lead Connection Status: 753985
Implantable Lead Connection Status: 753985
Implantable Lead Implant Date: 20151228
Implantable Lead Implant Date: 20151228
Implantable Lead Location: 753859
Implantable Lead Location: 753860
Implantable Lead Model: 292
Implantable Lead Model: 5076
Implantable Lead Serial Number: 358765
Implantable Pulse Generator Implant Date: 20151228
Lead Channel Impedance Value: 495 Ohm
Lead Channel Impedance Value: 499 Ohm
Lead Channel Pacing Threshold Amplitude: 0.6 V
Lead Channel Pacing Threshold Amplitude: 0.7 V
Lead Channel Pacing Threshold Pulse Width: 0.4 ms
Lead Channel Pacing Threshold Pulse Width: 0.4 ms
Lead Channel Setting Pacing Amplitude: 2 V
Lead Channel Setting Pacing Amplitude: 2.4 V
Lead Channel Setting Pacing Pulse Width: 0.4 ms
Lead Channel Setting Sensing Sensitivity: 0.5 mV
Pulse Gen Serial Number: 508420
Zone Setting Status: 755011

## 2022-10-16 MED ORDER — MEXILETINE HCL 150 MG PO CAPS: 300.0000 mg | ORAL_CAPSULE | Freq: Two times a day (BID) | ORAL | 3 refills | Status: DC

## 2022-10-16 MED ORDER — METOPROLOL SUCCINATE ER 100 MG PO TB24: 100.0000 mg | ORAL_TABLET | Freq: Two times a day (BID) | ORAL | 1 refills | Status: DC

## 2022-10-16 NOTE — Patient Instructions (Addendum)
Medication Instructions:  Your physician has recommended you make the following change in your medication:  INCREASE Mexiletine to 300 mg twice daily INCREASE Metoprolol Succinate (Toprol) to 100 mg twice daily  *If you need a refill on your cardiac medications before your next appointment, please call your pharmacy*   Lab Work: None ordered   Testing/Procedures: None ordered   Follow-Up: At [facility], you and your health needs are our priority.  As part of our continuing mission to provide you with exceptional heart care, we have created designated Provider Care Teams.  These Care Teams include your primary Cardiologist (physician) and Advanced Practice Providers (APPs -  Physician Assistants and Nurse Practitioners) who all work together to provide you with the care you need, when you need it.  Remote monitoring is used to monitor your Pacemaker or ICD from home. This monitoring reduces the number of office visits required to check your device to one time per year. It allows [place] to keep an eye on the functioning of your device to ensure it is working properly. You are scheduled for a device check from home on 11/30/2022. You may send your transmission at any time that day. If you have a wireless device, the transmission will be sent automatically. After your physician reviews your transmission, you will receive a postcard with your next transmission date.  Your next appointment:   3 month(s)  The format for your next appointment:   In Person  Provider:   Allegra Lai, MD    Thank you for choosing [place]!!   Trinidad Curet, RN [phone]    Other Instructions   Important Information About Sugar

## 2022-10-16 NOTE — Progress Notes (Signed)
Electrophysiology Office Note   Date:  10/16/2022   ID:  Heather Mosley, DOB 03-26-1966, MRN [id number]  PCP:  Mateo Flow, MD  Cardiologist:  Agustin Cree Primary Electrophysiologist:  Kayleann Mccaffery Meredith Leeds, MD    No chief complaint on file.     History of Present Illness: Heather Mosley is a 56 y.o. female who is being seen today for the evaluation of ventricular tachycardia, ICD at the request of [facility]. Presenting today for electrophysiology evaluation.    She has a history significant for aortic valve/root replacement with a porcine valve and extended aortic and hemiarch replacement in 2006, obesity, hypertension.  She developed symptomatic prosthetic regurgitation and underwent successful TAVR in 2015.  Shortly thereafter she developed PEA arrest and cardiac arrest secondary to VT.  She is post [place] implanted at [facility] and was started on Xarelto.  She had recurrent ventricular tachycardia and has been started on mexiletine.  Today, denies symptoms of chest pain, shortness of breath, orthopnea, PND, lower extremity edema, claudication, dizziness, presyncope, syncope, bleeding, or neurologic sequela. The patient is tolerating medications without difficulties.  She is continue to have episodes of PACs and nonsustained VT.  She does have palpitations.  She states that she feels them mainly when she lays down to sleep.  She is mostly able to do all of her daily activities, but she is somewhat limited by palpitations.   Past Medical History:  Diagnosis Date   Acute on chronic systolic ACC/AHA stage C congestive heart failure ([place]) 11/09/2014   AICD (automatic cardioverter/defibrillator) present 2015   AKI (acute kidney injury) ([place]) 07/24/2017   Aortic valvular stenoses    "born w/it"   Arthritis    "knees" (06/07/2017)   Cardiac arrest ([place]) 11/03/2014   Chest pain 06/07/2017   Congenital heart defect    Encounter for therapeutic drug monitoring  09/11/2017   Exertional dyspnea 07/16/2017   Heart murmur    High cholesterol    History of biliary T-tube placement 11/09/2014   Overview:  Overview:  BSX DC ICD implanted 11/09/14 (Followed by [facility])  Formatting of this note might be different from the original. Overview:  Overview:  BSX DC ICD implanted 11/09/14 (Followed by [facility]) Formatting of this note might be different from the original. Overview:  BSX DC ICD implanted 11/09/14 (Followed by [facility])   History of cardiac arrest 06/06/2017   History of pulmonary embolism 06/06/2017   Hypertension    Hypoxemia 07/24/2017   ICD (implantable cardioverter-defibrillator) in place 05/24/2016   Lactate blood increased 07/24/2017   Nonrheumatic aortic valve insufficiency 09/18/2014   Obesity 06/02/2015   OSA on CPAP    Pulmonary embolism ([place]) 2015   Right ventricular systolic dysfunction 02/19/8118   S/P AVR (aortic valve replacement) 07/27/2017   St Jude Valve 21 mm - 07/24/2017 [facility]   S/P ICD (internal cardiac defibrillator) procedure 11/09/2014   Overview:  BSX DC ICD implanted 11/09/14 (Followed by [facility])  Formatting of this note might be different from the original. 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 She initially underwent aortic valve commissurotomy in 1985 with subsequent aortic root replacement with a porcine valve and ascending aorta and hemiarch replacement in 2006. She subsequently developed severe symptomatic  prosthetic aortic valve regurgitation and underwent successful valve and valve transcatheter aortic valve replacement with a   Unspecified dyspareunia (CODE) 11/23/2020   Past Surgical History:  Procedure Laterality Date   ANOMALOUS  PULMONARY VENOUS RETURN REPAIR, TOTAL  2015   AORTIC VALVE REPAIR  1985   AORTIC VALVE REPLACEMENT  2006   aortic valve/aortic root replacement with porcine valve, and extending aorta and hemi-arch replacement /notes 06/07/2017   [place]; 2006; 2015   "prior to my ORs those years"   CARDIAC DEFIBRILLATOR PLACEMENT  2015   CARDIAC VALVE REPLACEMENT     CESAREAN SECTION  2008   LEFT HEART CATH AND CORONARY ANGIOGRAPHY N/A 06/08/2017   Procedure: Left Heart Cath and Coronary Angiography;  Surgeon: Belva Crome, MD;  Location: [facility];  Service: Cardiovascular;  Laterality: N/A;     Current Outpatient Medications  Medication Sig Dispense Refill   acetaminophen (TYLENOL) 325 MG tablet Take 650 mg by mouth 2 (two) times daily as needed for moderate pain or headache.      aspirin EC 81 MG tablet Take 81 mg by mouth daily.     atorvastatin (LIPITOR) 80 MG tablet TAKE 1 TABLET BY MOUTH ONCE DAILY. (Patient taking differently: Take 80 mg by mouth daily.) 90 tablet 1   FUROSEMIDE PO Take 1 tablet by mouth as needed (fluid retention).     Glucosamine 500 MG CAPS Take 2 capsules by mouth daily.     metoprolol succinate (TOPROL-XL) 50 MG 24 hr tablet Take 2 tablets ('100mg'$ ) in in AM and 1 tablet in the PM ('50mg'$ ). Take with or immediately following a meal. 270 tablet 3   nitroGLYCERIN (NITROSTAT) 0.4 MG SL tablet PLACE 1 TABLET UNDER THE TONGUE EVERY 5 MINUTES FOR 3 DOSES AS NEEDED FOR CHEST PAIN (Patient taking differently: Place 0.4 mg under the tongue every 5 (five) minutes as needed for chest pain.) 25 tablet 11   potassium chloride (KLOR-CON) 10 MEQ tablet Take 1 tablet (10 mEq total) by mouth as needed. (Patient taking differently: Take 10 mEq by mouth as needed (take with Furosemide).) 90 tablet 1   warfarin (COUMADIN) 5 MG tablet TAKE 2 TABLETS BY MOUTH DAILY OR AS DIRECTED BY COUMADIN CLINIC (Patient taking differently: Take 10 mg by  mouth daily at 4 PM. TAKE 2 TABLETS BY MOUTH DAILY OR AS DIRECTED BY COUMADIN CLINIC) 190 tablet 0   warfarin (COUMADIN) 5 MG tablet TAKE 2 TABLETS BY MOUTH DAILY OR AS DIRECTED BY COUMADIN CLINIC (Patient taking differently: Take 5 mg by mouth daily at 4 PM. TAKE 2 TABLETS BY MOUTH DAILY OR AS DIRECTED BY COUMADIN CLINIC) 190 tablet 0   No current facility-administered medications for this visit.    Allergies:   Tape   Social History:  The patient  reports that she has never smoked. She has never used smokeless tobacco. She reports that she does not drink alcohol and does not use drugs.   Family History:  The patient's family history includes Hyperlipidemia in her father; Hypertension in her mother; Leukemia in her father.   ROS:  Please see the history of present illness.   Otherwise, review of systems is positive for none.   All other systems are reviewed and negative.   PHYSICAL EXAM: VS:  BP 128/74   Pulse 73   Ht '5\' 1"'$  (1.549 m)   Wt (!) 339 lb (153.8 kg)   SpO2 98%  BMI 64.05 kg/m  , BMI Body mass index is 64.05 kg/m. GEN: Well nourished, well developed, in no acute distress  HEENT: normal  Neck: no JVD, carotid bruits, or masses Cardiac: RRR; no murmurs, rubs, or gallops,no edema  Respiratory:  clear to auscultation bilaterally, normal work of breathing GI: soft, nontender, nondistended, + BS MS: no deformity or atrophy  Skin: warm and dry, device site well healed Neuro:  Strength and sensation are intact Psych: euthymic mood, full affect  EKG:  EKG is not ordered today. Personal review of the ekg ordered 2068/03/15 shows atrial paced, lateral T wave inversions, rate 78  Personal review of the device interrogation today. Results in [place]: 10/03/2022: BUN 13; Creatinine, Ser 0.78; Magnesium 1.5; Potassium 4.4; Sodium 141    Lipid Panel     Component Value Date/Time   CHOL 165 09/13/2021 1004   TRIG 219 (H) 09/13/2021 1004   HDL 49 09/13/2021  1004   CHOLHDL 3.4 09/13/2021 1004   LDLCALC 80 09/13/2021 1004     Wt Readings from Last 3 Encounters:  10/16/22 (!) 339 lb (153.8 kg)  10/03/22 (!) 345 lb 6.4 oz (156.7 kg)  09/13/21 (!) 343 lb 6.4 oz (155.8 kg)      Other studies Reviewed: Additional studies/ records that were reviewed today include: TTE 12/28/21  Review of the above records today demonstrates:   1. Left ventricular ejection fraction, by estimation, is 60 to 65%. The  left ventricle has normal function. The left ventricle has no regional  wall motion abnormalities. There is moderate left ventricular hypertrophy.  Left ventricular diastolic  parameters are consistent with Grade III diastolic dysfunction  (restrictive).   2. Right ventricular systolic function is normal. The right ventricular  size is normal. There is normal pulmonary artery systolic pressure.   3. Left atrial size was moderately dilated.   4. The mitral valve is normal in structure. No evidence of mitral valve  regurgitation. No evidence of mitral stenosis.   5. Please note gradients across the prosthetic valve.The aortic valve was  not well visualized. Aortic valve regurgitation is not visualized.There is  a 21 mm St. Jude mechanical valve present in the aortic position. Aortic  valve area, by VTI measures  1.05 cm. Aortic valve mean gradient measures 23.5 mmHg. Aortic valve Vmax  measures 3.25 m/s.   6. The inferior vena cava is normal in size with greater than 50%  respiratory variability, suggesting right atrial pressure of 3 mmHg.    ASSESSMENT AND PLAN:  1.  Ventricular tachycardia: Status post cardiac arrest.  Is post [employer] ICD implanted in 2015.  Device functioning appropriately.  He is currently on mexiletine to 250 mg twice daily for recurrent ventricular tachycardia.  She is continue to have short episodes of ventricular tachycardia.  Due to that, we Jeneane Pieczynski increase mexiletine to 300 mg twice daily.  2.  Aortic  valve/root replacement: Followed by Agustin Cree.  No changes.  3.  Hypertension: Currently well controlled  4.  Obesity: Lifestyle modification encouraged Body mass index is 64.05 kg/m.  5.  Obstructive sleep apnea: CPAP compliance encouraged  6.  PACs: She is having multiple PACs, making her symptomatic.  Her metoprolol was recently increased.  We Linea Calles increase it again to 100 mg twice daily.  If she continues to have episodes of PACs and short runs of ventricular tachycardia, sotalol would potentially be a reasonable option.  7.  High risk medication monitoring: Patient currently  on mexiletine.  Most recent ECG without major abnormality.  No changes.   Current medicines are reviewed at length with the patient today.   The patient does not have concerns regarding her medicines.  The following changes were made today: Increase Toprol-XL, mexiletine  Labs/ tests ordered today include:  No orders of the defined types were placed in this encounter.     Disposition:   FU with Murial Beam 3 months  Signed, Safal Halderman Meredith Leeds, MD  10/16/2022 9:43 AM     [facility] [address] [place] [address] [phone] (office) [phone] (fax)

## 2022-10-25 ENCOUNTER — Other Ambulatory Visit: Payer: Self-pay | Admitting: Cardiology

## 2022-11-14 ENCOUNTER — Ambulatory Visit: Payer: BC Managed Care – PPO | Attending: Cardiology

## 2022-11-14 DIAGNOSIS — Z86711 Personal history of pulmonary embolism: Secondary | ICD-10-CM | POA: Diagnosis not present

## 2022-11-14 DIAGNOSIS — Z952 Presence of prosthetic heart valve: Secondary | ICD-10-CM | POA: Diagnosis not present

## 2022-11-14 DIAGNOSIS — Z5181 Encounter for therapeutic drug level monitoring: Secondary | ICD-10-CM | POA: Diagnosis not present

## 2022-11-14 LAB — POCT INR: INR: 2.1 (ref 2.0–3.0)

## 2022-11-14 NOTE — Patient Instructions (Signed)
Description   Continue 2 tablets daily. Recheck INR in 6 weeks at the  office.  Please call the office with any medication changes or additions [phone].       

## 2022-11-30 ENCOUNTER — Ambulatory Visit: Payer: BC Managed Care – PPO | Attending: Cardiology

## 2022-11-30 DIAGNOSIS — I469 Cardiac arrest, cause unspecified: Secondary | ICD-10-CM | POA: Diagnosis not present

## 2022-12-04 LAB — CUP PACEART REMOTE DEVICE CHECK
Battery Remaining Longevity: 72 mo
Battery Remaining Percentage: 80 %
Brady Statistic RA Percent Paced: 68 %
Brady Statistic RV Percent Paced: 1 %
Date Time Interrogation Session: 20240119221000
HighPow Impedance: 83 Ohm
Implantable Lead Connection Status: 753985
Implantable Lead Connection Status: 753985
Implantable Lead Implant Date: 20151228
Implantable Lead Implant Date: 20151228
Implantable Lead Location: 753859
Implantable Lead Location: 753860
Implantable Lead Model: 292
Implantable Lead Model: 5076
Implantable Lead Serial Number: 358765
Implantable Pulse Generator Implant Date: 20151228
Lead Channel Impedance Value: 445 Ohm
Lead Channel Impedance Value: 523 Ohm
Lead Channel Pacing Threshold Amplitude: 0.6 V
Lead Channel Pacing Threshold Amplitude: 0.7 V
Lead Channel Pacing Threshold Pulse Width: 0.4 ms
Lead Channel Pacing Threshold Pulse Width: 0.4 ms
Lead Channel Setting Pacing Amplitude: 2 V
Lead Channel Setting Pacing Amplitude: 2.4 V
Lead Channel Setting Pacing Pulse Width: 0.4 ms
Lead Channel Setting Sensing Sensitivity: 0.5 mV
Pulse Gen Serial Number: 508420
Zone Setting Status: 755011

## 2022-12-18 NOTE — Progress Notes (Signed)
Remote ICD transmission.   

## 2022-12-19 ENCOUNTER — Ambulatory Visit: Payer: BC Managed Care – PPO | Attending: Cardiology

## 2022-12-19 DIAGNOSIS — Z95810 Presence of automatic (implantable) cardiac defibrillator: Secondary | ICD-10-CM | POA: Diagnosis not present

## 2022-12-19 DIAGNOSIS — Z954 Presence of other heart-valve replacement: Secondary | ICD-10-CM | POA: Diagnosis not present

## 2022-12-19 DIAGNOSIS — I10 Essential (primary) hypertension: Secondary | ICD-10-CM | POA: Diagnosis not present

## 2022-12-19 DIAGNOSIS — Z952 Presence of prosthetic heart valve: Secondary | ICD-10-CM | POA: Diagnosis not present

## 2022-12-19 LAB — ECHOCARDIOGRAM COMPLETE
AR max vel: 0.99 cm2
AV Area VTI: 1.03 cm2
AV Area mean vel: 0.99 cm2
AV Mean grad: 20.5 mmHg
AV Peak grad: 36.1 mmHg
Ao pk vel: 3.01 m/s
Area-P 1/2: 3.76 cm2
S' Lateral: 3.1 cm

## 2022-12-19 MED ORDER — PERFLUTREN LIPID MICROSPHERE
1.0000 mL | INTRAVENOUS | Status: AC | PRN
  Administered 2022-12-19: 5 mL via INTRAVENOUS

## 2022-12-21 ENCOUNTER — Telehealth: Payer: Self-pay

## 2022-12-21 NOTE — Telephone Encounter (Signed)
LVM and My Chart Message per DPR- per Dr. Krasowski's note regarding normal lab results. Encouraged to call with any questions or concerns  

## 2022-12-26 ENCOUNTER — Ambulatory Visit: Payer: BC Managed Care – PPO | Attending: Cardiology

## 2022-12-26 DIAGNOSIS — Z952 Presence of prosthetic heart valve: Secondary | ICD-10-CM | POA: Diagnosis not present

## 2022-12-26 DIAGNOSIS — Z5181 Encounter for therapeutic drug level monitoring: Secondary | ICD-10-CM

## 2022-12-26 LAB — POCT INR: INR: 2.8 (ref 2.0–3.0)

## 2022-12-26 NOTE — Patient Instructions (Signed)
Description   Continue 2 tablets daily. Recheck INR in 6 weeks at the [place] office.  Please call the office with any medication changes or additions [phone].

## 2023-01-03 ENCOUNTER — Other Ambulatory Visit: Payer: Self-pay | Admitting: Cardiology

## 2023-01-03 DIAGNOSIS — Z952 Presence of prosthetic heart valve: Secondary | ICD-10-CM

## 2023-01-03 NOTE — Telephone Encounter (Signed)
Refill request for warfarin:  Last INR was 2.8 on 12/26/22 Next INR due 02/06/23 LOV was 10/16/22  Elliot Cousin MD  Refill approved.

## 2023-01-15 ENCOUNTER — Ambulatory Visit: Payer: BC Managed Care – PPO | Attending: Cardiology | Admitting: Cardiology

## 2023-01-15 ENCOUNTER — Encounter: Payer: Self-pay | Admitting: Cardiology

## 2023-01-15 VITALS — BP 130/84 | HR 81 | Ht 61.0 in | Wt 344.0 lb

## 2023-01-15 DIAGNOSIS — G4733 Obstructive sleep apnea (adult) (pediatric): Secondary | ICD-10-CM

## 2023-01-15 DIAGNOSIS — I472 Ventricular tachycardia, unspecified: Secondary | ICD-10-CM

## 2023-01-15 DIAGNOSIS — I491 Atrial premature depolarization: Secondary | ICD-10-CM

## 2023-01-15 LAB — CUP PACEART INCLINIC DEVICE CHECK
Date Time Interrogation Session: 20240304171627
HighPow Impedance: 88 Ohm
Implantable Lead Connection Status: 753985
Implantable Lead Connection Status: 753985
Implantable Lead Implant Date: 20151228
Implantable Lead Implant Date: 20151228
Implantable Lead Location: 753859
Implantable Lead Location: 753860
Implantable Lead Model: 292
Implantable Lead Model: 5076
Implantable Lead Serial Number: 358765
Implantable Pulse Generator Implant Date: 20151228
Lead Channel Impedance Value: 459 Ohm
Lead Channel Impedance Value: 485 Ohm
Lead Channel Pacing Threshold Amplitude: 0.5 V
Lead Channel Pacing Threshold Amplitude: 0.9 V
Lead Channel Pacing Threshold Pulse Width: 0.4 ms
Lead Channel Pacing Threshold Pulse Width: 0.4 ms
Lead Channel Sensing Intrinsic Amplitude: 2.6 mV
Lead Channel Sensing Intrinsic Amplitude: 9.4 mV
Lead Channel Setting Pacing Amplitude: 2 V
Lead Channel Setting Pacing Amplitude: 2.4 V
Lead Channel Setting Pacing Pulse Width: 0.4 ms
Lead Channel Setting Sensing Sensitivity: 0.5 mV
Pulse Gen Serial Number: 508420
Zone Setting Status: 755011

## 2023-01-15 NOTE — Progress Notes (Signed)
Electrophysiology Office Note   Date:  01/15/2023   ID:  Heather Mosley, DOB January 30, 1966, MRN [id number]  PCP:  Mateo Flow, MD  Cardiologist:  Agustin Cree Primary Electrophysiologist:  Daxtin Leiker Meredith Leeds, MD    No chief complaint on file.     History of Present Illness: Heather Mosley is a 57 y.o. female who is being seen today for the evaluation of ventricular tachycardia, ICD at the request of Jenne [facility]. Presenting today for electrophysiology evaluation.    She has a history seen for aortic valve/root replacement with porcine valve and extended aortic arch and hemiarch replacement in 2006, obesity, hypertension.  She developed symptomatic prosthetic regurgitation and underwent successful TAVR in 2015.  Shortly thereafter she developed PEA arrest secondary to VT.  She is post [place] implanted at [facility] and she was started on Xarelto.  She had recurrent ventricular tachycardia and is now on mexiletine.  Today, denies symptoms of palpitations, chest pain, shortness of breath, orthopnea, PND, lower extremity edema, claudication, dizziness, presyncope, syncope, bleeding, or neurologic sequela. The patient is tolerating medications without difficulties.  Since being seen she has done well.  She has had no chest pain or shortness of breath.  She is able do all of her daily activities.  She does feel better on the higher doses of metoprolol and mexiletine.  She feels that her heart rates are better controlled.   Past Medical History:  Diagnosis Date   Acute on chronic systolic ACC/AHA stage C congestive heart failure ([place]) 11/09/2014   AICD (automatic cardioverter/defibrillator) present 2015   AKI (acute kidney injury) ([place]) 07/24/2017   Aortic valvular stenoses    "born w/it"   Arthritis    "knees" (06/07/2017)   Cardiac arrest ([place]) 11/03/2014   Chest pain 06/07/2017   Congenital heart defect    Encounter for therapeutic drug monitoring 09/11/2017    Exertional dyspnea 07/16/2017   Heart murmur    High cholesterol    History of biliary T-tube placement 11/09/2014   Overview:  Overview:  BSX DC ICD implanted 11/09/14 (Followed by [facility])  Formatting of this note might be different from the original. Overview:  Overview:  BSX DC ICD implanted 11/09/14 (Followed by [facility]) Formatting of this note might be different from the original. Overview:  BSX DC ICD implanted 11/09/14 (Followed by [facility])   History of cardiac arrest 06/06/2017   History of pulmonary embolism 06/06/2017   Hypertension    Hypoxemia 07/24/2017   ICD (implantable cardioverter-defibrillator) in place 05/24/2016   Lactate blood increased 07/24/2017   Nonrheumatic aortic valve insufficiency 09/18/2014   Obesity 06/02/2015   OSA on CPAP    Pulmonary embolism ([place]) 2015   Right ventricular systolic dysfunction [id number]   S/P AVR (aortic valve replacement) 07/27/2017   St Jude Valve 21 mm - 07/24/2017 [facility]   S/P ICD (internal cardiac defibrillator) procedure 11/09/2014   Overview:  BSX DC ICD implanted 11/09/14 (Followed by [facility])  Formatting of this note might be different from the original. 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 She initially underwent aortic valve commissurotomy in 1985 with subsequent aortic root replacement with a porcine valve and ascending aorta and hemiarch replacement in 2006. She subsequently developed severe symptomatic  prosthetic aortic valve regurgitation and underwent successful valve and valve transcatheter aortic valve replacement with a   Unspecified dyspareunia (CODE) 11/23/2020   Past Surgical History:  Procedure Laterality Date   ANOMALOUS PULMONARY VENOUS  RETURN REPAIR, TOTAL  2015   AORTIC VALVE REPAIR  1985   AORTIC VALVE REPLACEMENT  2006   aortic valve/aortic root replacement with porcine valve, and extending aorta and hemi-arch replacement /notes 06/07/2017   [place]; 2006; 2015   "prior to my ORs those years"   CARDIAC DEFIBRILLATOR PLACEMENT  2015   CARDIAC VALVE REPLACEMENT     CESAREAN SECTION  2008   LEFT HEART CATH AND CORONARY ANGIOGRAPHY N/A 06/08/2017   Procedure: Left Heart Cath and Coronary Angiography;  Surgeon: Belva Crome, MD;  Location: [facility];  Service: Cardiovascular;  Laterality: N/A;     Current Outpatient Medications  Medication Sig Dispense Refill   acetaminophen (TYLENOL) 325 MG tablet Take 650 mg by mouth 2 (two) times daily as needed for moderate pain or headache.      aspirin EC 81 MG tablet Take 81 mg by mouth daily.     atorvastatin (LIPITOR) 80 MG tablet Take 1 tablet (80 mg total) by mouth daily. 90 tablet 2   FUROSEMIDE PO Take 1 tablet by mouth as needed (fluid retention).     Glucosamine 500 MG CAPS Take 2 capsules by mouth daily.     metoprolol succinate (TOPROL-XL) 100 MG 24 hr tablet Take 1 tablet (100 mg total) by mouth in the morning and at bedtime. Take with or immediately following a meal. 180 tablet 1   mexiletine (MEXITIL) 150 MG capsule Take 2 capsules (300 mg total) by mouth 2 (two) times daily. 120 capsule 3   nitroGLYCERIN (NITROSTAT) 0.4 MG SL tablet PLACE 1 TABLET UNDER THE TONGUE EVERY 5 MINUTES FOR 3 DOSES AS NEEDED FOR CHEST PAIN (Patient taking differently: Place 0.4 mg under the tongue every 5 (five) minutes as needed for chest pain.) 25 tablet 11   potassium chloride (KLOR-CON) 10 MEQ tablet Take 1 tablet (10 mEq total) by mouth as needed. (Patient taking differently: Take 10 mEq by mouth as needed (take with Furosemide).) 90 tablet 1   warfarin (COUMADIN) 5 MG tablet TAKE 2 TABLETS BY MOUTH DAILY OR AS DIRECTED BY  COUMADIN CLINIC (Patient taking differently: Take 5 mg by mouth daily at 4 PM. TAKE 2 TABLETS BY MOUTH DAILY OR AS DIRECTED BY COUMADIN CLINIC) 190 tablet 0   warfarin (COUMADIN) 5 MG tablet TAKE 2 TABLETS BY MOUTH DAILY OR AS DIRECTED BY COUMADIN CLINIC 190 tablet 1   No current facility-administered medications for this visit.    Allergies:   Tape   Social History:  The patient  reports that she has never smoked. She has never used smokeless tobacco. She reports that she does not drink alcohol and does not use drugs.   Family History:  The patient's family history includes Hyperlipidemia in her father; Hypertension in her mother; Leukemia in her father.   ROS:  Please see the history of present illness.   Otherwise, review of systems is positive for none.   All other systems are reviewed and negative.   PHYSICAL EXAM: VS:  BP 130/84   Pulse 81   Ht '5\' 1"'$  (1.549 m)   Wt (!) 344 lb (156 kg)   SpO2 97%   BMI 65.00 kg/m  , BMI Body  mass index is 65 kg/m. GEN: Well nourished, well developed, in no acute distress  HEENT: normal  Neck: no JVD, carotid bruits, or masses Cardiac: RRR; no murmurs, rubs, or gallops,no edema  Respiratory:  clear to auscultation bilaterally, normal work of breathing GI: soft, nontender, nondistended, + BS MS: no deformity or atrophy  Skin: warm and dry, device site well healed Neuro:  Strength and sensation are intact Psych: euthymic mood, full affect  EKG:  EKG is ordered today. Personal review of the ekg ordered shows atrial paced, PACs, rate 81  Personal review of the device interrogation today. Results in [place]: 10/03/2022: BUN 13; Creatinine, Ser 0.78; Magnesium 1.5; Potassium 4.4; Sodium 141    Lipid Panel     Component Value Date/Time   CHOL 165 09/13/2021 1004   TRIG 219 (H) 09/13/2021 1004   HDL 49 09/13/2021 1004   CHOLHDL 3.4 09/13/2021 1004   LDLCALC 80 09/13/2021 1004     Wt Readings from Last 3 Encounters:   01/15/23 (!) 344 lb (156 kg)  10/16/22 (!) 339 lb (153.8 kg)  10/03/22 (!) 345 lb 6.4 oz (156.7 kg)      Other studies Reviewed: Additional studies/ records that were reviewed today include: TTE 12/19/22 Review of the above records today demonstrates:   1. Left ventricular ejection fraction, by estimation, is 50 to 55%. The  left ventricle has low normal function. The left ventricle demonstrates  regional wall motion abnormalities (see scoring diagram/findings for  description). Left ventricular diastolic   parameters are consistent with Grade III diastolic dysfunction  (restrictive). Elevated left atrial pressure.   2. Right ventricular systolic function is mildly reduced. The right  ventricular size is normal. There is normal pulmonary artery systolic  pressure.   3. The mitral valve is degenerative. No evidence of mitral valve  regurgitation. No evidence of mitral stenosis.   4. Her post op Echo at [facility] 07/30/2017:      Aortic: No AR MECH PROSTHETIC AoV      3.1 m/s peak vel 38 mmHg peak grad 24 mmHg mean grad 1.1 cm2 by  DOPPLER     LVOT Diam: 2.0 cm. Resting LVOT Vel: 1.1 m/s. Dimensionless Index:  0.36     . Aortic valve regurgitation is not visualized. There is a 21 mm St.  Jude valve present in the aortic position. Procedure Date: 07/24/2017. Echo  findings are consistent with normal structure and function of the aortic  valve prosthesis.   5. The inferior vena cava is normal in size with greater than 50%  respiratory variability, suggesting right atrial pressure of 3 mmHg.     ASSESSMENT AND PLAN:  1.  Ventricular tachycardia: Status postcardiac arrest.  Post [employer] ICD implanted in 2015.  Currently on mexiletine 300 mg twice daily.  On higher doses of metoprolol and mexiletine.  No changes.  2.  Aortic valve/root replacement: Plan per primary cardiology  3.  Hypertension: Currently well-controlled  4.  Obesity: Lifestyle modification encouraged Body  mass index is 65 kg/m.  5.  Obstructive sleep apnea: CPAP compliance encouraged  6.  PACs: Currently on metoprolol.  Control is better on higher dose metoprolol.    Current medicines are reviewed at length with the patient today.   The patient does not have concerns regarding her medicines.  The following changes were made today: None  Labs/ tests ordered today include:  Orders Placed This Encounter  Procedures   EKG 12-Lead  Disposition:   FU 6 months  Signed, Nary Sneed Meredith Leeds, MD  01/15/2023 4:20 PM     [facility] [address] [place] [address] [phone] (office) [phone] (fax)

## 2023-01-15 NOTE — Patient Instructions (Signed)
Medication Instructions:  °Your physician recommends that you continue on your current medications as directed. Please refer to the Current Medication list given to you today. ° °*If you need a refill on your cardiac medications before your next appointment, please call your pharmacy* ° ° °Lab Work: °None ordered ° ° °Testing/Procedures: °None ordered ° ° °Follow-Up: °At [facility], you and your health needs are our priority.  As part of our continuing mission to provide you with exceptional heart care, we have created designated Provider Care Teams.  These Care Teams include your primary Cardiologist (physician) and Advanced Practice Providers (APPs -  Physician Assistants and Nurse Practitioners) who all work together to provide you with the care you need, when you need it. ° °Your next appointment:   °6 month(s) ° °The format for your next appointment:   °In Person ° °Provider:   °Will Camnitz, MD ° ° ° °Thank you for choosing [facility]!! ° ° °Rosaria Kubin, RN °[phone] °  °

## 2023-01-23 DIAGNOSIS — R519 Headache, unspecified: Secondary | ICD-10-CM | POA: Diagnosis not present

## 2023-02-02 ENCOUNTER — Other Ambulatory Visit: Payer: Self-pay

## 2023-02-02 ENCOUNTER — Telehealth: Payer: Self-pay | Admitting: Cardiology

## 2023-02-02 NOTE — Telephone Encounter (Signed)
Patient called stating she was prescribed Prednisone by her PCP and she wants to make sure it's okay to take. Spoke with Dr. Agustin Cree regarding this patient's question and  he stated that it would be fine for her to take that medication. Called the patient and informed her of Dr. Wendy Poet recommendation and she had no further questions at this time.

## 2023-02-02 NOTE — Telephone Encounter (Signed)
Pt c/o medication issue:  1. Name of Medication: Prednisone 20mg    2. How are you currently taking this medication (dosage and times per day)?   3. Are you having a reaction (difficulty breathing--STAT)? no  4. What is your medication issue? Patient called stating she was prescribed this medication by her PCP she wants to make sure it's okay to take.

## 2023-02-06 ENCOUNTER — Ambulatory Visit: Payer: BC Managed Care – PPO | Attending: Cardiology

## 2023-02-06 DIAGNOSIS — Z5181 Encounter for therapeutic drug level monitoring: Secondary | ICD-10-CM | POA: Diagnosis not present

## 2023-02-06 DIAGNOSIS — Z952 Presence of prosthetic heart valve: Secondary | ICD-10-CM | POA: Diagnosis not present

## 2023-02-06 LAB — POCT INR: INR: 2.8 (ref 2.0–3.0)

## 2023-02-06 NOTE — Patient Instructions (Signed)
Description   ?Continue 2 tablets daily.  Recheck INR in 6 weeks at the [place] office. Please call the office with any medication changes or additions [phone].  ?  ?   ?

## 2023-02-13 DIAGNOSIS — Z6841 Body Mass Index (BMI) 40.0 and over, adult: Secondary | ICD-10-CM | POA: Diagnosis not present

## 2023-02-13 DIAGNOSIS — Z1231 Encounter for screening mammogram for malignant neoplasm of breast: Secondary | ICD-10-CM | POA: Diagnosis not present

## 2023-02-13 DIAGNOSIS — Z124 Encounter for screening for malignant neoplasm of cervix: Secondary | ICD-10-CM | POA: Diagnosis not present

## 2023-02-13 DIAGNOSIS — Z01419 Encounter for gynecological examination (general) (routine) without abnormal findings: Secondary | ICD-10-CM | POA: Diagnosis not present

## 2023-03-01 ENCOUNTER — Ambulatory Visit (INDEPENDENT_AMBULATORY_CARE_PROVIDER_SITE_OTHER): Payer: BC Managed Care – PPO

## 2023-03-01 DIAGNOSIS — I472 Ventricular tachycardia, unspecified: Secondary | ICD-10-CM

## 2023-03-01 LAB — CUP PACEART REMOTE DEVICE CHECK
Battery Remaining Longevity: 66 mo
Battery Remaining Percentage: 76 %
Brady Statistic RA Percent Paced: 66 %
Brady Statistic RV Percent Paced: 1 %
Date Time Interrogation Session: 20240418030200
HighPow Impedance: 85 Ohm
Implantable Lead Connection Status: 753985
Implantable Lead Connection Status: 753985
Implantable Lead Implant Date: 20151228
Implantable Lead Implant Date: 20151228
Implantable Lead Location: 753859
Implantable Lead Location: 753860
Implantable Lead Model: 292
Implantable Lead Model: 5076
Implantable Lead Serial Number: 358765
Implantable Pulse Generator Implant Date: 20151228
Lead Channel Impedance Value: 445 Ohm
Lead Channel Impedance Value: 516 Ohm
Lead Channel Pacing Threshold Amplitude: 0.5 V
Lead Channel Pacing Threshold Amplitude: 0.9 V
Lead Channel Pacing Threshold Pulse Width: 0.4 ms
Lead Channel Pacing Threshold Pulse Width: 0.4 ms
Lead Channel Setting Pacing Amplitude: 2 V
Lead Channel Setting Pacing Amplitude: 2.4 V
Lead Channel Setting Pacing Pulse Width: 0.4 ms
Lead Channel Setting Sensing Sensitivity: 0.5 mV
Pulse Gen Serial Number: 508420
Zone Setting Status: 755011

## 2023-03-20 ENCOUNTER — Ambulatory Visit: Payer: BC Managed Care – PPO | Attending: Cardiology

## 2023-03-20 DIAGNOSIS — Z5181 Encounter for therapeutic drug level monitoring: Secondary | ICD-10-CM

## 2023-03-20 DIAGNOSIS — Z952 Presence of prosthetic heart valve: Secondary | ICD-10-CM

## 2023-03-20 LAB — POCT INR: INR: 2.2 (ref 2.0–3.0)

## 2023-03-20 NOTE — Patient Instructions (Signed)
Description   ?Continue 2 tablets daily.  Recheck INR in 6 weeks at the [place] office. Please call the office with any medication changes or additions [phone].  ?  ?   ?

## 2023-04-01 ENCOUNTER — Other Ambulatory Visit: Payer: Self-pay | Admitting: Cardiology

## 2023-04-02 NOTE — Progress Notes (Signed)
Remote ICD transmission.   

## 2023-04-24 ENCOUNTER — Other Ambulatory Visit: Payer: Self-pay | Admitting: Cardiology

## 2023-04-24 ENCOUNTER — Other Ambulatory Visit: Payer: Self-pay

## 2023-04-24 NOTE — Telephone Encounter (Signed)
Rx sent to pharmacy   

## 2023-05-01 ENCOUNTER — Ambulatory Visit: Payer: BC Managed Care – PPO | Attending: Cardiology

## 2023-05-01 DIAGNOSIS — Z952 Presence of prosthetic heart valve: Secondary | ICD-10-CM | POA: Diagnosis not present

## 2023-05-01 DIAGNOSIS — Z5181 Encounter for therapeutic drug level monitoring: Secondary | ICD-10-CM

## 2023-05-01 DIAGNOSIS — Z86711 Personal history of pulmonary embolism: Secondary | ICD-10-CM

## 2023-05-01 LAB — POCT INR: INR: 3.1 — AB (ref 2.0–3.0)

## 2023-05-01 NOTE — Patient Instructions (Signed)
Description   Eat greens today and continue 2 tablets daily.  Recheck INR in 6 weeks at the [place] office.  Please call the office with any medication changes or additions [phone].

## 2023-05-02 ENCOUNTER — Telehealth: Payer: Self-pay | Admitting: Cardiology

## 2023-05-02 NOTE — Telephone Encounter (Signed)
Pt c/o medication issue:  1. Name of Medication:   Prilosec  2. How are you currently taking this medication (dosage and times per day)?  Has not taken as yet  3. Are you having a reaction (difficulty breathing--STAT)?   4. What is your medication issue?   Patient wants to know if it is OK for her to take this OTC medication.

## 2023-05-02 NOTE — Telephone Encounter (Signed)
Patient was calling for update. Please advise  

## 2023-05-02 NOTE — Telephone Encounter (Signed)
Patient wants to know if it is ok for her to take over the counter Prilosec medication? Please advise.

## 2023-05-03 NOTE — Telephone Encounter (Signed)
Yes ok for patient to take. Recommend 14 day trial

## 2023-05-03 NOTE — Telephone Encounter (Signed)
Called patient and informed her of the recommendation from [facility], the clinical pharmacist, below regarding starting Prilosec:  "Yes ok for patient to take. Recommend 14 day trial"  Patient verbalized understanding and had no further questions at this time.

## 2023-05-21 DIAGNOSIS — Z6841 Body Mass Index (BMI) 40.0 and over, adult: Secondary | ICD-10-CM | POA: Diagnosis not present

## 2023-05-21 DIAGNOSIS — W57XXXA Bitten or stung by nonvenomous insect and other nonvenomous arthropods, initial encounter: Secondary | ICD-10-CM | POA: Diagnosis not present

## 2023-05-21 DIAGNOSIS — R21 Rash and other nonspecific skin eruption: Secondary | ICD-10-CM | POA: Diagnosis not present

## 2023-05-31 ENCOUNTER — Ambulatory Visit (INDEPENDENT_AMBULATORY_CARE_PROVIDER_SITE_OTHER): Payer: BC Managed Care – PPO

## 2023-05-31 DIAGNOSIS — I469 Cardiac arrest, cause unspecified: Secondary | ICD-10-CM

## 2023-05-31 LAB — CUP PACEART REMOTE DEVICE CHECK
Battery Remaining Longevity: 66 mo
Battery Remaining Percentage: 72 %
Brady Statistic RA Percent Paced: 67 %
Brady Statistic RV Percent Paced: 3 %
Date Time Interrogation Session: 20240718030200
HighPow Impedance: 74 Ohm
Implantable Lead Connection Status: 753985
Implantable Lead Connection Status: 753985
Implantable Lead Implant Date: 20151228
Implantable Lead Implant Date: 20151228
Implantable Lead Location: 753859
Implantable Lead Location: 753860
Implantable Lead Model: 292
Implantable Lead Model: 5076
Implantable Lead Serial Number: 358765
Implantable Pulse Generator Implant Date: 20151228
Lead Channel Impedance Value: 458 Ohm
Lead Channel Impedance Value: 714 Ohm
Lead Channel Pacing Threshold Amplitude: 0.5 V
Lead Channel Pacing Threshold Amplitude: 0.9 V
Lead Channel Pacing Threshold Pulse Width: 0.4 ms
Lead Channel Pacing Threshold Pulse Width: 0.4 ms
Lead Channel Setting Pacing Amplitude: 2 V
Lead Channel Setting Pacing Amplitude: 2.4 V
Lead Channel Setting Pacing Pulse Width: 0.4 ms
Lead Channel Setting Sensing Sensitivity: 0.5 mV
Pulse Gen Serial Number: 508420
Zone Setting Status: 755011

## 2023-06-01 ENCOUNTER — Other Ambulatory Visit: Payer: Self-pay

## 2023-06-01 MED ORDER — METOPROLOL SUCCINATE ER 100 MG PO TB24: 100.0000 mg | ORAL_TABLET | Freq: Two times a day (BID) | ORAL | 2 refills | Status: DC

## 2023-06-11 DIAGNOSIS — Z6841 Body Mass Index (BMI) 40.0 and over, adult: Secondary | ICD-10-CM | POA: Diagnosis not present

## 2023-06-11 DIAGNOSIS — Z1339 Encounter for screening examination for other mental health and behavioral disorders: Secondary | ICD-10-CM | POA: Diagnosis not present

## 2023-06-11 DIAGNOSIS — Z1331 Encounter for screening for depression: Secondary | ICD-10-CM | POA: Diagnosis not present

## 2023-06-11 DIAGNOSIS — M545 Low back pain, unspecified: Secondary | ICD-10-CM | POA: Diagnosis not present

## 2023-06-12 ENCOUNTER — Ambulatory Visit: Payer: BC Managed Care – PPO | Attending: Cardiology

## 2023-06-12 DIAGNOSIS — Z5181 Encounter for therapeutic drug level monitoring: Secondary | ICD-10-CM | POA: Diagnosis not present

## 2023-06-12 DIAGNOSIS — Z952 Presence of prosthetic heart valve: Secondary | ICD-10-CM

## 2023-06-12 LAB — POCT INR: INR: 2.9 (ref 2.0–3.0)

## 2023-06-12 NOTE — Patient Instructions (Signed)
Description   Since you are on Prednisone, Take 1 tablet today and 1 tablet tomorrow and then resume taking 2 tablets daily.  Recheck INR in 1 week at the [place] office.  Please call the office with any medication changes or additions [phone].

## 2023-06-14 NOTE — Progress Notes (Signed)
Remote ICD transmission.   

## 2023-06-19 ENCOUNTER — Ambulatory Visit: Payer: BC Managed Care – PPO

## 2023-06-19 DIAGNOSIS — Z952 Presence of prosthetic heart valve: Secondary | ICD-10-CM | POA: Diagnosis not present

## 2023-06-19 DIAGNOSIS — Z5181 Encounter for therapeutic drug level monitoring: Secondary | ICD-10-CM

## 2023-06-19 LAB — POCT INR: INR: 2.1 (ref 2.0–3.0)

## 2023-06-19 NOTE — Patient Instructions (Signed)
Description   Resume taking 2 tablets daily.  Recheck INR in 4 weeks at the [place] office.  Please call the office with any medication changes or additions [phone].

## 2023-07-09 ENCOUNTER — Encounter: Payer: BC Managed Care – PPO | Admitting: Cardiology

## 2023-07-17 ENCOUNTER — Ambulatory Visit: Payer: BC Managed Care – PPO | Attending: Cardiology

## 2023-07-17 DIAGNOSIS — Z952 Presence of prosthetic heart valve: Secondary | ICD-10-CM

## 2023-07-17 DIAGNOSIS — Z5181 Encounter for therapeutic drug level monitoring: Secondary | ICD-10-CM | POA: Diagnosis not present

## 2023-07-17 LAB — POCT INR: INR: 2.3 (ref 2.0–3.0)

## 2023-07-17 NOTE — Patient Instructions (Signed)
Description   Continue taking 2 tablets daily.  Recheck INR in 5 weeks at the [place] office.  Please call the office with any medication changes or additions [phone].

## 2023-07-21 ENCOUNTER — Other Ambulatory Visit: Payer: Self-pay | Admitting: Cardiology

## 2023-07-21 DIAGNOSIS — Z952 Presence of prosthetic heart valve: Secondary | ICD-10-CM

## 2023-07-23 NOTE — Telephone Encounter (Signed)
Prescription refill request received for warfarin [place]: 01/15/23 ([place])    Next INR check: 08/21/23)  Warfarin tablet strength: 5mg   Appropriate dose. Refill sent.

## 2023-08-08 ENCOUNTER — Ambulatory Visit: Payer: BC Managed Care – PPO | Admitting: Cardiology

## 2023-08-16 DIAGNOSIS — R070 Pain in throat: Secondary | ICD-10-CM | POA: Diagnosis not present

## 2023-08-16 DIAGNOSIS — Z6841 Body Mass Index (BMI) 40.0 and over, adult: Secondary | ICD-10-CM | POA: Diagnosis not present

## 2023-08-21 ENCOUNTER — Ambulatory Visit: Payer: BC Managed Care – PPO | Attending: Cardiology

## 2023-08-21 DIAGNOSIS — Z5181 Encounter for therapeutic drug level monitoring: Secondary | ICD-10-CM | POA: Diagnosis not present

## 2023-08-21 DIAGNOSIS — Z86711 Personal history of pulmonary embolism: Secondary | ICD-10-CM | POA: Diagnosis not present

## 2023-08-21 DIAGNOSIS — Z952 Presence of prosthetic heart valve: Secondary | ICD-10-CM | POA: Diagnosis not present

## 2023-08-21 LAB — POCT INR: INR: 2.5 (ref 2.0–3.0)

## 2023-08-21 NOTE — Patient Instructions (Signed)
Description   Continue taking 2 tablets daily.  Recheck INR in 6 weeks at the [place] office.  Please call the office with any medication changes or additions [phone].

## 2023-08-27 ENCOUNTER — Encounter: Payer: Self-pay | Admitting: Cardiology

## 2023-08-27 ENCOUNTER — Ambulatory Visit: Payer: BC Managed Care – PPO | Attending: Cardiology | Admitting: Cardiology

## 2023-08-27 VITALS — BP 120/76 | HR 73 | Ht 61.25 in | Wt 337.2 lb

## 2023-08-27 DIAGNOSIS — G4733 Obstructive sleep apnea (adult) (pediatric): Secondary | ICD-10-CM

## 2023-08-27 DIAGNOSIS — I472 Ventricular tachycardia, unspecified: Secondary | ICD-10-CM

## 2023-08-27 DIAGNOSIS — I10 Essential (primary) hypertension: Secondary | ICD-10-CM | POA: Diagnosis not present

## 2023-08-27 LAB — CUP PACEART INCLINIC DEVICE CHECK
Date Time Interrogation Session: 20241014151724
HighPow Impedance: 84 Ohm
Implantable Lead Connection Status: 753985
Implantable Lead Connection Status: 753985
Implantable Lead Implant Date: 20151228
Implantable Lead Implant Date: 20151228
Implantable Lead Location: 753859
Implantable Lead Location: 753860
Implantable Lead Model: 292
Implantable Lead Model: 5076
Implantable Lead Serial Number: 358765
Implantable Pulse Generator Implant Date: 20151228
Lead Channel Impedance Value: 453 Ohm
Lead Channel Impedance Value: 540 Ohm
Lead Channel Pacing Threshold Amplitude: 0.6 V
Lead Channel Pacing Threshold Amplitude: 0.9 V
Lead Channel Pacing Threshold Amplitude: 1 V
Lead Channel Pacing Threshold Pulse Width: 0.4 ms
Lead Channel Pacing Threshold Pulse Width: 0.4 ms
Lead Channel Pacing Threshold Pulse Width: 0.4 ms
Lead Channel Sensing Intrinsic Amplitude: 10.6 mV
Lead Channel Sensing Intrinsic Amplitude: 3.4 mV
Lead Channel Setting Pacing Amplitude: 2 V
Lead Channel Setting Pacing Amplitude: 2.4 V
Lead Channel Setting Pacing Pulse Width: 0.4 ms
Lead Channel Setting Sensing Sensitivity: 0.5 mV
Pulse Gen Serial Number: 508420
Zone Setting Status: 755011

## 2023-08-27 NOTE — Progress Notes (Signed)
  Electrophysiology Office Note:   Date:  08/27/2023  ID:  Heather Mosley, DOB 16-Feb-1966, MRN [id number]  Primary Cardiologist: Gypsy Balsam, MD Electrophysiologist: Regan Lemming, MD      History of Present Illness:   Heather Mosley is a 57 y.o. female with h/o aortic root/valve replacement with porcine valve and extended aortic arch and hemiarch replacement in 2006, obesity, hypertension, TAVR 2015, PEA arrest post ICD seen today for routine electrophysiology followup.   Since last being seen in our clinic the patient reports doing overall well.  She has no chest pain or shortness of breath any awareness of arrhythmia.  She continues to do her daily activities without restriction..  she denies chest pain, palpitations, dyspnea, PND, orthopnea, nausea, vomiting, dizziness, syncope, edema, weight gain, or early satiety.   Review of systems complete and found to be negative unless listed in HPI.      EP Information / Studies Reviewed:    EKG is ordered today. Personal review as below.  EKG Interpretation Date/Time:  Monday August 27 2023 12:09:09 EDT Ventricular Rate:  77 PR Interval:  232 QRS Duration:  86 QT Interval:  402 QTC Calculation: 454 R Axis:   89  Text Interpretation: Atrial-paced rhythm with prolonged AV conduction with Premature supraventricular complexes Nonspecific ST abnormality When compared with ECG of 07-Jun-2017 11:28, Electronic atrial pacemaker has replaced Sinus rhythm Confirmed by Verlene Glantz ([id number]) on 08/27/2023 12:19:09 PM   ICD Interrogation-  reviewed in detail today,  See PACEART report.  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 History of appropriate therapy: No History of AAD therapy: Yes; currently on mexiletine    Risk Assessment/Calculations:              Physical Exam:   VS:  BP 120/76   Pulse 73   Ht 5' 1.25" (1.556 m)   Wt (!) 337 lb 3.2 oz (153 kg)   SpO2 94%   BMI 63.19 kg/m    Wt  Readings from Last 3 Encounters:  08/27/23 (!) 337 lb 3.2 oz (153 kg)  01/15/23 (!) 344 lb (156 kg)  10/16/22 (!) 339 lb (153.8 kg)     GEN: Well nourished, well developed in no acute distress NECK: No JVD; No carotid bruits CARDIAC: Regular rate and rhythm, no murmurs, rubs, gallops RESPIRATORY:  Clear to auscultation without rales, wheezing or rhonchi  ABDOMEN: Soft, non-tender, non-distended EXTREMITIES:  No edema; No deformity   ASSESSMENT AND PLAN:    Ventricular tachycardia s/p [employer] dual chamber ICD  euvolemic today Stable on an appropriate medical regimen Normal ICD function See Pace Art report No changes today  2.  Aortic root/valve replacement: Plan per primary cardiology  3.  Hypertension: Currently well-controlled  4.  Obesity: Lifestyle modification encouraged  5.  Obstructive sleep apnea: CPAP compliance encouraged  6.  PACs: Continue metoprolol  Disposition:   Follow up with Dr. Elberta Fortis in 12 months   Signed, Zelena Bushong Jorja Loa, MD

## 2023-08-27 NOTE — Patient Instructions (Signed)
Medication Instructions:  Your physician recommends that you continue on your current medications as directed. Please refer to the Current Medication list given to you today.  *If you need a refill on your cardiac medications before your next appointment, please call your pharmacy*   Lab Work: None ordered   Testing/Procedures: None ordered   Follow-Up: At [facility], you and your health needs are our priority.  As part of our continuing mission to provide you with exceptional heart care, we have created designated Provider Care Teams.  These Care Teams include your primary Cardiologist (physician) and Advanced Practice Providers (APPs -  Physician Assistants and Nurse Practitioners) who all work together to provide you with the care you need, when you need it.  Remote monitoring is used to monitor your Pacemaker or ICD from home. This monitoring reduces the number of office visits required to check your device to one time per year. It allows [place] to keep an eye on the functioning of your device to ensure it is working properly. You are scheduled for a device check from home on 11/29/2023. You may send your transmission at any time that day. If you have a wireless device, the transmission will be sent automatically. After your physician reviews your transmission, you will receive a postcard with your next transmission date.  Your next appointment:   1 year(s)  The format for your next appointment:   In Person  Provider:   Loman Brooklyn, MD    Thank you for choosing [facility]!!   Dory Horn, RN [phone]    Other Instructions

## 2023-08-30 ENCOUNTER — Ambulatory Visit: Payer: Self-pay

## 2023-09-17 ENCOUNTER — Other Ambulatory Visit: Payer: Self-pay

## 2023-09-17 MED ORDER — ATORVASTATIN CALCIUM 80 MG PO TABS: 80.0000 mg | ORAL_TABLET | Freq: Every day | ORAL | 0 refills | Status: DC

## 2023-10-02 ENCOUNTER — Ambulatory Visit: Payer: BC Managed Care – PPO | Attending: Cardiology

## 2023-10-02 DIAGNOSIS — Z5181 Encounter for therapeutic drug level monitoring: Secondary | ICD-10-CM | POA: Diagnosis not present

## 2023-10-02 DIAGNOSIS — Z952 Presence of prosthetic heart valve: Secondary | ICD-10-CM | POA: Diagnosis not present

## 2023-10-02 DIAGNOSIS — Z86711 Personal history of pulmonary embolism: Secondary | ICD-10-CM

## 2023-10-02 LAB — POCT INR: INR: 2.4 (ref 2.0–3.0)

## 2023-10-02 NOTE — Patient Instructions (Signed)
Description   Continue taking 2 tablets daily.  Recheck INR in 7 weeks at the [place] office.  Please call the office with any medication changes or additions [phone].

## 2023-10-16 ENCOUNTER — Other Ambulatory Visit: Payer: Self-pay | Admitting: Cardiology

## 2023-10-17 ENCOUNTER — Ambulatory Visit: Payer: BC Managed Care – PPO | Attending: Cardiology | Admitting: Cardiology

## 2023-10-17 ENCOUNTER — Encounter: Payer: Self-pay | Admitting: Cardiology

## 2023-10-17 VITALS — BP 148/78 | HR 78 | Ht 61.25 in | Wt 344.0 lb

## 2023-10-17 DIAGNOSIS — Z95810 Presence of automatic (implantable) cardiac defibrillator: Secondary | ICD-10-CM | POA: Diagnosis not present

## 2023-10-17 DIAGNOSIS — Z952 Presence of prosthetic heart valve: Secondary | ICD-10-CM | POA: Diagnosis not present

## 2023-10-17 DIAGNOSIS — Z954 Presence of other heart-valve replacement: Secondary | ICD-10-CM | POA: Diagnosis not present

## 2023-10-17 NOTE — Patient Instructions (Signed)
Medication Instructions:  Your physician recommends that you continue on your current medications as directed. Please refer to the Current Medication list given to you today.  *If you need a refill on your cardiac medications before your next appointment, please call your pharmacy*   Lab Work: None Ordered If you have labs (blood work) drawn today and your tests are completely normal, you will receive your results only by: MyChart Message (if you have MyChart) OR A paper copy in the mail If you have any lab test that is abnormal or we need to change your treatment, we will call you to review the results.   Testing/Procedures: February Your physician has requested that you have an echocardiogram. Echocardiography is a painless test that uses sound waves to create images of your heart. It provides your doctor with information about the size and shape of your heart and how well your heart's chambers and valves are working. This procedure takes approximately one hour. There are no restrictions for this procedure. Please do NOT wear cologne, perfume, aftershave, or lotions (deodorant is allowed). Please arrive 15 minutes prior to your appointment time.  Please note: We ask at that you not bring children with you during ultrasound (echo/ vascular) testing. Due to room size and safety concerns, children are not allowed in the ultrasound rooms during exams. Our front office staff cannot provide observation of children in our lobby area while testing is being conducted. An adult accompanying a patient to their appointment will only be allowed in the ultrasound room at the discretion of the ultrasound technician under special circumstances. We apologize for any inconvenience.    Follow-Up: At [facility], you and your health needs are our priority.  As part of our continuing mission to provide you with exceptional heart care, we have created designated Provider Care Teams.  These Care Teams include  your primary Cardiologist (physician) and Advanced Practice Providers (APPs -  Physician Assistants and Nurse Practitioners) who all work together to provide you with the care you need, when you need it.  We recommend signing up for the patient portal called "MyChart".  Sign up information is provided on this After Visit Summary.  MyChart is used to connect with patients for Virtual Visits (Telemedicine).  Patients are able to view lab/test results, encounter notes, upcoming appointments, etc.  Non-urgent messages can be sent to your provider as well.   To learn more about what you can do with MyChart, go to [email or web address].    Your next appointment:   6 month(s)  The format for your next appointment:   In Person  Provider:   Gypsy Balsam, MD    Other Instructions NA

## 2023-10-17 NOTE — Progress Notes (Signed)
Cardiology Office Note:    Date:  10/17/2023   ID:  Heather Mosley, DOB 03-22-1966, MRN [id number]  PCP:  Lise Auer, MD  Cardiologist:  Gypsy Balsam, MD    Referring MD: Lise Auer, MD   Chief Complaint  Patient presents with   Follow-up    History of Present Illness:    Heather Mosley is a 57 y.o. female  with very complex past medical history. Many years ago she did have aortic root replacement with bioprosthetic arctic valve that eventually failed after that she got TAVR implanted that fell fairly quickly after that and finally she ended up with scheduled 21 mm valve done in July 24, 2017 at [facility]. Her course was complicated by pulmonary emboli, cardiac arrest, she does have an ICD. Additional problems include dyslipidemia, morbidly obese.  Comes today to months for follow-up overall doing fine denies having chest pain tightness squeezing pressure burning chest.  Shortness of breath is always there but only mild she did not notice any decrease in ability to exercise, no discharge from the defibrillator  Past Medical History:  Diagnosis Date   Acute on chronic systolic ACC/AHA stage C congestive heart failure (HCC) 11/09/2014   AICD (automatic cardioverter/defibrillator) present 2015   AKI (acute kidney injury) (HCC) 07/24/2017   Aortic valvular stenoses    "born w/it"   Arthritis    "knees" (06/07/2017)   Cardiac arrest (HCC) 11/03/2014   Chest pain 06/07/2017   Congenital heart defect    Encounter for therapeutic drug monitoring 09/11/2017   Exertional dyspnea 07/16/2017   Heart murmur    High cholesterol    History of biliary T-tube placement 11/09/2014   Overview:  Overview:  BSX DC ICD implanted 11/09/14 (Followed by [facility])  Formatting of this note might be different from the original. Overview:  Overview:  BSX DC ICD implanted 11/09/14 (Followed by [facility]) Formatting of this note might be different from the original. Overview:  BSX DC ICD implanted  11/09/14 (Followed by [facility])   History of cardiac arrest 06/06/2017   History of pulmonary embolism 06/06/2017   Hypertension    Hypoxemia 07/24/2017   ICD (implantable cardioverter-defibrillator) in place 05/24/2016   Lactate blood increased 07/24/2017   Nonrheumatic aortic valve insufficiency 09/18/2014   Obesity 06/02/2015   OSA on CPAP    Pulmonary embolism (HCC) 2015   Right ventricular systolic dysfunction 07/24/2017   S/P AVR (aortic valve replacement) 07/27/2017   St Jude Valve 21 mm - 07/24/2017 [facility]   S/P ICD (internal cardiac defibrillator) procedure 11/09/2014   Overview:  BSX DC ICD implanted 11/09/14 (Followed by [facility])  Formatting of this note might be different from the original. 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 She initially underwent aortic valve commissurotomy in 1985 with subsequent aortic root replacement with a porcine valve and ascending aorta and hemiarch replacement in 2006. She subsequently developed severe symptomatic prosthetic aortic valve regurgitation and underwent successful valve and valve transcatheter aortic valve replacement with a   Unspecified dyspareunia (CODE) 11/23/2020    Past Surgical History:  Procedure Laterality Date   ANOMALOUS PULMONARY VENOUS RETURN REPAIR, TOTAL  2015   AORTIC VALVE REPAIR  1985   AORTIC VALVE REPLACEMENT  2006  aortic valve/aortic root replacement with porcine valve, and extending aorta and hemi-arch replacement /notes 06/07/2017   AORTIC VALVE REPLACEMENT     CARDIAC CATHETERIZATION  1985; 2006; 2015   "prior to my ORs those years"   CARDIAC DEFIBRILLATOR PLACEMENT  2015   CARDIAC VALVE REPLACEMENT     CESAREAN SECTION  2008   LEFT  HEART CATH AND CORONARY ANGIOGRAPHY N/A 06/08/2017   Procedure: Left Heart Cath and Coronary Angiography;  Surgeon: Lyn Records, MD;  Location: [facility] INVASIVE CV LAB;  Service: Cardiovascular;  Laterality: N/A;    Current Medications: Current Meds  Medication Sig   acetaminophen (TYLENOL) 325 MG tablet Take 650 mg by mouth 2 (two) times daily as needed for moderate pain or headache.    aspirin EC 81 MG tablet Take 81 mg by mouth daily.   atorvastatin (LIPITOR) 80 MG tablet Take 1 tablet (80 mg total) by mouth daily.   FUROSEMIDE PO Take 20 mg by mouth as needed (fluid retention).   Glucosamine 500 MG CAPS Take 2 capsules by mouth daily.   metoprolol succinate (TOPROL-XL) 100 MG 24 hr tablet Take 1 tablet (100 mg total) by mouth in the morning and at bedtime. Take with or immediately following a meal.   mexiletine (MEXITIL) 150 MG capsule TAKE 2 CAPSULES(300 MG) BY MOUTH TWICE DAILY   nitroGLYCERIN (NITROSTAT) 0.4 MG SL tablet PLACE 1 TABLET UNDER THE TONGUE EVERY 5 MINUTES FOR 3 DOSES AS NEEDED FOR CHEST PAIN   potassium chloride (KLOR-CON) 10 MEQ tablet Take 1 tablet (10 mEq total) by mouth as needed.   warfarin (COUMADIN) 5 MG tablet TAKE 2 TABLETS BY MOUTH DAILY OR AS DIRECTED BY COUMADIN CLINIC     Allergies:   Tape   Social History   Socioeconomic History   Marital status: Married    Spouse name: Not on file   Number of children: Not on file   Years of education: Not on file   Highest education level: Not on file  Occupational History   Not on file  Tobacco Use   Smoking status: Never   Smokeless tobacco: Never  Vaping Use   Vaping status: Never Used  Substance and Sexual Activity   Alcohol use: No   Drug use: No   Sexual activity: Yes  Other Topics Concern   Not on file  Social History Narrative   Not on file   Social Determinants of Health   Financial Resource Strain: Not on file  Food Insecurity: Not on file  Transportation Needs: Not on file  Physical  Activity: Not on file  Stress: Not on file  Social Connections: Not on file     Family History: The patient's family history includes Hyperlipidemia in her father; Hypertension in her mother; Leukemia in her father. ROS:   Please see the history of present illness.    All 14 point review of systems negative except as described per history of present illness  EKGs/Labs/Other Studies Reviewed:         Recent Labs: No results found for requested labs within last 365 days.  Recent Lipid Panel    Component Value Date/Time   CHOL 165 09/13/2021 1004   TRIG 219 (H) 09/13/2021 1004   HDL 49 09/13/2021 1004   CHOLHDL 3.4 09/13/2021 1004   LDLCALC 80 09/13/2021 1004    Physical Exam:    VS:  BP (!) 160/101 (BP Location: Right Arm, Patient Position: Sitting, Cuff Size: Large)   Pulse 78  Ht 5' 1.25" (1.556 m)   Wt (!) 344 lb (156 kg)   SpO2 95%   BMI 64.47 kg/m     Wt Readings from Last 3 Encounters:  10/17/23 (!) 344 lb (156 kg)  08/27/23 (!) 337 lb 3.2 oz (153 kg)  01/15/23 (!) 344 lb (156 kg)     GEN:  Well nourished, well developed in no acute distress HEENT: Normal NECK: No JVD; No carotid bruits LYMPHATICS: No lymphadenopathy CARDIAC: RRR, crisp mechanical valve sounds, soft systolic murmur grade 2 out of 6 right upper portion of the sternum, no rubs, no gallops RESPIRATORY:  Clear to auscultation without rales, wheezing or rhonchi  ABDOMEN: Soft, non-tender, non-distended MUSCULOSKELETAL:  No edema; No deformity  SKIN: Warm and dry LOWER EXTREMITIES: no swelling NEUROLOGIC:  Alert and oriented x 3 PSYCHIATRIC:  Normal affect   ASSESSMENT:    1. S/P TAVR (transcatheter aortic valve replacement)   2. S/P AVR (aortic valve replacement)   3. S/P ICD (internal cardiac defibrillator) procedure   4. 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 AICD (automatic cardioverter/defibrillator) present    PLAN:     In order of problems listed above:  Very complex past medical history.  Status post [facility] Jude aortic valve prosthesis, she is on Coumadin.  Doing well.  In February we will repeat echocardiogram. ICD present I did review interrogation normal function no discharges no detection continue monitoring. Dyslipidemia she is taking Lipitor 80 which I will continue I did review K PN which show LDL 80 HDL 49 continue present management. Elevated blood pressure today obviously concerning that she was rushing she was late today when she was overly upset about it we will simply recheck left knee before she gets out of the room   Medication Adjustments/Labs and Tests Ordered: Current medicines are reviewed at length with the patient today.  Concerns regarding medicines are outlined above.  No orders of the defined types were placed in this encounter.  Medication changes: No orders of the defined types were placed in this encounter.   Signed, Georgeanna Lea, MD, [facility] 10/17/2023 2:12 PM    [facility]

## 2023-10-17 NOTE — Addendum Note (Signed)
Addended by: Baldo Ash D on: 10/17/2023 02:28 PM   Modules accepted: Orders

## 2023-10-18 NOTE — Telephone Encounter (Signed)
Rx refill sent to pharmacy. 

## 2023-10-22 ENCOUNTER — Other Ambulatory Visit: Payer: Self-pay | Admitting: *Deleted

## 2023-10-22 DIAGNOSIS — Z952 Presence of prosthetic heart valve: Secondary | ICD-10-CM

## 2023-10-22 MED ORDER — WARFARIN SODIUM 5 MG PO TABS: ORAL_TABLET | ORAL | 0 refills | Status: DC

## 2023-10-22 NOTE — Telephone Encounter (Signed)
Warfarin 5mg  refill History of pulmonary embolism   S/P AVR (aortic valve replacement)  Last INR 10/02/23 Last OV 10/17/23

## 2023-11-13 DIAGNOSIS — Z6841 Body Mass Index (BMI) 40.0 and over, adult: Secondary | ICD-10-CM | POA: Diagnosis not present

## 2023-11-13 DIAGNOSIS — J329 Chronic sinusitis, unspecified: Secondary | ICD-10-CM | POA: Diagnosis not present

## 2023-11-13 DIAGNOSIS — J40 Bronchitis, not specified as acute or chronic: Secondary | ICD-10-CM | POA: Diagnosis not present

## 2023-11-20 ENCOUNTER — Ambulatory Visit: Payer: BC Managed Care – PPO | Attending: Cardiology

## 2023-11-20 DIAGNOSIS — Z5181 Encounter for therapeutic drug level monitoring: Secondary | ICD-10-CM | POA: Diagnosis not present

## 2023-11-20 DIAGNOSIS — Z952 Presence of prosthetic heart valve: Secondary | ICD-10-CM

## 2023-11-20 LAB — POCT INR: INR: 2.3 (ref 2.0–3.0)

## 2023-11-20 NOTE — Patient Instructions (Signed)
 Description   Continue taking 2 tablets daily.  Recheck INR in 4 weeks at the [place] office.  Please call the office with any medication changes or additions [phone].

## 2023-11-29 ENCOUNTER — Ambulatory Visit (INDEPENDENT_AMBULATORY_CARE_PROVIDER_SITE_OTHER): Payer: Self-pay

## 2023-11-29 DIAGNOSIS — I469 Cardiac arrest, cause unspecified: Secondary | ICD-10-CM

## 2023-11-29 LAB — CUP PACEART REMOTE DEVICE CHECK
Battery Remaining Longevity: 60 mo
Battery Remaining Percentage: 67 %
Brady Statistic RA Percent Paced: 70 %
Brady Statistic RV Percent Paced: 1 %
Date Time Interrogation Session: 20250116030200
HighPow Impedance: 92 Ohm
Implantable Lead Connection Status: 753985
Implantable Lead Connection Status: 753985
Implantable Lead Implant Date: 20151228
Implantable Lead Implant Date: 20151228
Implantable Lead Location: 753859
Implantable Lead Location: 753860
Implantable Lead Model: 292
Implantable Lead Model: 5076
Implantable Lead Serial Number: 358765
Implantable Pulse Generator Implant Date: 20151228
Lead Channel Impedance Value: 470 Ohm
Lead Channel Impedance Value: 519 Ohm
Lead Channel Pacing Threshold Amplitude: 0.6 V
Lead Channel Pacing Threshold Amplitude: 0.9 V
Lead Channel Pacing Threshold Pulse Width: 0.4 ms
Lead Channel Pacing Threshold Pulse Width: 0.4 ms
Lead Channel Setting Pacing Amplitude: 2 V
Lead Channel Setting Pacing Amplitude: 2.4 V
Lead Channel Setting Pacing Pulse Width: 0.4 ms
Lead Channel Setting Sensing Sensitivity: 0.5 mV
Pulse Gen Serial Number: 508420
Zone Setting Status: 755011

## 2023-12-18 ENCOUNTER — Ambulatory Visit: Payer: BC Managed Care – PPO | Attending: Cardiology

## 2023-12-18 DIAGNOSIS — Z952 Presence of prosthetic heart valve: Secondary | ICD-10-CM | POA: Diagnosis not present

## 2023-12-18 DIAGNOSIS — Z5181 Encounter for therapeutic drug level monitoring: Secondary | ICD-10-CM | POA: Diagnosis not present

## 2023-12-18 LAB — POCT INR: INR: 2.6 (ref 2.0–3.0)

## 2023-12-18 NOTE — Patient Instructions (Signed)
Description   Continue taking 2 tablets daily.  Recheck INR in 6 weeks at the [place] office.  Please call the office with any medication changes or additions [phone].

## 2023-12-31 ENCOUNTER — Ambulatory Visit: Payer: BC Managed Care – PPO | Attending: Cardiology

## 2023-12-31 DIAGNOSIS — Z952 Presence of prosthetic heart valve: Secondary | ICD-10-CM | POA: Diagnosis not present

## 2023-12-31 LAB — ECHOCARDIOGRAM COMPLETE
AR max vel: 1.02 cm2
AV Area VTI: 1.01 cm2
AV Area mean vel: 1.02 cm2
AV Mean grad: 18.7 mm[Hg]
AV Peak grad: 34 mm[Hg]
Ao pk vel: 2.92 m/s
S' Lateral: 3.5 cm

## 2023-12-31 MED ORDER — PERFLUTREN LIPID MICROSPHERE
1.0000 mL | INTRAVENOUS | Status: AC | PRN
  Administered 2023-12-31: 8 mL via INTRAVENOUS

## 2024-01-04 ENCOUNTER — Telehealth: Payer: Self-pay

## 2024-01-04 NOTE — Telephone Encounter (Signed)
-----   Message from Gypsy Balsam sent at 01/03/2024  9:56 AM EST ----- Echocardiogram showed preserved left ventricular ejection fraction, aortic valve prosthesis functioning properly improvement on left ventricular ejection fraction

## 2024-01-04 NOTE — Telephone Encounter (Signed)
 Patient notified of results and verbalized understanding.

## 2024-01-07 NOTE — Progress Notes (Signed)
 Remote ICD transmission.

## 2024-01-12 ENCOUNTER — Other Ambulatory Visit: Payer: Self-pay | Admitting: Cardiology

## 2024-01-12 DIAGNOSIS — Z952 Presence of prosthetic heart valve: Secondary | ICD-10-CM

## 2024-01-29 ENCOUNTER — Ambulatory Visit: Payer: BC Managed Care – PPO | Attending: Cardiology

## 2024-01-29 DIAGNOSIS — Z5181 Encounter for therapeutic drug level monitoring: Secondary | ICD-10-CM | POA: Diagnosis not present

## 2024-01-29 DIAGNOSIS — Z952 Presence of prosthetic heart valve: Secondary | ICD-10-CM

## 2024-01-29 LAB — POCT INR: INR: 3.6 — AB (ref 2.0–3.0)

## 2024-01-29 NOTE — Patient Instructions (Signed)
 Description   Only take 1 tablet today and then continue taking 2 tablets daily.  Recheck INR in 5 weeks at the [place] office.  Please call the office with any medication changes or additions [phone].

## 2024-02-28 ENCOUNTER — Ambulatory Visit (INDEPENDENT_AMBULATORY_CARE_PROVIDER_SITE_OTHER): Payer: Self-pay

## 2024-02-28 DIAGNOSIS — I472 Ventricular tachycardia, unspecified: Secondary | ICD-10-CM | POA: Diagnosis not present

## 2024-03-01 ENCOUNTER — Other Ambulatory Visit: Payer: Self-pay | Admitting: Cardiology

## 2024-03-01 LAB — CUP PACEART REMOTE DEVICE CHECK
Battery Remaining Longevity: 54 mo
Battery Remaining Percentage: 65 %
Brady Statistic RA Percent Paced: 68 %
Brady Statistic RV Percent Paced: 2 %
Date Time Interrogation Session: 20250417060300
HighPow Impedance: 82 Ohm
Implantable Lead Connection Status: 753985
Implantable Lead Connection Status: 753985
Implantable Lead Implant Date: 20151228
Implantable Lead Implant Date: 20151228
Implantable Lead Location: 753859
Implantable Lead Location: 753860
Implantable Lead Model: 292
Implantable Lead Model: 5076
Implantable Lead Serial Number: 358765
Implantable Pulse Generator Implant Date: 20151228
Lead Channel Impedance Value: 423 Ohm
Lead Channel Impedance Value: 514 Ohm
Lead Channel Pacing Threshold Amplitude: 0.6 V
Lead Channel Pacing Threshold Amplitude: 0.9 V
Lead Channel Pacing Threshold Pulse Width: 0.4 ms
Lead Channel Pacing Threshold Pulse Width: 0.4 ms
Lead Channel Setting Pacing Amplitude: 2 V
Lead Channel Setting Pacing Amplitude: 2.4 V
Lead Channel Setting Pacing Pulse Width: 0.4 ms
Lead Channel Setting Sensing Sensitivity: 0.5 mV
Pulse Gen Serial Number: 508420
Zone Setting Status: 755011

## 2024-03-04 ENCOUNTER — Ambulatory Visit: Attending: Cardiology

## 2024-03-04 ENCOUNTER — Other Ambulatory Visit: Payer: Self-pay

## 2024-03-04 DIAGNOSIS — Z952 Presence of prosthetic heart valve: Secondary | ICD-10-CM | POA: Diagnosis not present

## 2024-03-04 DIAGNOSIS — Z5181 Encounter for therapeutic drug level monitoring: Secondary | ICD-10-CM

## 2024-03-04 LAB — POCT INR: INR: 2.3 (ref 2.0–3.0)

## 2024-03-04 MED ORDER — METOPROLOL SUCCINATE ER 100 MG PO TB24: 100.0000 mg | ORAL_TABLET | Freq: Two times a day (BID) | ORAL | 1 refills | Status: DC

## 2024-03-04 NOTE — Patient Instructions (Signed)
Description   Continue taking 2 tablets daily.  Recheck INR in 6 weeks at the [place] office.  Please call the office with any medication changes or additions [phone].

## 2024-03-21 DIAGNOSIS — J40 Bronchitis, not specified as acute or chronic: Secondary | ICD-10-CM | POA: Diagnosis not present

## 2024-03-21 DIAGNOSIS — J329 Chronic sinusitis, unspecified: Secondary | ICD-10-CM | POA: Diagnosis not present

## 2024-03-21 DIAGNOSIS — Z6841 Body Mass Index (BMI) 40.0 and over, adult: Secondary | ICD-10-CM | POA: Diagnosis not present

## 2024-04-09 NOTE — Addendum Note (Signed)
 Addended by: Lott Rouleau A on: 04/09/2024 10:30 AM   Modules accepted: Orders

## 2024-04-09 NOTE — Progress Notes (Signed)
 Remote ICD transmission.

## 2024-04-10 ENCOUNTER — Other Ambulatory Visit: Payer: Self-pay | Admitting: Cardiology

## 2024-04-10 DIAGNOSIS — Z952 Presence of prosthetic heart valve: Secondary | ICD-10-CM

## 2024-04-15 ENCOUNTER — Ambulatory Visit: Attending: Cardiology

## 2024-04-15 DIAGNOSIS — Z952 Presence of prosthetic heart valve: Secondary | ICD-10-CM

## 2024-04-15 DIAGNOSIS — Z5181 Encounter for therapeutic drug level monitoring: Secondary | ICD-10-CM | POA: Diagnosis not present

## 2024-04-15 LAB — POCT INR: INR: 4 — AB (ref 2.0–3.0)

## 2024-04-15 NOTE — Patient Instructions (Signed)
 Description   HOLD today's dose and then continue taking 2 tablets daily.  Recheck INR in 5 weeks at the [place] office.  Please call the office with any medication changes or additions [phone].

## 2024-05-20 ENCOUNTER — Ambulatory Visit: Attending: Cardiology

## 2024-05-20 DIAGNOSIS — Z952 Presence of prosthetic heart valve: Secondary | ICD-10-CM

## 2024-05-20 DIAGNOSIS — Z5181 Encounter for therapeutic drug level monitoring: Secondary | ICD-10-CM | POA: Diagnosis not present

## 2024-05-20 LAB — POCT INR: INR: 2 (ref 2.0–3.0)

## 2024-05-20 NOTE — Progress Notes (Signed)
Please see anticoagulation encounter.

## 2024-05-20 NOTE — Patient Instructions (Signed)
 Description   Take 2.5 tablets today and then continue taking 2 tablets daily.  Recheck INR in 6 weeks at the [place] office.  Please call the office with any medication changes or additions [phone].

## 2024-05-29 ENCOUNTER — Ambulatory Visit: Payer: Self-pay

## 2024-05-29 DIAGNOSIS — I472 Ventricular tachycardia, unspecified: Secondary | ICD-10-CM | POA: Diagnosis not present

## 2024-05-30 LAB — CUP PACEART REMOTE DEVICE CHECK
Battery Remaining Longevity: 54 mo
Battery Remaining Percentage: 60 %
Brady Statistic RA Percent Paced: 68 %
Brady Statistic RV Percent Paced: 2 %
Date Time Interrogation Session: 20250717061900
HighPow Impedance: 82 Ohm
Implantable Lead Connection Status: 753985
Implantable Lead Connection Status: 753985
Implantable Lead Implant Date: 20151228
Implantable Lead Implant Date: 20151228
Implantable Lead Location: 753859
Implantable Lead Location: 753860
Implantable Lead Model: 292
Implantable Lead Model: 5076
Implantable Lead Serial Number: 358765
Implantable Pulse Generator Implant Date: 20151228
Lead Channel Impedance Value: 480 Ohm
Lead Channel Impedance Value: 624 Ohm
Lead Channel Pacing Threshold Amplitude: 0.6 V
Lead Channel Pacing Threshold Amplitude: 0.9 V
Lead Channel Pacing Threshold Pulse Width: 0.4 ms
Lead Channel Pacing Threshold Pulse Width: 0.4 ms
Lead Channel Setting Pacing Amplitude: 2 V
Lead Channel Setting Pacing Amplitude: 2.4 V
Lead Channel Setting Pacing Pulse Width: 0.4 ms
Lead Channel Setting Sensing Sensitivity: 0.5 mV
Pulse Gen Serial Number: 508420
Zone Setting Status: 755011

## 2024-06-14 ENCOUNTER — Ambulatory Visit: Payer: Self-pay | Admitting: Cardiology

## 2024-06-26 ENCOUNTER — Ambulatory Visit: Attending: Cardiology | Admitting: Cardiology

## 2024-06-26 ENCOUNTER — Encounter: Payer: Self-pay | Admitting: Cardiology

## 2024-06-26 VITALS — BP 110/78 | HR 75 | Ht 61.0 in | Wt 333.6 lb

## 2024-06-26 DIAGNOSIS — Z95810 Presence of automatic (implantable) cardiac defibrillator: Secondary | ICD-10-CM

## 2024-06-26 DIAGNOSIS — Z952 Presence of prosthetic heart valve: Secondary | ICD-10-CM | POA: Diagnosis not present

## 2024-06-26 DIAGNOSIS — G4733 Obstructive sleep apnea (adult) (pediatric): Secondary | ICD-10-CM | POA: Diagnosis not present

## 2024-06-26 NOTE — Patient Instructions (Addendum)
 Medication Instructions:  Your physician recommends that you continue on your current medications as directed. Please refer to the Current Medication list given to you today.  *If you need a refill on your cardiac medications before your next appointment, please call your pharmacy*   Lab Work: None Ordered If you have labs (blood work) drawn today and your tests are completely normal, you will receive your results only by: MyChart Message (if you have MyChart) OR A paper copy in the mail If you have any lab test that is abnormal or we need to change your treatment, we will call you to review the results.   Testing/Procedures: Non-Cardiac CT Angiography (CTA), is a special type of CT scan that uses a computer to produce multi-dimensional views of major blood vessels throughout the body. In CT angiography, a contrast material is injected through an IV to help visualize the blood vessels   [facility] [address] [place], [place] [address] [phone]   FEB Your physician has requested that you have an echocardiogram. Echocardiography is a painless test that uses sound waves to create images of your heart. It provides your doctor with information about the size and shape of your heart and how well your heart's chambers and valves are working. This procedure takes approximately one hour. There are no restrictions for this procedure. Please do NOT wear cologne, perfume, aftershave, or lotions (deodorant is allowed). Please arrive 15 minutes prior to your appointment time.  Please note: We ask at that you not bring children with you during ultrasound (echo/ vascular) testing. Due to room size and safety concerns, children are not allowed in the ultrasound rooms during exams. Our front office staff cannot provide observation of children in our lobby area while testing is being conducted. An adult accompanying a patient to their appointment will only be allowed in the ultrasound room at  the discretion of the ultrasound technician under special circumstances. We apologize for any inconvenience.    Follow-Up: At [facility], you and your health needs are our priority.  As part of our continuing mission to provide you with exceptional heart care, we have created designated Provider Care Teams.  These Care Teams include your primary Cardiologist (physician) and Advanced Practice Providers (APPs -  Physician Assistants and Nurse Practitioners) who all work together to provide you with the care you need, when you need it.  We recommend signing up for the patient portal called MyChart.  Sign up information is provided on this After Visit Summary.  MyChart is used to connect with patients for Virtual Visits (Telemedicine).  Patients are able to view lab/test results, encounter notes, upcoming appointments, etc.  Non-urgent messages can be sent to your provider as well.   To learn more about what you can do with MyChart, go to [email or web address].    Your next appointment:   6 month(s)- Feb  The format for your next appointment:   In Person  Provider:   Lamar Fitch, MD    Other Instructions NA

## 2024-06-26 NOTE — Progress Notes (Unsigned)
 Cardiology Office Note:    Date:  06/26/2024   ID:  Heather Mosley, DOB 02/17/1966, MRN [id number]  PCP:  Fernand Tracey LABOR, MD  Cardiologist:  Lamar Fitch, MD    Referring MD: Fernand Tracey LABOR, MD   No chief complaint on file.   History of Present Illness:    Heather Mosley is a 58 y.o. female    with very complex past medical history. Many years ago she did have aortic root replacement with bioprosthetic arctic valve that eventually failed after that she got TAVR implanted that fell fairly quickly after that and finally she ended up with scheduled 21 mm valve done in July 24, 2017 at [facility]. Her course was complicated by pulmonary emboli, cardiac arrest, she does have an ICD. Additional problems include dyslipidemia, morbidly obese.  Comes today to my office for follow-up overall she is doing fine.  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 Overview:  Overview:  BSX DC ICD implanted 11/09/14 (Followed by [facility]) Formatting of this note might be different from the original. Overview:  BSX DC ICD implanted 11/09/14 (Followed by [facility])    History of cardiac arrest 06/06/2017   History of pulmonary embolism 06/06/2017   Hypertension    Hypoxemia 07/24/2017   ICD (implantable cardioverter-defibrillator) in place 05/24/2016   Lactate blood increased 07/24/2017   Nonrheumatic aortic valve insufficiency 09/18/2014   Obesity 06/02/2015   OSA on CPAP    Pulmonary embolism (HCC) 2015   Right ventricular systolic dysfunction 07/24/2017   S/P AVR (aortic valve replacement) 07/27/2017   St Jude Valve 21 mm - 07/24/2017 [facility]   S/P ICD (internal cardiac defibrillator) procedure 11/09/2014   Overview:  BSX DC ICD implanted 11/09/14 (Followed by [facility])  Formatting of this note might be different from the original. 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 She initially underwent aortic valve commissurotomy in 1985 with subsequent aortic root replacement with a porcine valve and ascending aorta and hemiarch replacement in 2006. She subsequently developed severe symptomatic prosthetic aortic valve regurgitation and underwent successful valve and valve transcatheter aortic valve replacement with a   Unspecified dyspareunia (CODE) 11/23/2020    Past Surgical History:  Procedure Laterality Date   ANOMALOUS PULMONARY VENOUS RETURN REPAIR, TOTAL  2015   AORTIC VALVE REPAIR  1985   AORTIC VALVE REPLACEMENT  2006   aortic valve/aortic root replacement  with porcine valve, and extending aorta and hemi-arch replacement /notes 06/07/2017   AORTIC VALVE REPLACEMENT     CARDIAC CATHETERIZATION  1985; 2006; 2015   prior to my ORs those years   CARDIAC DEFIBRILLATOR PLACEMENT  2015   CARDIAC VALVE REPLACEMENT     CESAREAN SECTION  2008   LEFT HEART CATH AND CORONARY  ANGIOGRAPHY N/A 06/08/2017   Procedure: Left Heart Cath and Coronary Angiography;  Surgeon: Claudene Victory ORN, MD;  Location: [facility] INVASIVE CV LAB;  Service: Cardiovascular;  Laterality: N/A;    Current Medications: Current Meds  Medication Sig   acetaminophen  (TYLENOL ) 325 MG tablet Take 650 mg by mouth 2 (two) times daily as needed for moderate pain or headache.    aspirin  EC 81 MG tablet Take 81 mg by mouth daily.   atorvastatin  (LIPITOR) 80 MG tablet Take 1 tablet by mouth once daily.   FUROSEMIDE  PO Take 20 mg by mouth as needed (fluid retention).   Glucosamine 500 MG CAPS Take 2 capsules by mouth daily.   metoprolol  succinate (TOPROL -XL) 100 MG 24 hr tablet Take 1 tablet (100 mg total) by mouth in the morning and at bedtime. Take with or immediately following a meal.   mexiletine (MEXITIL) 150 MG capsule Take 2 capsules (300 mg total) by mouth 2 (two) times daily.   nitroGLYCERIN  (NITROSTAT ) 0.4 MG SL tablet PLACE 1 TABLET UNDER THE TONGUE EVERY 5 MINUTES FOR 3 DOSES AS NEEDED FOR CHEST PAIN   potassium chloride  (KLOR-CON ) 10 MEQ tablet Take 1 tablet (10 mEq total) by mouth as needed.   warfarin (COUMADIN ) 5 MG tablet Take 2 tablets by mouth once daily or as directed by coumadin  clinic     Allergies:   Tape   Social History   Socioeconomic History   Marital status: Married    Spouse name: Not on file   Number of children: Not on file   Years of education: Not on file   Highest education level: Not on file  Occupational History   Not on file  Tobacco Use   Smoking status: Never   Smokeless tobacco: Never  Vaping Use   Vaping status: Never Used  Substance and Sexual Activity   Alcohol use: No   Drug use: No   Sexual activity: Yes  Other Topics Concern   Not on file  Social History Narrative   Not on file   Social Drivers of Health   Financial Resource Strain: Not on file  Food Insecurity: Not on file  Transportation Needs: Not on file  Physical Activity: Not on file   Stress: Not on file  Social Connections: Not on file     Family History: The patient's family history includes Hyperlipidemia in her father; Hypertension in her mother; Leukemia in her father. ROS:   Please see the history of present illness.    All 14 point review of systems negative except as described per history of present illness  EKGs/Labs/Other Studies Reviewed:         Recent Labs: No results found for requested labs within last 365 days.  Recent Lipid Panel    Component Value Date/Time   CHOL 165 09/13/2021 1004   TRIG 219 (H) 09/13/2021 1004   HDL 49 09/13/2021 1004   CHOLHDL 3.4 09/13/2021 1004   LDLCALC 80 09/13/2021 1004    Physical Exam:    VS:  BP 110/78   Pulse 75   Ht 5' 1 (1.549 m)   Wt (!) 333  lb 9.6 oz (151.3 kg)   SpO2 96%   BMI 63.03 kg/m     Wt Readings from Last 3 Encounters:  06/26/24 (!) 333 lb 9.6 oz (151.3 kg)  10/17/23 (!) 344 lb (156 kg)  08/27/23 (!) 337 lb 3.2 oz (153 kg)     GEN:  Well nourished, well developed in no acute distress HEENT: Normal NECK: No JVD; No carotid bruits LYMPHATICS: No lymphadenopathy CARDIAC: RRR, no murmurs, no rubs, no gallops crisp mechanical valve sounds RESPIRATORY:  Clear to auscultation without rales, wheezing or rhonchi  ABDOMEN: Soft, non-tender, non-distended MUSCULOSKELETAL:  No edema; No deformity  SKIN: Warm and dry LOWER EXTREMITIES: no swelling NEUROLOGIC:  Alert and oriented x 3 PSYCHIATRIC:  Normal affect   ASSESSMENT:    1. S/P AVR (aortic valve replacement)   2. S/P ICD (internal cardiac defibrillator) procedure   3. S/P TAVR (transcatheter aortic valve replacement)   4. OSA on CPAP    PLAN:    In order of problems listed above:  Status post aortic valve replacement I did review echocardiogram from February normal function continue present management. ICD present, I did review interrogation of the device normal function no recent therapy delivered. Obstructive sleep  apnea uses CPAP mask. Morbid obesity still a problem she lost few pounds and she is very proud of herself she should be. Status post repair of the aorta.  Will do CT of her chest make sure the repair is stable.   Medication Adjustments/Labs and Tests Ordered: Current medicines are reviewed at length with the patient today.  Concerns regarding medicines are outlined above.  No orders of the defined types were placed in this encounter.  Medication changes: No orders of the defined types were placed in this encounter.   Signed, Lamar DOROTHA Fitch, MD, [facility] 06/26/2024 8:22 AM    [facility]

## 2024-07-01 ENCOUNTER — Ambulatory Visit: Attending: Cardiology

## 2024-07-01 DIAGNOSIS — Z952 Presence of prosthetic heart valve: Secondary | ICD-10-CM | POA: Diagnosis not present

## 2024-07-01 DIAGNOSIS — Z5181 Encounter for therapeutic drug level monitoring: Secondary | ICD-10-CM | POA: Diagnosis not present

## 2024-07-01 LAB — POCT INR: INR: 2.5 (ref 2.0–3.0)

## 2024-07-01 NOTE — Progress Notes (Signed)
 INR 2.5. Please see anticoagulation encounter

## 2024-07-01 NOTE — Patient Instructions (Signed)
Description   Continue taking 2 tablets daily.  Recheck INR in 6 weeks at the [place] office.  Please call the office with any medication changes or additions [phone].

## 2024-07-09 ENCOUNTER — Other Ambulatory Visit: Payer: Self-pay | Admitting: Cardiology

## 2024-07-09 DIAGNOSIS — Z952 Presence of prosthetic heart valve: Secondary | ICD-10-CM

## 2024-07-23 ENCOUNTER — Ambulatory Visit (HOSPITAL_BASED_OUTPATIENT_CLINIC_OR_DEPARTMENT_OTHER)
Admission: RE | Admit: 2024-07-23 | Discharge: 2024-07-23 | Disposition: A | Discharge status: Home / Self Care | Source: Ambulatory Visit | Attending: Cardiology | Admitting: Cardiology

## 2024-07-23 DIAGNOSIS — J9811 Atelectasis: Secondary | ICD-10-CM | POA: Diagnosis not present

## 2024-07-23 DIAGNOSIS — I517 Cardiomegaly: Secondary | ICD-10-CM | POA: Diagnosis not present

## 2024-07-23 DIAGNOSIS — Z952 Presence of prosthetic heart valve: Secondary | ICD-10-CM

## 2024-07-23 MED ORDER — IOHEXOL 350 MG/ML SOLN
100.0000 mL | Freq: Once | INTRAVENOUS | Status: AC | PRN
  Administered 2024-07-23: 80 mL via INTRAVENOUS

## 2024-07-25 ENCOUNTER — Ambulatory Visit: Payer: Self-pay | Admitting: Cardiology

## 2024-08-12 ENCOUNTER — Ambulatory Visit: Attending: Cardiology

## 2024-08-12 DIAGNOSIS — Z5181 Encounter for therapeutic drug level monitoring: Secondary | ICD-10-CM

## 2024-08-12 DIAGNOSIS — Z952 Presence of prosthetic heart valve: Secondary | ICD-10-CM

## 2024-08-12 DIAGNOSIS — Z86711 Personal history of pulmonary embolism: Secondary | ICD-10-CM

## 2024-08-12 LAB — POCT INR: INR: 2.6 (ref 2.0–3.0)

## 2024-08-12 NOTE — Patient Instructions (Signed)
 Continue taking 2 tablets daily.  Recheck INR in 6 weeks at the [place] office.  Please call the office with any medication changes or additions [phone]

## 2024-08-19 NOTE — Progress Notes (Signed)
 Remote ICD Transmission

## 2024-08-27 ENCOUNTER — Other Ambulatory Visit: Payer: Self-pay | Admitting: Cardiology

## 2024-08-28 ENCOUNTER — Ambulatory Visit: Payer: Self-pay

## 2024-08-28 DIAGNOSIS — Z952 Presence of prosthetic heart valve: Secondary | ICD-10-CM

## 2024-08-29 LAB — CUP PACEART REMOTE DEVICE CHECK
Battery Remaining Longevity: 48 mo
Battery Remaining Percentage: 56 %
Brady Statistic RA Percent Paced: 68 %
Brady Statistic RV Percent Paced: 2 %
Date Time Interrogation Session: 20251016061000
HighPow Impedance: 76 Ohm
Implantable Lead Connection Status: 753985
Implantable Lead Connection Status: 753985
Implantable Lead Implant Date: 20151228
Implantable Lead Implant Date: 20151228
Implantable Lead Location: 753859
Implantable Lead Location: 753860
Implantable Lead Model: 292
Implantable Lead Model: 5076
Implantable Lead Serial Number: 358765
Implantable Pulse Generator Implant Date: 20151228
Lead Channel Impedance Value: 452 Ohm
Lead Channel Impedance Value: 541 Ohm
Lead Channel Pacing Threshold Amplitude: 0.6 V
Lead Channel Pacing Threshold Amplitude: 0.9 V
Lead Channel Pacing Threshold Pulse Width: 0.4 ms
Lead Channel Pacing Threshold Pulse Width: 0.4 ms
Lead Channel Setting Pacing Amplitude: 2 V
Lead Channel Setting Pacing Amplitude: 2.4 V
Lead Channel Setting Pacing Pulse Width: 0.4 ms
Lead Channel Setting Sensing Sensitivity: 0.5 mV
Pulse Gen Serial Number: 508420
Zone Setting Status: 755011

## 2024-09-03 NOTE — Progress Notes (Signed)
 Remote ICD Transmission

## 2024-09-05 ENCOUNTER — Ambulatory Visit: Payer: Self-pay | Admitting: Cardiology

## 2024-09-09 ENCOUNTER — Telehealth: Payer: Self-pay | Admitting: Cardiology

## 2024-09-09 NOTE — Telephone Encounter (Signed)
 Patient says she dropped off paperwork for a handicap placard a few weeks ago and she is requesting updates. Please advise.

## 2024-09-09 NOTE — Telephone Encounter (Signed)
 Spoke with pt, advised paperwork is up front ready to be picked  up

## 2024-09-13 NOTE — Progress Notes (Unsigned)
  Electrophysiology Office Note:   Date:  09/15/2024  ID:  ELLENA KAMEN, DOB 08-Apr-1966, MRN [id number]  Primary Cardiologist: Lamar Fitch, MD Primary Heart Failure: None Electrophysiologist: Arielle Eber Gladis Norton, MD      History of Present Illness:   Heather Mosley is a 58 y.o. female with h/o aortic root/valve replacement with porcine valve and extended aortic arch and hemiarch replacement in 2006, obesity, hypertension, TAVR in 2015, PEA arrest post ICD seen today for routine electrophysiology followup.   Since last being seen in our clinic the patient reports doing overall well.  She has no chest pain or shortness of breath.  She is able to do her daily activities.  She had previously noted palpitations years ago, but this has stopped.  She is happy with how she has been feeling..  she denies chest pain, palpitations, dyspnea, PND, orthopnea, nausea, vomiting, dizziness, syncope, edema, weight gain, or early satiety.   Review of systems complete and found to be negative unless listed in HPI.      EP Information / Studies Reviewed:    EKG is ordered today. Personal review as below.  EKG Interpretation Date/Time:  Monday September 15 2024 08:40:57 EST Ventricular Rate:  75 PR Interval:  230 QRS Duration:  90 QT Interval:  406 QTC Calculation: 453 R Axis:   92  Text Interpretation: Atrial-paced rhythm with prolonged AV conduction Rightward axis When compared with ECG of 27-Aug-2023 12:09, No significant change since last tracing Confirmed by Raneshia Derick ([id number]) on 09/15/2024 8:50:29 AM   ICD Interrogation-  reviewed in detail today,  See PACEART report.  Device History: [employer] ICD implanted 2015 for ventricular tachycardia History of appropriate therapy: No History of AAD therapy: Yes; currently on mexiletine   Risk Assessment/Calculations:            Physical Exam:   VS:  BP 120/86 (BP Location: Right Arm, Patient Position: Sitting, Cuff  Size: Large)   Pulse 75   Ht 5' 1.25 (1.556 m)   Wt (!) 326 lb (147.9 kg)   SpO2 97%   BMI 61.10 kg/m    Wt Readings from Last 3 Encounters:  09/15/24 (!) 326 lb (147.9 kg)  06/26/24 (!) 333 lb 9.6 oz (151.3 kg)  10/17/23 (!) 344 lb (156 kg)     GEN: Well nourished, well developed in no acute distress NECK: No JVD; No carotid bruits CARDIAC: Regular rate and rhythm, no murmurs, rubs, gallops RESPIRATORY:  Clear to auscultation without rales, wheezing or rhonchi  ABDOMEN: Soft, non-tender, non-distended EXTREMITIES:  No edema; No deformity   ASSESSMENT AND PLAN:    Ventricular tachycardia s/p [employer] dual chamber ICD  euvolemic today Stable on an appropriate medical regimen Normal ICD function See Pace Art report Has a Gore lead shock impedance has remained stable.  Kennieth Plotts continue monitoring. No changes today  2.  Aortic root/valve replacement: Plan per primary cardiology  3.  Hypertension: Well-controlled  4.  Obesity: Lifestyle modification encouraged  5.  Obstructive sleep apnea: CPAP compliance encouraged  6.  PACs: Continue metoprolol   Disposition:   Follow up with EP Team in 12 months   Signed, Chelsie Burel Gladis Norton, MD

## 2024-09-15 ENCOUNTER — Encounter: Payer: Self-pay | Admitting: Cardiology

## 2024-09-15 ENCOUNTER — Ambulatory Visit: Payer: Self-pay | Admitting: Cardiology

## 2024-09-15 ENCOUNTER — Ambulatory Visit: Attending: Cardiology | Admitting: Cardiology

## 2024-09-15 VITALS — BP 120/86 | HR 75 | Ht 61.25 in | Wt 326.0 lb

## 2024-09-15 DIAGNOSIS — Z952 Presence of prosthetic heart valve: Secondary | ICD-10-CM | POA: Diagnosis not present

## 2024-09-15 DIAGNOSIS — I472 Ventricular tachycardia, unspecified: Secondary | ICD-10-CM | POA: Diagnosis not present

## 2024-09-15 DIAGNOSIS — Z95810 Presence of automatic (implantable) cardiac defibrillator: Secondary | ICD-10-CM | POA: Diagnosis not present

## 2024-09-15 DIAGNOSIS — I10 Essential (primary) hypertension: Secondary | ICD-10-CM

## 2024-09-15 DIAGNOSIS — I469 Cardiac arrest, cause unspecified: Secondary | ICD-10-CM

## 2024-09-15 LAB — CUP PACEART INCLINIC DEVICE CHECK
Date Time Interrogation Session: 20251103092853
HighPow Impedance: 87 Ohm
Implantable Lead Connection Status: 753985
Implantable Lead Connection Status: 753985
Implantable Lead Implant Date: 20151228
Implantable Lead Implant Date: 20151228
Implantable Lead Location: 753859
Implantable Lead Location: 753860
Implantable Lead Model: 292
Implantable Lead Model: 5076
Implantable Lead Serial Number: 358765
Implantable Pulse Generator Implant Date: 20151228
Lead Channel Impedance Value: 467 Ohm
Lead Channel Impedance Value: 588 Ohm
Lead Channel Pacing Threshold Amplitude: 0.6 V
Lead Channel Pacing Threshold Amplitude: 0.9 V
Lead Channel Pacing Threshold Pulse Width: 0.4 ms
Lead Channel Pacing Threshold Pulse Width: 0.4 ms
Lead Channel Sensing Intrinsic Amplitude: 10.3 mV
Lead Channel Sensing Intrinsic Amplitude: 3.4 mV
Lead Channel Setting Pacing Amplitude: 2 V
Lead Channel Setting Pacing Amplitude: 2.4 V
Lead Channel Setting Pacing Pulse Width: 0.4 ms
Lead Channel Setting Sensing Sensitivity: 0.5 mV
Pulse Gen Serial Number: 508420
Zone Setting Status: 755011

## 2024-09-23 ENCOUNTER — Ambulatory Visit: Attending: Cardiology

## 2024-09-23 DIAGNOSIS — Z952 Presence of prosthetic heart valve: Secondary | ICD-10-CM | POA: Diagnosis not present

## 2024-09-23 DIAGNOSIS — Z86711 Personal history of pulmonary embolism: Secondary | ICD-10-CM

## 2024-09-23 DIAGNOSIS — Z5181 Encounter for therapeutic drug level monitoring: Secondary | ICD-10-CM | POA: Diagnosis not present

## 2024-09-23 LAB — POCT INR: INR: 2.7 (ref 2.0–3.0)

## 2024-09-23 NOTE — Patient Instructions (Signed)
 Continue taking 2 tablets daily.  Recheck INR in 6 weeks at the [place] office.  Please call the office with any medication changes or additions [phone]

## 2024-10-01 ENCOUNTER — Other Ambulatory Visit: Payer: Self-pay | Admitting: Cardiology

## 2024-10-15 ENCOUNTER — Other Ambulatory Visit: Payer: Self-pay | Admitting: Cardiology

## 2024-10-20 ENCOUNTER — Telehealth: Payer: Self-pay

## 2024-10-20 ENCOUNTER — Encounter (HOSPITAL_COMMUNITY): Payer: Self-pay | Admitting: *Deleted

## 2024-10-20 ENCOUNTER — Emergency Department (HOSPITAL_COMMUNITY)

## 2024-10-20 ENCOUNTER — Other Ambulatory Visit: Payer: Self-pay

## 2024-10-20 ENCOUNTER — Other Ambulatory Visit: Payer: Self-pay | Admitting: Pulmonary Disease

## 2024-10-20 ENCOUNTER — Emergency Department (HOSPITAL_COMMUNITY)
Admission: EM | Admit: 2024-10-20 | Discharge: 2024-10-20 | Disposition: A | Discharge status: Home / Self Care | Attending: Emergency Medicine | Admitting: Emergency Medicine

## 2024-10-20 DIAGNOSIS — I483 Typical atrial flutter: Secondary | ICD-10-CM

## 2024-10-20 DIAGNOSIS — D6869 Other thrombophilia: Secondary | ICD-10-CM

## 2024-10-20 DIAGNOSIS — I4892 Unspecified atrial flutter: Secondary | ICD-10-CM

## 2024-10-20 DIAGNOSIS — Z7982 Long term (current) use of aspirin: Secondary | ICD-10-CM | POA: Diagnosis not present

## 2024-10-20 DIAGNOSIS — Z95810 Presence of automatic (implantable) cardiac defibrillator: Secondary | ICD-10-CM | POA: Diagnosis not present

## 2024-10-20 DIAGNOSIS — I5023 Acute on chronic systolic (congestive) heart failure: Secondary | ICD-10-CM | POA: Diagnosis not present

## 2024-10-20 DIAGNOSIS — I517 Cardiomegaly: Secondary | ICD-10-CM | POA: Diagnosis not present

## 2024-10-20 DIAGNOSIS — R002 Palpitations: Secondary | ICD-10-CM | POA: Diagnosis not present

## 2024-10-20 LAB — BASIC METABOLIC PANEL WITH GFR
Anion gap: 11 (ref 5–15)
BUN: 11 mg/dL (ref 6–20)
CO2: 23 mmol/L (ref 22–32)
Calcium: 9 mg/dL (ref 8.9–10.3)
Chloride: 105 mmol/L (ref 98–111)
Creatinine, Ser: 0.74 mg/dL (ref 0.44–1.00)
GFR, Estimated: >60 mL/min (ref >60)
Glucose, Bld: 216 mg/dL — ABNORMAL HIGH (ref 70–99)
Potassium: 3.9 mmol/L (ref 3.5–5.1)
Sodium: 139 mmol/L (ref 135–145)

## 2024-10-20 LAB — CBC
HCT: 39.7 % (ref 36.0–46.0)
Hemoglobin: 12.7 g/dL (ref 12.0–15.0)
MCH: 27.5 pg (ref 26.0–34.0)
MCHC: 32 g/dL (ref 30.0–36.0)
MCV: 86.1 fL (ref 80.0–100.0)
Platelets: 147 K/uL — ABNORMAL LOW (ref 150–400)
RBC: 4.61 MIL/uL (ref 3.87–5.11)
RDW: 15.6 % — ABNORMAL HIGH (ref 11.5–15.5)
WBC: 6.5 K/uL (ref 4.0–10.5)
nRBC: 0 % (ref 0.0–0.2)

## 2024-10-20 LAB — PROTIME-INR
INR: 2.2 — ABNORMAL HIGH (ref 0.8–1.2)
Prothrombin Time: 25.1 s — ABNORMAL HIGH (ref 11.4–15.2)

## 2024-10-20 LAB — TROPONIN I (HIGH SENSITIVITY): Troponin I (High Sensitivity): 8 ng/L (ref <18)

## 2024-10-20 MED ORDER — ETOMIDATE 2 MG/ML IV SOLN
INTRAVENOUS | Status: AC | PRN
  Administered 2024-10-20: 10 mg via INTRAVENOUS

## 2024-10-20 MED ORDER — MEXILETINE HCL 150 MG PO CAPS
300.0000 mg | ORAL_CAPSULE | Freq: Two times a day (BID) | ORAL | Status: DC
  Administered 2024-10-20: 300 mg via ORAL
  Filled 2024-10-20 (×2): qty 2

## 2024-10-20 MED ORDER — DRONEDARONE HCL 400 MG PO TABS: 400.0000 mg | ORAL_TABLET | Freq: Two times a day (BID) | ORAL | 0 refills | Status: AC

## 2024-10-20 MED ORDER — ASPIRIN 81 MG PO TBEC
81.0000 mg | DELAYED_RELEASE_TABLET | Freq: Every day | ORAL | Status: DC
  Administered 2024-10-20: 81 mg via ORAL
  Filled 2024-10-20: qty 1

## 2024-10-20 MED ORDER — ETOMIDATE 2 MG/ML IV SOLN
0.1000 mg/kg | Freq: Once | INTRAVENOUS | Status: DC
  Filled 2024-10-20: qty 10

## 2024-10-20 MED ORDER — METOPROLOL SUCCINATE ER 25 MG PO TB24
100.0000 mg | ORAL_TABLET | Freq: Every day | ORAL | Status: DC
  Administered 2024-10-20: 100 mg via ORAL
  Filled 2024-10-20: qty 4

## 2024-10-20 MED ORDER — DRONEDARONE HCL 400 MG PO TABS: 400.0000 mg | ORAL_TABLET | Freq: Two times a day (BID) | ORAL | Status: DC

## 2024-10-20 MED ORDER — METOPROLOL TARTRATE 5 MG/5ML IV SOLN
2.5000 mg | Freq: Once | INTRAVENOUS | Status: AC
  Administered 2024-10-20: 2.5 mg via INTRAVENOUS
  Filled 2024-10-20: qty 5

## 2024-10-20 MED ORDER — POTASSIUM CHLORIDE CRYS ER 20 MEQ PO TBCR
20.0000 meq | EXTENDED_RELEASE_TABLET | Freq: Once | ORAL | Status: AC
  Administered 2024-10-20: 20 meq via ORAL
  Filled 2024-10-20: qty 1

## 2024-10-20 NOTE — Progress Notes (Signed)
 Pt called ER and indicated pharmacy did not have dronedarone  available today.  Will send in one month supply for [facility] to allow sufficient time for pharmacy delivery.     Daphne Barrack, NP-C, AGACNP-BC [facility] - Electrophysiology  10/20/2024, 2:53 PM

## 2024-10-20 NOTE — ED Provider Notes (Signed)
 [place] [facility] AT [facility] Provider Note   CSN: [id number] Arrival date & time: 10/20/24  9641     Patient presents with: Chief complaint-palpitations   Heather Mosley is a 58 y.o. female.   HPI Patient with extensive history including aortic valve replacement, previous TAVR, ICD, previous PEA arrest presents with palpitations She reports around 2345 last night she got on the toilet and felt her heart racing.  No syncope.  No chest pain or shortness of breath. She has an ICD in place and has not delivered any shocks  She does not recall having these symptoms previously. She does report she has had a recent upper respiratory infection as well as GI illness.  Earlier in the week she did miss one round of her meds including Coumadin . 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 Overview:  Overview:  BSX DC ICD implanted 11/09/14 (Followed by [facility]) Formatting of this note might be different from the  original. Overview:  BSX DC ICD implanted 11/09/14 (Followed by [facility])   History of cardiac arrest 06/06/2017   History of pulmonary embolism 06/06/2017   Hypertension    Hypoxemia 07/24/2017   ICD (implantable cardioverter-defibrillator) in place 05/24/2016   Lactate blood increased 07/24/2017   Nonrheumatic aortic valve insufficiency 09/18/2014   Obesity 06/02/2015   OSA on CPAP    Pulmonary embolism (HCC) 2015   Right ventricular systolic dysfunction 07/24/2017   S/P AVR (aortic valve replacement) 07/27/2017   St Jude Valve 21 mm - 07/24/2017 [facility]   S/P ICD (internal cardiac defibrillator) procedure 11/09/2014   Overview:  BSX DC ICD implanted 11/09/14 (Followed by [facility])  Formatting of this note might be different from the original. 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 She initially underwent aortic valve commissurotomy in 1985 with subsequent aortic root replacement with a porcine valve and ascending aorta and hemiarch replacement in 2006. She subsequently developed severe symptomatic prosthetic aortic valve regurgitation and underwent successful valve and valve transcatheter aortic valve replacement with a   Unspecified dyspareunia (CODE) 11/23/2020    Prior to Admission medications   Medication Sig Start Date End Date Taking? Authorizing Provider  acetaminophen  (TYLENOL ) 325 MG tablet Take 650 mg by mouth 2 (two) times daily as needed for moderate  pain or headache.     [provider]  aspirin  EC 81 MG tablet Take 81 mg by mouth daily.    [provider]  atorvastatin  (LIPITOR) 80 MG tablet Take 1 tablet (80 mg total) by mouth daily. 10/15/24   Krasowski, Robert J, MD  FUROSEMIDE   PO Take 20 mg by mouth as needed (fluid retention).    [provider]  Glucosamine 500 MG CAPS Take 2 capsules by mouth daily.    [provider]  metoprolol  succinate (TOPROL -XL) 100 MG 24 hr tablet Take 1 tablet (100 mg total) by mouth in the morning and at bedtime. Take with or immediately following a meal. 10/03/24   Camnitz, Soyla Lunger, MD  mexiletine (MEXITIL ) 150 MG capsule Take 2 capsules (300 mg total) by mouth 2 (two) times daily. 03/03/24   Camnitz, Will Lunger, MD  nitroGLYCERIN  (NITROSTAT ) 0.4 MG SL tablet PLACE 1 TABLET UNDER THE TONGUE EVERY 5 MINUTES FOR 3 DOSES AS NEEDED FOR CHEST PAIN 12/06/20   Bernie Lamar PARAS, MD  potassium chloride  (KLOR-CON ) 10 MEQ tablet Take 1 tablet (10 mEq total) by mouth as needed. 11/21/19   Krasowski, Robert J, MD  warfarin (COUMADIN ) 5 MG tablet Take 2 tablets by mouth once daily or as directed by coumadin  clinic 07/09/24   Krasowski, Robert J, MD    Allergies: Tape    Review of Systems  Constitutional:  Negative for fever.  Respiratory:  Negative for shortness of breath.   Cardiovascular:  Positive for palpitations. Negative for chest pain and leg swelling.  Neurological:  Negative for syncope.    Updated Vital Signs BP (!) 149/90   Pulse (!) 120   Temp 98 F (36.7 C) (Oral)   Resp 19   Ht 1.549 m (5' 1)   Wt (!) 152 kg   SpO2 98%   BMI 63.30 kg/m   Physical Exam CONSTITUTIONAL: Well developed/well nourished, anxious HEAD: Normocephalic/atraumatic ENMT: Mucous membranes moist NECK: supple no meningeal signs CV: Tachycardic, irregular, click noted LUNGS: Lungs are clear to auscultation bilaterally, no apparent distress ABDOMEN: soft, nontender NEURO: Pt is awake/alert/appropriate, moves all extremitiesx4.  No facial droop.   EXTREMITIES: pulses normal/equal, full ROM, no lower extremity edema SKIN: warm, color normal PSYCH: Mildly anxious (all labs ordered are listed, but only abnormal results are  displayed) Labs Reviewed  BASIC METABOLIC PANEL WITH GFR - Abnormal; Notable for the following components:      Result Value   Glucose, Bld 216 (*)    All other components within normal limits  CBC - Abnormal; Notable for the following components:   RDW 15.6 (*)    Platelets 147 (*)    All other components within normal limits  PROTIME-INR - Abnormal; Notable for the following components:   Prothrombin Time 25.1 (*)    INR 2.2 (*)    All other components within normal limits  TROPONIN I (HIGH SENSITIVITY)    EKG: EKG Interpretation Date/Time:  Monday October 20 2024 04:17:52 EST Ventricular Rate:  113 PR Interval:    QRS Duration:  86 QT Interval:  316 QTC Calculation: 433 R Axis:   86  Text Interpretation: Atrial flutter with variable A-V block ST & T wave abnormality, consider lateral ischemia Abnormal ECG Confirmed by Midge Golas ([id number]) on 10/20/2024 5:36:53 AM  Radiology: [facility] Chest 2 View Result Date: 10/20/2024 EXAM: 2 VIEW(S) XRAY OF THE CHEST 10/20/2024 04:43:00 AM COMPARISON: PA and lateral radiographs of the chest dated 06/07/2017. CLINICAL HISTORY:  irregualr heart rate FINDINGS: LUNGS AND PLEURA: Diffuse interstitial prominence. No pleural effusion. No pneumothorax. HEART AND MEDIASTINUM: Left chest pacemaker/AICD with leads overlying right atrium and right ventricle. Cardiomegaly. Status post cardiac valve repair. Median sternotomy wires noted. BONES AND SOFT TISSUES: Right axillary surgical clips noted. No acute osseous abnormality. IMPRESSION: 1. Cardiomegaly, status post cardiac valve repair, and left chest pacemaker/AICD with leads overlying right atrium and right ventricle. 2. Diffuse interstitial prominence. Electronically signed by: Evalene Coho MD 10/20/2024 04:47 AM EST RP Workstation: [id number]     .Critical Care  Performed by: Midge Golas, MD Authorized by: Midge Golas, MD   Critical care provider statement:    Critical care time  (minutes):  39   Critical care start time:  10/20/2024 5:30 AM   Critical care end time:  10/20/2024 6:09 AM   Critical care time was exclusive of:  Separately billable procedures and treating other patients   Critical care was necessary to treat or prevent imminent or life-threatening deterioration of the following conditions:  Cardiac failure and circulatory failure   Critical care was time spent personally by me on the following activities:  Examination of patient, obtaining history from patient or surrogate, ordering and review of laboratory studies, ordering and review of radiographic studies, ordering and performing treatments and interventions and review of old charts   I assumed direction of critical care for this patient from another provider in my specialty: no      Medications Ordered in the ED  metoprolol  tartrate (LOPRESSOR ) injection 2.5 mg (2.5 mg Intravenous Given 10/20/24 0613)    Clinical Course as of 10/20/24 0709  Mon Oct 20, 2024  0606 Patient with extensive and complicated cardiac history including previous PEA arrest, ICD in place, aortic valve replacement on Coumadin  as well as mexiletine presents with atrial flutter.  This appears to be new onset.  Patient did mention recently missing her medications due to a GI illness.  Will need to check INR and anticipate cardiology consultation Patient is already on metoprolol , will give her a one-time IV dose to see if this controls her heart rate [DW]  0700 INR is at 2.2. Patient reports feeling somewhat improved after metoprolol  Blood pressures improved though heart rate is still around 115-120 and still appears to be atrial flutter She has no other new complaints [DW]  0700 Patient has been endorsed to the cardiology electrophysiology team.  They plan to evaluate the patient in the emergency department.  Patient and family were updated on plan [DW]  0708 Signed out to dr charlyn at shift change to f/u on cardiolog recs [DW]     Clinical Course User Index [DW] Midge Golas, MD                                 Medical Decision Making Amount and/or Complexity of Data Reviewed Labs: ordered. Radiology: ordered.  Risk Prescription drug management.   This patient presents to the ED for concern of palpitations, this involves an extensive number of treatment options, and is a complaint that carries with it a high risk of complications and morbidity.  The differential diagnosis includes but is not limited to atrial flutter, atrial fibrillation, sinus tachycardia, SVT, ventricular tachycardia  Comorbidities that complicate the patient evaluation: Patient's presentation is complicated by their history of aortic valve replacement, ICD in place, previous PEA arrest  Additional history obtained: Additional history obtained from spouse Records reviewed cardiology  notes reviewed  Lab Tests: I Ordered, and personally interpreted labs.  The pertinent results include: Mild hyperglycemia  Imaging Studies ordered: I ordered imaging studies including X-ray chest  I independently visualized and interpreted imaging which showed cardiomegaly, ICD in place I agree with the radiologist interpretation  Cardiac Monitoring: The patient was maintained on a cardiac monitor.  I personally viewed and interpreted the cardiac monitor which showed an underlying rhythm of:  Atrial Flutter  Medicines ordered and prescription drug management: I ordered medication including lopressor    for tachycardia  Reevaluation of the patient after these medicines showed that the patient    improved  Critical Interventions:  IV metoprolol   Consultations Obtained: I requested consultation with the consultant cardiology NP Aniceto, and discussed  findings as well as pertinent plan - they recommend: will see patient in the ER  Reevaluation: After the interventions noted above, I reevaluated the patient and found that they have  :improved  Complexity of problems addressed: Patient's presentation is most consistent with  acute presentation with potential threat to life or bodily function         Final diagnoses:  Atrial flutter, unspecified type [facility])    ED Discharge Orders     None          Midge Golas, MD 10/20/24 [phone]

## 2024-10-20 NOTE — Telephone Encounter (Signed)
 Pt called because her pharmacy cannot fill the Multaq  (dronedarone ) until tomorrow. She checked with two pharmacies in the [place] area and they both had to order the med and can fill Rx tomorrow. She wanted to make sure that that is ok instead of starting medication tonight. Per, Daphne Barrack, NP it is okay to start med tomorrow.

## 2024-10-20 NOTE — ED Provider Notes (Signed)
 Physical Exam  BP (!) 150/81   Pulse 74   Temp 98 F (36.7 C) (Oral)   Resp 13   Ht 5' 1 (1.549 m)   Wt (!) 152 kg   SpO2 97%   BMI 63.30 kg/m   Physical Exam  Procedures  .Sedation  Date/Time: 10/20/2024 11:06 AM  Performed by: Charlyn Sora, MD Authorized by: Charlyn Sora, MD   Consent:    Consent obtained:  Written and verbal   Consent given by:  Patient   Risks discussed:  Allergic reaction, prolonged hypoxia resulting in organ damage, prolonged sedation necessitating reversal, inadequate sedation, dysrhythmia, respiratory compromise necessitating ventilatory assistance and intubation, vomiting and nausea   Alternatives discussed:  Analgesia without sedation Universal protocol:    Procedure explained and questions answered to patient or proxy's satisfaction: yes     Relevant documents present and verified: yes     Test results available: yes     Imaging studies available: yes     Required blood products, implants, devices, and special equipment available: yes     Site/side marked: yes     Immediately prior to procedure, a time out was called: yes     Patient identity confirmed:  Arm band Indications:    Procedure performed:  Cardioversion   Procedure necessitating sedation performed by:  Physician performing sedation Pre-sedation assessment:    Time since last food or drink:  Greater than 12 hours   ASA classification: class 3 - patient with severe systemic disease     Mouth opening:  2 finger widths   Thyromental distance:  4 finger widths   Mallampati score:  III - soft palate, base of uvula visible   Neck mobility: reduced     Pre-sedation assessments completed and reviewed: airway patency, cardiovascular function, hydration status, mental status, nausea/vomiting, pain level, respiratory function and temperature   A pre-sedation assessment was completed prior to the start of the procedure Immediate pre-procedure details:    Reassessment: Patient  reassessed immediately prior to procedure     Reviewed: vital signs, relevant labs/tests and NPO status     Verified: bag valve mask available, emergency equipment available, intubation equipment available, IV patency confirmed, oxygen available and suction available   Procedure details (see MAR for exact dosages):    Preoxygenation:  Nasal cannula   Sedation:  Etomidate    Intended level of sedation: deep   Intra-procedure monitoring:  Blood pressure monitoring, continuous capnometry, frequent LOC assessments, frequent vital sign checks, continuous pulse oximetry and cardiac monitor   Intra-procedure events: none     Total Provider sedation time (minutes):  18 Post-procedure details:   A post-sedation assessment was completed following the completion of the procedure.   Attendance: Constant attendance by certified staff until patient recovered     Recovery: Patient returned to pre-procedure baseline     Post-sedation assessments completed and reviewed: airway patency, cardiovascular function, hydration status, mental status, nausea/vomiting, pain level, respiratory function and temperature     Patient is stable for discharge or admission: yes     Procedure completion:  Tolerated well, no immediate complications .Cardioversion  Date/Time: 10/20/2024 11:08 AM  Performed by: Charlyn Sora, MD Authorized by: Charlyn Sora, MD   Consent:    Consent obtained:  Verbal and written   Consent given by:  Patient   Risks discussed:  Cutaneous burn, induced arrhythmia and pain   Alternatives discussed:  Delayed treatment and rate-control medication Universal protocol:    Procedure explained and questions answered  to patient or proxy's satisfaction: yes     Relevant documents present and verified: yes     Test results available and properly labeled: yes     Imaging studies available: yes     Required blood products, implants, devices, and special equipment available: yes     Site/side marked:  yes     Immediately prior to procedure a time out was called: yes     Patient identity confirmed:  Arm band Pre-procedure details:    Cardioversion basis:  Emergent   Rhythm:  Atrial fibrillation   Electrode placement:  Anterior-posterior Patient sedated: No Attempt one:    Cardioversion mode:  Synchronous   Waveform:  Biphasic   Shock (Joules):  150   Shock outcome:  Conversion to normal sinus rhythm Post-procedure details:    Patient status:  Awake   Patient tolerance of procedure:  Tolerated well, no immediate complications   ED Course / MDM   Clinical Course as of 10/20/24 1109  Mon Oct 20, 2024  0606 Patient with extensive and complicated cardiac history including previous PEA arrest, ICD in place, aortic valve replacement on Coumadin  as well as mexiletine presents with atrial flutter.  This appears to be new onset.  Patient did mention recently missing her medications due to a GI illness.  Will need to check INR and anticipate cardiology consultation Patient is already on metoprolol , will give her a one-time IV dose to see if this controls her heart rate [DW]  0700 INR is at 2.2. Patient reports feeling somewhat improved after metoprolol  Blood pressures improved though heart rate is still around 115-120 and still appears to be atrial flutter She has no other new complaints [DW]  0700 Patient has been endorsed to the cardiology electrophysiology team.  They plan to evaluate the patient in the emergency department.  Patient and family were updated on plan [DW]  0708 Signed out to dr charlyn at shift change to f/u on cardiolog recs [DW]  1014 Cardiology team has seen the patient.  They recommend cardioversion.  Patient has consented for the procedure.  We will likely proceed at 10:30 AM.  Nursing staff aware. [AN]    Clinical Course User Index [AN] Charlyn Sora, MD [DW] Midge Golas, MD   Medical Decision Making Amount and/or Complexity of Data Reviewed Labs:  ordered. Radiology: ordered.  Risk Prescription drug management.   Pt with new onset aflutter. She has significant cardiac dz hx and is on coumadin  for metallic valve. Has a pacemaker as well. Cardiology consulted. Iv metoprolol  ordered and pt overall well. NPO.   8:30 AM: Cardiology service is seeing the patient.  Dispo pending.   11:09 AM Patient successfully cardioverted.  I reassessed the patient.  She is now speaking.  She is able to move all 4 extremities and denies any new neurologic symptoms.  Sinus rhythm restored.    Charlyn Sora, MD 10/20/24 1109

## 2024-10-20 NOTE — ED Notes (Signed)
 Pt ambulated to bathroom with no assistance.

## 2024-10-20 NOTE — ED Notes (Signed)
 Cardiac pads placed on pt in preparation for cardioversion

## 2024-10-20 NOTE — ED Triage Notes (Signed)
 C/o elevated heart rate onset 1145 pm , states she ACID,  history of CABG with valve replacement x 3 last one 2018, denies chest pain

## 2024-10-20 NOTE — ED Notes (Signed)
 [employer] Report uploaded to the patient's chart.  A copy was also sent to medical records.

## 2024-10-20 NOTE — Discharge Instructions (Addendum)
 You were seen in the emergency room for elevated heart rate. You have a new diagnosis of atrial fibrillation or flutter, and we have prescribed new medication for it.  You were cardioverted in the emergency room and normal rhythm was restored.  Please follow-up with the cardiologist.  We have placed amatory referral to A-fib clinic, therefore typically they will contact you within 48 hours with a follow-up appointment.  Please return to the emergency room if you start having any new chest pain, palpitations, dizziness, shortness of breath or any neurologic symptoms such as one-sided weakness, numbness, slurred speech.  It is prudent that you take your Coumadin  without missing any doses to reduce the risk of stroke.

## 2024-10-20 NOTE — Consult Note (Addendum)
 ELECTROPHYSIOLOGY CONSULT NOTE    Patient ID: Heather Mosley MRN: [id number], DOB/AGE: 56/05/1966 56 y.o.  Admit date: 10/20/2024 Date of Consult: 10/20/2024  Primary Physician: Fernand Tracey LABOR, MD Primary Cardiologist: Lamar Fitch, MD  Electrophysiologist: Dr. Inocencio   Referring Provider: Dr. Midge  Patient Profile: Heather Mosley is a 58 y.o. female with a history of HFrEF, PEA arrest s/p ICD, congenital AS s/p aortic root / valve replacement with porcine valve and extended aortic arch, hemiarch replacement in 2006 > developed AS and had subsequent TAVR 2015, HTN, obesity, PE 2018, OSA on CPAP who is being seen today for the evaluation of atrial flutter at the request of Dr. Midge.  HPI:  Heather Mosley is a 58 y.o. female who presented to [facility] ER on 10/20/24 with reports of palpitations.   She reported she recently had a URI and GI illness. She missed one day of medications due to GI symptoms this week including coumadin .  She reportedly got up to go to the restroom and felt her heart racing. Initial labs > Na 139, K 3.9, Cr 0.74, WBC 6.5, Hgb 12.7, platelets 147, troponin 8.  INR 2.2. CXR with ICD in place, AVR, interstitial prominence.  She reports she feels she is largely over her recent illness.  She had largely diarrhea with GI bug.  She went shopping all day on Saturday and was fatigued from the exertion of walking.   She denies chest pain, palpitations, dyspnea, PND, orthopnea, dizziness, syncope, edema, weight gain, or early satiety.   Labs Potassium3.9 (12/08 0423)   Creatinine, ser  0.74 (12/08 0423) PLT  147* (12/08 0423) HGB  12.7 (12/08 0423) WBC 6.5 (12/08 0423) Troponin I (High Sensitivity)8 (12/08 0423).    Past Medical History:  Diagnosis Date   Acute on chronic systolic ACC/AHA stage C congestive heart failure (HCC) 11/09/2014   AICD (automatic cardioverter/defibrillator) present 2015   AKI (acute kidney injury) 07/24/2017   Aortic prosthetic valve  regurgitation 10/29/2014   Aortic valvular stenoses    born w/it   Arthritis    knees (06/07/2017)   Cardiac arrest (HCC) 11/03/2014   Chest pain 06/07/2017   Chronic pain of both knees 03/20/2017   Congenital heart defect    Encounter for therapeutic drug monitoring 09/11/2017   Exertional dyspnea 07/16/2017   Heart murmur    High cholesterol    History of biliary T-tube placement 11/09/2014   Overview:  Overview:  BSX DC ICD implanted 11/09/14 (Followed by [facility])  Formatting of this note might be different from the original. Overview:  Overview:  BSX DC ICD implanted 11/09/14 (Followed by [facility]) Formatting of this note might be different from the original. Overview:  BSX DC ICD implanted 11/09/14 (Followed by [facility])   History of cardiac arrest 06/06/2017   History of pulmonary embolism 06/06/2017   Hypertension    Hypoxemia 07/24/2017   ICD (implantable cardioverter-defibrillator) in place 05/24/2016   Lactate blood increased 07/24/2017   Nonrheumatic aortic valve insufficiency 09/18/2014   Obesity 06/02/2015   OSA on CPAP    Pulmonary embolism (HCC) 2015   Right ventricular systolic dysfunction 07/24/2017   S/P AVR (aortic valve replacement) 07/27/2017   St Jude Valve 21 mm - 07/24/2017 [facility]   S/P ICD (internal cardiac defibrillator) procedure 11/09/2014   Overview:  BSX DC ICD implanted 11/09/14 (Followed by [facility])  Formatting of this note might be different from the original. BSX DC ICD implanted 11/09/14 (Followed by [facility])  S/P TAVR (transcatheter aortic valve replacement) 09/18/2014   Severe aortic stenosis 10/28/2014   Status post combined aortic root and valve replacement using stentless bioprosthetic aortic valve 09/18/2014   Overview:  History of congenital aortic valve disease. She initially underwent aortic valve commissurotomy in 1985 with subsequent aortic root replacement with a porcine valve and ascending aorta and hemiarch replacement in  2006. She subsequently developed severe symptomatic prosthetic aortic valve regurgitation and underwent successful valve and valve transcatheter aortic valve replacement with a   Unspecified dyspareunia (CODE) 11/23/2020     Surgical History:  Past Surgical History:  Procedure Laterality Date   ANOMALOUS PULMONARY VENOUS RETURN REPAIR, TOTAL  2015   AORTIC VALVE REPAIR  1985   AORTIC VALVE REPLACEMENT  2006   aortic valve/aortic root replacement with porcine valve, and extending aorta and hemi-arch replacement /notes 06/07/2017   AORTIC VALVE REPLACEMENT     CARDIAC CATHETERIZATION  1985; 2006; 2015   prior to my ORs those years   CARDIAC DEFIBRILLATOR PLACEMENT  2015   CARDIAC VALVE REPLACEMENT     CESAREAN SECTION  2008   LEFT HEART CATH AND CORONARY ANGIOGRAPHY Heather/A 06/08/2017   Procedure: Left Heart Cath and Coronary Angiography;  Surgeon: Claudene Victory ORN, MD;  Location: [facility] INVASIVE CV LAB;  Service: Cardiovascular;  Laterality: Heather/A;     (Not in a hospital admission)   Inpatient Medications:   aspirin  EC  81 mg Oral Daily   metoprolol  succinate  100 mg Oral Daily   mexiletine  300 mg Oral BID   potassium chloride   20 mEq Oral Once    Allergies:  Allergies  Allergen Reactions   Tape Rash    Family History  Problem Relation Age of Onset   Hypertension Mother    Leukemia Father    Hyperlipidemia Father      Physical Exam: Vitals:   10/20/24 0700 10/20/24 0730 10/20/24 0733 10/20/24 0815  BP: (!) 123/96 113/67  130/71  Pulse: (!) 116 (!) 135  (!) 139  Resp: (!) 27 (!) 21  (!) 27  Temp:      TempSrc:      SpO2: 99% 97% 98% 97%  Weight:      Height:        GEN- NAD, A&O x 3, normal affect HEENT: Normocephalic, atraumatic Lungs- CTAB, Normal effort.  Heart- tachy, regular/irregular rate and rhythm, No M/G/R.  GI- Soft, NT, ND.  Extremities- No clubbing, cyanosis, or edema   Radiology/Studies: DG Chest 2 View Result Date: 10/20/2024 EXAM: 2 VIEW(S) XRAY OF  THE CHEST 10/20/2024 04:43:00 AM COMPARISON: PA and lateral radiographs of the chest dated 06/07/2017. CLINICAL HISTORY: irregualr heart rate FINDINGS: LUNGS AND PLEURA: Diffuse interstitial prominence. No pleural effusion. No pneumothorax. HEART AND MEDIASTINUM: Left chest pacemaker/AICD with leads overlying right atrium and right ventricle. Cardiomegaly. Status post cardiac valve repair. Median sternotomy wires noted. BONES AND SOFT TISSUES: Right axillary surgical clips noted. No acute osseous abnormality. IMPRESSION: 1. Cardiomegaly, status post cardiac valve repair, and left chest pacemaker/AICD with leads overlying right atrium and right ventricle. 2. Diffuse interstitial prominence. 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  Recent thresholds reviewed: RA 0.6V @ 0.34ms, RV 0.9V @ 0.32ms   Episodes > 10/19/24 with frequent short bursts, largely lasting seconds up to one episode lasting 4 hours.  AM of 10/20/24 episode of AFL began 0330 and ongoing   Assessment/Plan:  Typical Atrial Flutter -recent GI illness, missed one dose of Coumadin   -INR 2.2  -not an ablation candidate given BMI -prior LVEF 55-60% in 12/2023, LA mildly dilated, no obs CAD on LHC 2018 -NPO x meds for now  -add dronedarone  400 mg BID > then plan for DCCV in ER, reviewed with ER MD -pt ok to return home after DCCV from EP standpoint  -Viktoriya Glaspy arrange for AF Clinic follow up   Secondary Hypercoagulable State  -continue coumadin   -note missed one dose with illness this week    For questions  or updates, please contact [facility] Please consult [email or web address] for contact info under     Signed, Daphne Barrack, NP-C, AGACNP-BC [facility] - Electrophysiology  10/20/2024, 9:17 AM    Dorthea KANDICE Hint was seen by me today along with Daphne Barrack. I have personally performed an evaluation on this patient.  My findings are as follows: 58 y.o. female with a past medical history as above.  She recently had a URI and a GI illness.  She missed the day of her medications due to her GI symptoms including Coumadin .  She went to the restroom yesterday and started feeling her heart racing.  Overall, she feels well, but did note fatigue and weakness.  She presented to the emergency room and was noted to be in atrial flutter.  Her rates are in the 120s to 130s.  She continues to have mild palpitations but otherwise is without major complaint.  Data: EKG(s) and pertinent labs, studies, etc were personally reviewed and interpreted by me:  EKG, labs Otherwise, I agree with data as outlined by the advanced practice provider.  Exam performed by me: Gen: Obese, no acute distress Neck: No JVD Cardiac: Tachycardic Lungs: Normal work of breathing Extremities: No edema  My Assessment and Plan:  1.  Typical atrial flutter: Patient had a recent GI illness as well as upper respiratory illness.  This may be the instigating cause of her atrial flutter.  Due to BMI, she is not a candidate for ablation.  Would start her Natolone 400 mg twice daily.  Amaziah Ghosh give a dose in the emergency room.  Would cardiovert in the emergency room.  Rivka Baune arrange for follow-up in clinic.  2.  Secondary hypercoagulable state: Continue Coumadin .  Signed,  Carzell Saldivar Gladis Norton, MD  10/20/2024 12:33 PM

## 2024-10-29 ENCOUNTER — Ambulatory Visit (HOSPITAL_COMMUNITY): Admit: 2024-10-29 | Discharge: 2024-10-29 | Attending: Internal Medicine | Admitting: Internal Medicine

## 2024-10-29 ENCOUNTER — Encounter (HOSPITAL_COMMUNITY): Payer: Self-pay | Admitting: Internal Medicine

## 2024-10-29 VITALS — BP 134/80 | HR 75 | Ht 61.0 in | Wt 329.2 lb

## 2024-10-29 DIAGNOSIS — Z79899 Other long term (current) drug therapy: Secondary | ICD-10-CM

## 2024-10-29 DIAGNOSIS — D6869 Other thrombophilia: Secondary | ICD-10-CM | POA: Diagnosis not present

## 2024-10-29 DIAGNOSIS — Z5181 Encounter for therapeutic drug level monitoring: Secondary | ICD-10-CM | POA: Diagnosis not present

## 2024-10-29 DIAGNOSIS — I483 Typical atrial flutter: Secondary | ICD-10-CM | POA: Diagnosis not present

## 2024-10-29 DIAGNOSIS — I4891 Unspecified atrial fibrillation: Secondary | ICD-10-CM

## 2024-10-29 DIAGNOSIS — I480 Paroxysmal atrial fibrillation: Secondary | ICD-10-CM | POA: Diagnosis not present

## 2024-10-29 DIAGNOSIS — I472 Ventricular tachycardia, unspecified: Secondary | ICD-10-CM

## 2024-10-29 MED ORDER — DRONEDARONE HCL 400 MG PO TABS: 400.0000 mg | ORAL_TABLET | Freq: Two times a day (BID) | ORAL | 6 refills | Status: AC

## 2024-10-29 NOTE — Progress Notes (Signed)
 Primary Care Physician: Fernand Tracey LABOR, MD Primary Cardiologist: Lamar Fitch, MD Electrophysiologist: Soyla Gladis Norton, MD     Referring Physician: Dr. Norton Cower Heather Mosley is a 58 y.o. female with a history of a HFrEF, PEA arrest s/p ICD, congenital AAS s/p aortic root/valve replacement with porcine valve and extended aortic arch, hemiarch replacement in 2006 developed AS and had subsequent TAVR 2015, hypertension, obesity, PE 2018, OSA on CPAP, and atrial flutter who presents for consultation in the [facility].  ED visit on 10/20/2024 due to palpitations with patient noting recent URI and GI illness.  Patient started on Multaq  400 mg twice daily and s/p successful DCCV in the ED.  Patient is on Coumadin  for stroke prevention.  On evaluation today, patient is currently in A paced rhythm.  She has been doing well since ED visit and has not reported any episodes of arrhythmia.  She has an Apple watch and has pretty consistently noted her heart rate to be in the mid 70s.  Today, she denies symptoms of chest pain, shortness of breath, orthopnea, PND, lower extremity edema, dizziness, presyncope, syncope, bleeding, or neurologic sequela. The patient is tolerating medications without difficulties and is otherwise without complaint today.   she has a BMI of Body mass index is 62.2 kg/m.SABRA Filed Weights   10/29/24 1434  Weight: (!) 149.3 kg    Current Outpatient Medications  Medication Sig Dispense Refill   acetaminophen  (TYLENOL ) 325 MG tablet Take 650 mg by mouth 2 (two) times daily as needed for moderate pain or headache.      aspirin  EC 81 MG tablet Take 81 mg by mouth daily.     atorvastatin  (LIPITOR) 80 MG tablet Take 1 tablet (80 mg total) by mouth daily. 90 tablet 1   dronedarone  (MULTAQ ) 400 MG tablet Take 1 tablet (400 mg total) by mouth 2 (two) times daily with a meal. 60 tablet 0   FUROSEMIDE  PO Take 20 mg by mouth as needed (fluid  retention).     Glucosamine 500 MG CAPS Take 2 capsules by mouth daily.     metoprolol  succinate (TOPROL -XL) 100 MG 24 hr tablet Take 1 tablet (100 mg total) by mouth in the morning and at bedtime. Take with or immediately following a meal. 180 tablet 3   mexiletine (MEXITIL ) 150 MG capsule Take 2 capsules (300 mg total) by mouth 2 (two) times daily. 360 capsule 2   nitroGLYCERIN  (NITROSTAT ) 0.4 MG SL tablet PLACE 1 TABLET UNDER THE TONGUE EVERY 5 MINUTES FOR 3 DOSES AS NEEDED FOR CHEST PAIN 25 tablet 11   potassium chloride  (KLOR-CON ) 10 MEQ tablet Take 1 tablet (10 mEq total) by mouth as needed. 90 tablet 1   warfarin (COUMADIN ) 5 MG tablet Take 2 tablets by mouth once daily or as directed by coumadin  clinic 200 tablet 1   Current Facility-Administered Medications  Medication Dose Route Frequency Provider Last Rate Last Admin   dronedarone  (MULTAQ ) tablet 400 mg  400 mg Oral BID WC         Atrial Fibrillation Management history:  Previous antiarrhythmic drugs: Multaq  Previous cardioversions: 10/20/2024 (ED) Previous ablations: None Anticoagulation history: Coumadin    ROS- All systems are reviewed and negative except as per the HPI above.  Physical Exam: BP 134/80   Pulse 75   Ht 5' 1 (1.549 m)   Wt (!) 149.3 kg   LMP 12/27/2016   BMI 62.20 kg/m   GEN:  Well nourished, well developed in no acute distress NECK: No JVD; No carotid bruits CARDIAC: Regular rate and rhythm, click noted; no rubs or gallops RESPIRATORY:  Clear to auscultation without rales, wheezing or rhonchi  ABDOMEN: Soft, non-tender, non-distended EXTREMITIES:  No edema; No deformity   EKG today demonstrates  EKG Interpretation Date/Time:  Wednesday October 29 2024 14:37:57 EST Ventricular Rate:  75 PR Interval:  252 QRS Duration:  84 QT Interval:  406 QTC Calculation: 453 R Axis:   72  Text Interpretation: Atrial-paced rhythm with prolonged AV conduction Nonspecific ST abnormality Abnormal ECG When  compared with ECG of 20-Oct-2024 10:54, PREVIOUS ECG IS PRESENT Confirmed by Terra Pac ([id number]) on 10/29/2024 2:40:36 PM    Echo 12/31/2023 demonstrated   1. Technically difficult study with suboptimal windows.   2. Left ventricular ejection fraction, by estimation, is 55 to 60%. The  left ventricle has normal function. Left ventricular endocardial border  not optimally defined to evaluate regional wall motion. Left ventricular  diastolic parameters are  indeterminate.   3. Right ventricular systolic function is normal. The right ventricular  size is normal. There is normal pulmonary artery systolic pressure. The  estimated right ventricular systolic pressure is 32.7 mmHg.   4. Left atrial size was mildly dilated.   5. The mitral valve was not well visualized. Mild mitral valve  regurgitation. No evidence of mitral stenosis.   6. The aortic valve was not well visualized. Known 21 mm Saint Jude  aortic valve prosthetic implanted 07-24-2017. Aortic valve regurgitation is  mild. Aortic valve area, by VTI measures 1.01 cm. Aortic valve mean  gradient measures 18.7 mmHg. Aortic valve   Vmax measures 2.92 m/s DI 0.26.   ASSESSMENT & PLAN CHA2DS2-VASc Score = 3  The patient's score is based upon: CHF History: 1 HTN History: 1 Diabetes History: 0 Stroke History: 0 Vascular Disease History: 0 Age Score: 0 Gender Score: 1       ASSESSMENT AND PLAN: Paroxysmal typical atrial flutter (ICD10:  I48.0)/ventricular tachycardia The patient's CHA2DS2-VASc score is 3, indicating a 3.2% annual risk of stroke.    Patient is currently in A paced rhythm. Continue Toprol  100 mg BID. She appears to be maintaining SR on current therapy. We will not make any changes at this time.   High risk medication monitoring (ICD10: [id number]) Patient requires ongoing monitoring for anti-arrhythmic medication which has the potential to cause life threatening arrhythmias or AV block. ECG intervals are  stable. Continue Multaq  400 mg twice daily. Continue mexiletine 300 mg twice a day.  Secondary Hypercoagulable State (ICD10:  D68.69) The patient is at significant risk for stroke/thromboembolism based upon her CHA2DS2-VASc Score of 3.  Continue Warfarin (Coumadin ).   Continue coumadin  as directed.     Follow up as scheduled with EP.   Terra Pac, [facility]  Afib Clinic [address] [place], [place] [address] [phone]

## 2024-11-04 ENCOUNTER — Ambulatory Visit: Attending: Cardiology

## 2024-11-04 DIAGNOSIS — Z952 Presence of prosthetic heart valve: Secondary | ICD-10-CM

## 2024-11-04 DIAGNOSIS — Z5181 Encounter for therapeutic drug level monitoring: Secondary | ICD-10-CM | POA: Diagnosis not present

## 2024-11-04 DIAGNOSIS — Z86711 Personal history of pulmonary embolism: Secondary | ICD-10-CM | POA: Diagnosis not present

## 2024-11-04 LAB — POCT INR: INR: 2.8 (ref 2.0–3.0)

## 2024-11-04 NOTE — Patient Instructions (Signed)
 Continue taking 2 tablets daily.  Recheck INR in 6 weeks at the [place] office.  Please call the office with any medication changes or additions [phone]

## 2024-11-25 ENCOUNTER — Other Ambulatory Visit: Payer: Self-pay | Admitting: Cardiology

## 2024-11-25 ENCOUNTER — Telehealth: Payer: Self-pay | Admitting: Cardiology

## 2024-11-25 NOTE — Telephone Encounter (Signed)
" °*  STAT* If patient is at the pharmacy, call can be transferred to refill team.   1. Which medications need to be refilled? (please list name of each medication and dose if known) mexiletine (MEXITIL ) 150 MG capsule    2. Would you like to learn more about the convenience, safety, & potential cost savings by using the [facility]? No    3. Are you open to using the [facility] (Type [facility] No   4. Which pharmacy/location (including street and city if local pharmacy) is medication to be sent to?[facility] [phone] - [place], [place] - [address]    5. Do they need a 30 day or 90 day supply? 90 day    Pt states pharmacy is unable to refill medication until the 17th but she will be out on the 15th.  "

## 2024-11-27 ENCOUNTER — Ambulatory Visit (INDEPENDENT_AMBULATORY_CARE_PROVIDER_SITE_OTHER): Payer: Self-pay

## 2024-11-27 DIAGNOSIS — I472 Ventricular tachycardia, unspecified: Secondary | ICD-10-CM | POA: Diagnosis not present

## 2024-11-27 LAB — CUP PACEART REMOTE DEVICE CHECK
Battery Remaining Longevity: 48 mo
Battery Remaining Percentage: 54 %
Brady Statistic RA Percent Paced: 71 %
Brady Statistic RV Percent Paced: 2 %
Date Time Interrogation Session: 20260115075800
HighPow Impedance: 85 Ohm
Implantable Lead Connection Status: 753985
Implantable Lead Connection Status: 753985
Implantable Lead Implant Date: 20151228
Implantable Lead Implant Date: 20151228
Implantable Lead Location: 753859
Implantable Lead Location: 753860
Implantable Lead Model: 292
Implantable Lead Model: 5076
Implantable Lead Serial Number: 358765
Implantable Pulse Generator Implant Date: 20151228
Lead Channel Impedance Value: 468 Ohm
Lead Channel Impedance Value: 497 Ohm
Lead Channel Pacing Threshold Amplitude: 0.6 V
Lead Channel Pacing Threshold Amplitude: 0.9 V
Lead Channel Pacing Threshold Pulse Width: 0.4 ms
Lead Channel Pacing Threshold Pulse Width: 0.4 ms
Lead Channel Setting Pacing Amplitude: 2 V
Lead Channel Setting Pacing Amplitude: 2.4 V
Lead Channel Setting Pacing Pulse Width: 0.4 ms
Lead Channel Setting Sensing Sensitivity: 0.5 mV
Pulse Gen Serial Number: 508420
Zone Setting Status: 755011

## 2024-11-27 NOTE — Telephone Encounter (Signed)
 Placed call to pt just to confirm she has received her refill that was placed 11/26/24.  Pt confirms that her pharmacy is getting it ready now and thanked me for following up with her.

## 2024-11-28 ENCOUNTER — Ambulatory Visit: Payer: Self-pay | Admitting: Cardiology

## 2024-12-04 NOTE — Progress Notes (Signed)
 Remote ICD Transmission

## 2024-12-16 ENCOUNTER — Ambulatory Visit

## 2024-12-16 DIAGNOSIS — Z952 Presence of prosthetic heart valve: Secondary | ICD-10-CM

## 2024-12-16 DIAGNOSIS — Z5181 Encounter for therapeutic drug level monitoring: Secondary | ICD-10-CM

## 2024-12-16 DIAGNOSIS — Z86711 Personal history of pulmonary embolism: Secondary | ICD-10-CM

## 2024-12-16 LAB — POCT INR: INR: 3.2 — AB (ref 2.0–3.0)

## 2024-12-16 NOTE — Patient Instructions (Signed)
 Continue taking 2 tablets daily. Eat greens tonight. Recheck INR in 6 weeks at the [place] office.  Please call the office with any medication changes or additions [phone].

## 2024-12-31 ENCOUNTER — Ambulatory Visit: Payer: Self-pay

## 2025-01-06 ENCOUNTER — Ambulatory Visit: Payer: Self-pay | Admitting: Cardiology

## 2025-01-27 ENCOUNTER — Ambulatory Visit (HOSPITAL_COMMUNITY): Admitting: Internal Medicine

## 2025-01-27 ENCOUNTER — Ambulatory Visit

## 2025-02-26 ENCOUNTER — Ambulatory Visit: Payer: Self-pay

## 2025-04-29 ENCOUNTER — Ambulatory Visit (HOSPITAL_COMMUNITY): Admitting: Internal Medicine

## 2025-05-28 ENCOUNTER — Ambulatory Visit: Payer: Self-pay

## 2025-08-27 ENCOUNTER — Ambulatory Visit: Payer: Self-pay
